# Patient Record
Sex: Female | Born: 1950 | Race: White | Hispanic: No | Marital: Married | Smoking: Never smoker
Health system: Southern US, Community
[De-identification: ages and names within clinical notes are randomized; demographics above are authoritative.]

## PROBLEM LIST (undated history)

## (undated) DIAGNOSIS — C801 Malignant (primary) neoplasm, unspecified: Secondary | ICD-10-CM

## (undated) DIAGNOSIS — M5416 Radiculopathy, lumbar region: Secondary | ICD-10-CM

## (undated) DIAGNOSIS — F329 Major depressive disorder, single episode, unspecified: Secondary | ICD-10-CM

## (undated) DIAGNOSIS — C8190 Hodgkin lymphoma, unspecified, unspecified site: Secondary | ICD-10-CM

## (undated) DIAGNOSIS — M1990 Unspecified osteoarthritis, unspecified site: Secondary | ICD-10-CM

## (undated) DIAGNOSIS — N951 Menopausal and female climacteric states: Secondary | ICD-10-CM

## (undated) DIAGNOSIS — I471 Supraventricular tachycardia, unspecified: Secondary | ICD-10-CM

## (undated) DIAGNOSIS — F5104 Psychophysiologic insomnia: Secondary | ICD-10-CM

## (undated) DIAGNOSIS — I700 Atherosclerosis of aorta: Secondary | ICD-10-CM

## (undated) DIAGNOSIS — K5792 Diverticulitis of intestine, part unspecified, without perforation or abscess without bleeding: Secondary | ICD-10-CM

## (undated) DIAGNOSIS — H269 Unspecified cataract: Secondary | ICD-10-CM

## (undated) DIAGNOSIS — E079 Disorder of thyroid, unspecified: Secondary | ICD-10-CM

## (undated) DIAGNOSIS — E039 Hypothyroidism, unspecified: Secondary | ICD-10-CM

## (undated) DIAGNOSIS — J302 Other seasonal allergic rhinitis: Secondary | ICD-10-CM

## (undated) DIAGNOSIS — G4730 Sleep apnea, unspecified: Secondary | ICD-10-CM

## (undated) DIAGNOSIS — J189 Pneumonia, unspecified organism: Secondary | ICD-10-CM

## (undated) DIAGNOSIS — F411 Generalized anxiety disorder: Secondary | ICD-10-CM

## (undated) DIAGNOSIS — K219 Gastro-esophageal reflux disease without esophagitis: Secondary | ICD-10-CM

## (undated) DIAGNOSIS — R002 Palpitations: Secondary | ICD-10-CM

## (undated) DIAGNOSIS — G4733 Obstructive sleep apnea (adult) (pediatric): Secondary | ICD-10-CM

## (undated) DIAGNOSIS — F419 Anxiety disorder, unspecified: Secondary | ICD-10-CM

## (undated) DIAGNOSIS — C439 Malignant melanoma of skin, unspecified: Secondary | ICD-10-CM

## (undated) DIAGNOSIS — J45909 Unspecified asthma, uncomplicated: Secondary | ICD-10-CM

## (undated) DIAGNOSIS — T8859XA Other complications of anesthesia, initial encounter: Secondary | ICD-10-CM

## (undated) DIAGNOSIS — N3281 Overactive bladder: Secondary | ICD-10-CM

## (undated) DIAGNOSIS — F32A Depression, unspecified: Secondary | ICD-10-CM

## (undated) DIAGNOSIS — K589 Irritable bowel syndrome without diarrhea: Secondary | ICD-10-CM

## (undated) DIAGNOSIS — R202 Paresthesia of skin: Secondary | ICD-10-CM

## (undated) DIAGNOSIS — F909 Attention-deficit hyperactivity disorder, unspecified type: Secondary | ICD-10-CM

## (undated) DIAGNOSIS — E785 Hyperlipidemia, unspecified: Secondary | ICD-10-CM

## (undated) DIAGNOSIS — C50912 Malignant neoplasm of unspecified site of left female breast: Secondary | ICD-10-CM

## (undated) DIAGNOSIS — K5909 Other constipation: Secondary | ICD-10-CM

## (undated) DIAGNOSIS — I10 Essential (primary) hypertension: Secondary | ICD-10-CM

## (undated) HISTORY — PX: COLON SURGERY: SHX602

## (undated) HISTORY — PX: CLEFT LIP REPAIR: SUR1164

## (undated) HISTORY — DX: Overactive bladder: N32.81

## (undated) HISTORY — PX: CARPAL TUNNEL RELEASE: SHX101

## (undated) HISTORY — PX: DE QUERVAIN'S RELEASE: SHX1439

## (undated) HISTORY — PX: NASAL SINUS SURGERY: SHX719

## (undated) HISTORY — PX: FINGER SURGERY: SHX640

## (undated) HISTORY — PX: CATARACT EXTRACTION: SUR2

## (undated) HISTORY — DX: Hodgkin lymphoma, unspecified, unspecified site: C81.90

## (undated) HISTORY — PX: BACK SURGERY: SHX140

## (undated) HISTORY — PX: JOINT REPLACEMENT: SHX530

## (undated) HISTORY — PX: BREAST BIOPSY: SHX20

## (undated) HISTORY — PX: EYE SURGERY: SHX253

## (undated) HISTORY — PX: OTHER SURGICAL HISTORY: SHX169

## (undated) HISTORY — PX: BLEPHAROPLASTY: SUR158

## (undated) HISTORY — PX: DIAGNOSTIC LAPAROSCOPY: SUR761

## (undated) HISTORY — DX: Menopausal and female climacteric states: N95.1

---

## 2004-05-02 DIAGNOSIS — C8190 Hodgkin lymphoma, unspecified, unspecified site: Secondary | ICD-10-CM

## 2004-05-02 HISTORY — DX: Hodgkin lymphoma, unspecified, unspecified site: C81.90

## 2013-05-02 HISTORY — PX: CATARACT EXTRACTION: SUR2

## 2017-01-30 ENCOUNTER — Emergency Department
Admission: EM | Admit: 2017-01-30 | Discharge: 2017-01-30 | Disposition: A | Discharge status: Home / Self Care | Payer: TRICARE For Life (TFL) | Attending: Student in an Organized Health Care Education/Training Program | Admitting: Student in an Organized Health Care Education/Training Program

## 2017-01-30 ENCOUNTER — Encounter: Payer: Self-pay | Admitting: Emergency Medicine

## 2017-01-30 ENCOUNTER — Emergency Department: Payer: TRICARE For Life (TFL)

## 2017-01-30 DIAGNOSIS — R079 Chest pain, unspecified: Secondary | ICD-10-CM

## 2017-01-30 DIAGNOSIS — J45909 Unspecified asthma, uncomplicated: Secondary | ICD-10-CM | POA: Diagnosis not present

## 2017-01-30 DIAGNOSIS — R590 Localized enlarged lymph nodes: Secondary | ICD-10-CM

## 2017-01-30 DIAGNOSIS — Z8571 Personal history of Hodgkin lymphoma: Secondary | ICD-10-CM | POA: Insufficient documentation

## 2017-01-30 DIAGNOSIS — E039 Hypothyroidism, unspecified: Secondary | ICD-10-CM | POA: Diagnosis not present

## 2017-01-30 HISTORY — DX: Gastro-esophageal reflux disease without esophagitis: K21.9

## 2017-01-30 HISTORY — DX: Disorder of thyroid, unspecified: E07.9

## 2017-01-30 HISTORY — DX: Major depressive disorder, single episode, unspecified: F32.9

## 2017-01-30 HISTORY — DX: Other seasonal allergic rhinitis: J30.2

## 2017-01-30 HISTORY — DX: Depression, unspecified: F32.A

## 2017-01-30 HISTORY — DX: Malignant (primary) neoplasm, unspecified: C80.1

## 2017-01-30 HISTORY — DX: Unspecified asthma, uncomplicated: J45.909

## 2017-01-30 LAB — BASIC METABOLIC PANEL
Anion gap: 10 (ref 5–15)
BUN: 10 mg/dL (ref 6–20)
CHLORIDE: 99 mmol/L — AB (ref 101–111)
CO2: 27 mmol/L (ref 22–32)
Calcium: 9.5 mg/dL (ref 8.9–10.3)
Creatinine, Ser: 0.93 mg/dL (ref 0.44–1.00)
GFR calc non Af Amer: >60 mL/min (ref >60)
Glucose, Bld: 101 mg/dL — ABNORMAL HIGH (ref 65–99)
POTASSIUM: 4.1 mmol/L (ref 3.5–5.1)
SODIUM: 136 mmol/L (ref 135–145)

## 2017-01-30 LAB — CBC
HEMATOCRIT: 39.4 % (ref 35.0–47.0)
Hemoglobin: 13.4 g/dL (ref 12.0–16.0)
MCH: 31.1 pg (ref 26.0–34.0)
MCHC: 34 g/dL (ref 32.0–36.0)
MCV: 91.3 fL (ref 80.0–100.0)
Platelets: 318 10*3/uL (ref 150–440)
RBC: 4.31 MIL/uL (ref 3.80–5.20)
RDW: 14.6 % — ABNORMAL HIGH (ref 11.5–14.5)
WBC: 6 10*3/uL (ref 3.6–11.0)

## 2017-01-30 LAB — TROPONIN I
Troponin I: <0.03 ng/mL (ref <0.03)
Troponin I: <0.03 ng/mL (ref <0.03)

## 2017-01-30 MED ORDER — IPRATROPIUM-ALBUTEROL 0.5-2.5 (3) MG/3ML IN SOLN
3.0000 mL | Freq: Once | RESPIRATORY_TRACT | Status: AC
  Administered 2017-01-30: 3 mL via RESPIRATORY_TRACT
  Filled 2017-01-30: qty 3

## 2017-01-30 MED ORDER — DEXAMETHASONE SODIUM PHOSPHATE 10 MG/ML IJ SOLN
10.0000 mg | Freq: Once | INTRAMUSCULAR | Status: AC
  Administered 2017-01-30: 10 mg via INTRAVENOUS
  Filled 2017-01-30: qty 1

## 2017-01-30 MED ORDER — SODIUM CHLORIDE 0.9 % IV BOLUS (SEPSIS)
1000.0000 mL | Freq: Once | INTRAVENOUS | Status: AC
  Administered 2017-01-30: 1000 mL via INTRAVENOUS

## 2017-01-30 MED ORDER — NYSTATIN 100000 UNIT/ML MT SUSP: 5.0000 mL | Freq: Four times a day (QID) | OROMUCOSAL | 0 refills | Status: DC

## 2017-01-30 MED ORDER — AMOXICILLIN-POT CLAVULANATE 875-125 MG PO TABS: 1.0000 | ORAL_TABLET | Freq: Two times a day (BID) | ORAL | 0 refills | Status: AC

## 2017-01-30 MED ORDER — GI COCKTAIL ~~LOC~~
30.0000 mL | Freq: Once | ORAL | Status: AC
  Administered 2017-01-30: 30 mL via ORAL
  Filled 2017-01-30: qty 30

## 2017-01-30 MED ORDER — FLUCONAZOLE 150 MG PO TABS: 150.0000 mg | ORAL_TABLET | Freq: Every day | ORAL | 0 refills | Status: AC

## 2017-01-30 MED ORDER — IOPAMIDOL (ISOVUE-370) INJECTION 76%
75.0000 mL | Freq: Once | INTRAVENOUS | Status: AC | PRN
  Administered 2017-01-30: 75 mL via INTRAVENOUS

## 2017-01-30 NOTE — ED Notes (Signed)

## 2017-01-30 NOTE — ED Provider Notes (Signed)
[facility] Emergency Department Provider Note    First MD Initiated Contact with Patient 01/30/17 1731     (approximate)  I have reviewed the triage vital signs and the nursing notes.   HISTORY  Chief Complaint Chest Pain    HPI Andrea Noble is a 66 y.o. female with a history of asthma as well as Hodgkin's lymphoma status post no biopsy and resection presents with chief complaint of left jaw pain and chest pain and left arm pain since yesterday.denies any nausea or vomiting. No abdominal pain. No extremity edema. No personal history of heart attack or DVTs. States that she had a Scientific laboratory technician send stress test 2 years ago which was negative. States she has had SVT in the past but this feels different. Denies any sore throat earache blurry vision or pain radiating through to her back.   Past Medical History:  Diagnosis Date  . Asthma   . Cancer ([place])    hodgkins lymphoma  . Depression   . GERD (gastroesophageal reflux disease)   . Seasonal allergies   . Thyroid disease    hypothyroid   No family history on file. Past Surgical History:  Procedure Laterality Date  . CATARACT EXTRACTION Bilateral    There are no active problems to display for this patient.     Prior to Admission medications   Medication Sig Start Date End Date Taking? Authorizing Provider  amoxicillin-clavulanate (AUGMENTIN) 875-125 MG tablet Take 1 tablet by mouth 2 (two) times daily. 01/30/17 02/06/17  Merlyn Lot, MD  fluconazole (DIFLUCAN) 150 MG tablet Take 1 tablet (150 mg total) by mouth daily. 01/30/17 01/31/17  Merlyn Lot, MD  nystatin (MYCOSTATIN) 100000 UNIT/ML suspension Take 5 mLs (500,000 Units total) by mouth 4 (four) times daily. 01/30/17   Merlyn Lot, MD    Allergies Levofloxacin    Social History Social History  Substance Use Topics  . Smoking status: Never Smoker  . Smokeless tobacco: Never Used  . Alcohol use No    Review of  Systems Patient denies headaches, rhinorrhea, blurry vision, numbness, shortness of breath, chest pain, edema, cough, abdominal pain, nausea, vomiting, diarrhea, dysuria, fevers, rashes or hallucinations unless otherwise stated above in HPI. ____________________________________________   PHYSICAL EXAM:  VITAL SIGNS: Vitals:   01/30/17 1353  BP: (!) 160/74  Pulse: (!) 102  Resp: 18  Temp: 98.1 F (36.7 C)  SpO2: 98%    Constitutional: Alert and oriented. Well appearing and in no acute distress. Eyes: Conjunctivae are normal.  Head: Atraumatic. Nose: No congestion/rhinnorhea. Mouth/Throat: Mucous membranes are moist.   Neck: No stridor. Painless ROM. Lymphadenopathy of left anterior cervical chain Cardiovascular: Normal rate, regular rhythm. Grossly normal heart sounds.  Good peripheral circulation. Respiratory: Normal respiratory effort.  No retractions. Lungs CTAB. Gastrointestinal: Soft and nontender. No distention. No abdominal bruits. No CVA tenderness. Genitourinary:  Musculoskeletal: No lower extremity tenderness nor edema.  No joint effusions. Neurologic:  Normal speech and language. No gross focal neurologic deficits are appreciated. No facial droop Skin:  Skin is warm, dry and intact. No rash noted. Psychiatric: Mood and affect are normal. Speech and behavior are normal.  ____________________________________________   LABS (all labs ordered are listed, but only abnormal results are displayed)  Results for orders placed or performed during the hospital encounter of 01/30/17 (from the past 24 hour(s))  Basic metabolic panel     Status: Abnormal   Collection Time: 01/30/17  1:52 PM  Result Value Ref Range  Sodium 136 135 - 145 mmol/L   Potassium 4.1 3.5 - 5.1 mmol/L   Chloride 99 (L) 101 - 111 mmol/L   CO2 27 22 - 32 mmol/L   Glucose, Bld 101 (H) 65 - 99 mg/dL   BUN 10 6 - 20 mg/dL   Creatinine, Ser 0.93 0.44 - 1.00 mg/dL   Calcium 9.5 8.9 - 10.3 mg/dL   GFR  calc non Af Amer >60 >60 mL/min   GFR calc Af Amer >60 >60 mL/min   Anion gap 10 5 - 15  CBC     Status: Abnormal   Collection Time: 01/30/17  1:52 PM  Result Value Ref Range   WBC 6.0 3.6 - 11.0 K/uL   RBC 4.31 3.80 - 5.20 MIL/uL   Hemoglobin 13.4 12.0 - 16.0 g/dL   HCT 39.4 35.0 - 47.0 %   MCV 91.3 80.0 - 100.0 fL   MCH 31.1 26.0 - 34.0 pg   MCHC 34.0 32.0 - 36.0 g/dL   RDW 14.6 (H) 11.5 - 14.5 %   Platelets 318 150 - 440 K/uL  Troponin I     Status: None   Collection Time: 01/30/17  1:52 PM  Result Value Ref Range   Troponin I <0.03 <0.03 ng/mL  Troponin I     Status: None   Collection Time: 01/30/17  6:20 PM  Result Value Ref Range   Troponin I <0.03 <0.03 ng/mL   ____________________________________________  EKG My review and personal interpretation at Time: 13:41   Indication: chest pain, n eck pain,  Rate: 110  Rhythm: sinus Axis: normal Other: normal intervals, no stemi, no depressions ____________________________________________  RADIOLOGY  I personally reviewed all radiographic images ordered to evaluate for the above acute complaints and reviewed radiology reports and findings.  These findings were personally discussed with the patient.  Please see medical record for radiology report.  ____________________________________________   PROCEDURES  Procedure(s) performed:  Procedures    Critical Care performed: no ____________________________________________   INITIAL IMPRESSION / ASSESSMENT AND PLAN / ED COURSE  Pertinent labs & imaging results that were available during my care of the patient were reviewed by me and considered in my medical decision making (see chart for details).  DDX: ACS, pericarditis, esophagitis, boerhaaves, pe, dissection, pna, bronchitis, costochondritis   Andrea Noble is a 66 y.o. who presents to the ED with shortness of breath, chest pain and arm pain as described above. EKG shows no evidence of ischemia. Her troponin is  negative. heart score is to go further risk stratify with repeat troponin. CT angiogram will be ordered due to concern for pulmonary and was MRSA dissection based on her high risk features. The lower clinical suspicion for dissection based on duration of the events and her well appearance.  seems less consistent with pneumonia but could have component of asthma versus bronchitis.  The patient will be placed on continuous pulse oximetry and telemetry for monitoring.  Laboratory evaluation will be sent to evaluate for the above complaints.     Clinical Course as of Jan 31 2012  Mon Jan 30, 2017  1959 patient reassessed and is chest pain-free. Repeat troponin is negative. At this point do not feel this is representative ACS dissection or acute intrathoracic process requiring admission to hospital. I did offer admission to hospital for further risk stratification and observation of chest pain patient has declined this or prefer to be referred as does an outpatient. On reassessment she is complaining of some tender adenopathy  in the left anterior neck which is also where she previously had a history of lymphoma. There is no evidence of infectious process right now. I will give her referral to heme on for further surveillance given her history of lymphoma. Patient states that she did feel improvement in her symptoms after  [PR]    Clinical Course User Index [PR] Merlyn Lot, MD     ____________________________________________   FINAL CLINICAL IMPRESSION(S) / ED DIAGNOSES  Final diagnoses:  Nonspecific chest pain  Lymphadenopathy of left cervical region      NEW MEDICATIONS STARTED DURING THIS VISIT:  New Prescriptions   AMOXICILLIN-CLAVULANATE (AUGMENTIN) 875-125 MG TABLET    Take 1 tablet by mouth 2 (two) times daily.   FLUCONAZOLE (DIFLUCAN) 150 MG TABLET    Take 1 tablet (150 mg total) by mouth daily.   NYSTATIN (MYCOSTATIN) 100000 UNIT/ML SUSPENSION    Take 5 mLs (500,000 Units  total) by mouth 4 (four) times daily.     Note:  This document was prepared using Dragon voice recognition software and may include unintentional dictation errors.    Merlyn Lot, MD 01/30/17 2013

## 2017-01-30 NOTE — ED Notes (Signed)
Unable to obtain E-signature; E-sign pad in the room is not working; hard copy printed and signed by patient.

## 2017-01-30 NOTE — ED Triage Notes (Signed)
C/O mid chest pain radiating to left jaw and left arm x 1 day.  Patient states pain was worse last night, but today dull pain persists.  Patient ahs had SVT in the past and has + familial history of heart disease.

## 2017-01-30 NOTE — ED Notes (Signed)
Pt went to CT

## 2017-03-01 DIAGNOSIS — Z8571 Personal history of Hodgkin lymphoma: Secondary | ICD-10-CM | POA: Insufficient documentation

## 2017-03-01 DIAGNOSIS — Z87898 Personal history of other specified conditions: Secondary | ICD-10-CM | POA: Insufficient documentation

## 2017-03-01 NOTE — Progress Notes (Signed)
[place]  [phone] [phone]) [phone]  ID: [name] OB: [birth year]  MR#: [id number]  JEH#:[id number]  Patient Care Team: Glendon Axe, MD as PCP - General (Internal Medicine)  CHIEF COMPLAINT: History of Hodgkin's lymphoma.  INTERVAL HISTORY: Patient is a 66 year old female who was diagnosed with Hodgkin's lymphoma from a neck lymph node biopsy in December 2006.  She received chemotherapy with ABVD starting in December 2006 and completing in March 2007.  She also had adjuvant XRT in May and June 2007.  She had multiple PET scans after revealing complete remission.  She recently moved to [place] from [place] where all of her treatments and imaging took place.  She was in the emergency room with chest pain and shortness of breath, but  PE protocol CT did not reveal distinct etiology.  She admits to night sweats, but otherwise feels well.  She has no neurologic complaints.  She denies any recent fevers or illnesses.  She has a good appetite and denies weight loss.  She has noted no new lymphadenopathy.  She has no further chest pain or shortness of breath.  She denies any nausea, vomiting, constipation, or diarrhea.  She has no urinary complaints.  Patient otherwise feels well and offers no further specific complaints today.  REVIEW OF SYSTEMS:   Review of Systems  Constitutional: Positive for diaphoresis. Negative for fever, malaise/fatigue and weight loss.  Respiratory: Negative.  Negative for cough and shortness of breath.   Cardiovascular: Negative.  Negative for chest pain and leg swelling.  Gastrointestinal: Negative.  Negative for abdominal pain, blood in stool and melena.  Genitourinary: Negative.   Musculoskeletal: Negative.   Skin: Negative.  Negative for rash.  Neurological: Negative.  Negative for sensory change and weakness.  Endo/Heme/Allergies: Negative.  Does not bruise/bleed easily.  Psychiatric/Behavioral: Negative.  The  patient is not nervous/anxious.     As per HPI. Otherwise, a complete review of systems is negative.  PAST MEDICAL HISTORY: Past Medical History:  Diagnosis Date  . Asthma   . Cancer ([place])    hodgkins lymphoma  . Depression   . GERD (gastroesophageal reflux disease)   . Seasonal allergies   . Thyroid disease    hypothyroid    PAST SURGICAL HISTORY: Past Surgical History:  Procedure Laterality Date  . CATARACT EXTRACTION Bilateral     FAMILY HISTORY: No family history on file.  ADVANCED DIRECTIVES (Y/N):  N  HEALTH MAINTENANCE: Social History   Tobacco Use  . Smoking status: Never Smoker  . Smokeless tobacco: Never Used  Substance Use Topics  . Alcohol use: No  . Drug use: No     Colonoscopy:  PAP:  Bone density:  Lipid panel:  Allergies  Allergen Reactions  . Levofloxacin     IV Levaquin causes burning  . Other Itching    Surgical glue    Current Outpatient Medications  Medication Sig Dispense Refill  . ADVAIR DISKUS 100-50 MCG/DOSE AEPB     . albuterol (PROAIR HFA) 108 (90 Base) MCG/ACT inhaler ProAir HFA 90 mcg/actuation aerosol inhaler   2 puffs as needed by inhalation route.    Marland Kitchen buPROPion (WELLBUTRIN SR) 200 MG 12 hr tablet bupropion HCl SR 200 mg tablet,12 hr sustained-release    . calcium-vitamin D (OSCAL WITH D) 250-125 MG-UNIT tablet Take 1 tablet daily by mouth.    . conjugated estrogens (PREMARIN) vaginal cream Premarin 0.625 mg/gram vaginal cream  PRN    . diflorasone-emollient (APEXICON E) 0.05 % CREA  ApexiCon E 0.05 % topical cream    . DOCOSAHEXAENOIC ACID PO Take by mouth.    . doxycycline (VIBRAMYCIN) 100 MG capsule Take 100 mg 2 (two) times daily by mouth.    . fluocinonide cream (LIDEX) 0.05 % fluocinonide 0.05 % topical cream    . fluticasone (FLONASE) 50 MCG/ACT nasal spray Flonase Allergy Relief 50 mcg/actuation nasal spray,suspension   2 sprays every day by nasal route.    . gabapentin (NEURONTIN) 100 MG capsule gabapentin 100  mg capsule    . Inulin 2 g CHEW Fiber Gummies 2 gram chewable tablet   1 tablet every day by oral route.    Marland Kitchen levothyroxine (SYNTHROID, LEVOTHROID) 50 MCG tablet levothyroxine 50 mcg tablet    . Meclizine HCl 25 MG CHEW Meclizine Hcl Chewable 25 mg tablet   1 tablet as needed by oral route.    . montelukast (SINGULAIR) 10 MG tablet montelukast 10 mg tablet    . Multiple Vitamin (MULTIVITAMIN) tablet Take 1 tablet daily by mouth.    . pantoprazole (PROTONIX) 40 MG tablet pantoprazole 40 mg tablet,delayed release  DAILY    . pravastatin (PRAVACHOL) 40 MG tablet pravastatin 40 mg tablet    . PREMPRO 0.45-1.5 MG tablet     . Probiotic Product (SOLUBLE FIBER/PROBIOTICS PO) Take by mouth.    . tetrahydrozoline-zinc (VISINE-AC) 0.05-0.25 % ophthalmic solution Allergy Relief Eye Drops 0.05 %-0.25 %   1 drop every day by ophthalmic route.    . traZODone (DESYREL) 50 MG tablet trazodone 50 mg tablet  DAILY    . Triprolidine-Pseudoephedrine (ANTIHISTAMINE PO) Take by mouth.     No current facility-administered medications for this visit.     OBJECTIVE: Vitals:   03/06/17 1604  BP: (!) 147/86  Pulse: (!) 111  Resp: 20  Temp: (!) 97.4 F (36.3 C)     Body mass index is 25.58 kg/m.    ECOG FS:0 - Asymptomatic  General: Well-developed, well-nourished, no acute distress. Eyes: Pink conjunctiva, anicteric sclera. HEENT: Normocephalic, moist mucous membranes, clear oropharnyx. Lungs: Clear to auscultation bilaterally. Heart: Regular rate and rhythm. No rubs, murmurs, or gallops. Abdomen: Soft, nontender, nondistended. No organomegaly noted, normoactive bowel sounds. Musculoskeletal: No edema, cyanosis, or clubbing. Neuro: Alert, answering all questions appropriately. Cranial nerves grossly intact. Skin: No rashes or petechiae noted. Psych: Normal affect. Lymphatics: No cervical, calvicular, axillary or inguinal LAD.   LAB RESULTS:  Lab Results  Component Value Date   NA 136 01/30/2017    K 4.1 01/30/2017   CL 99 (L) 01/30/2017   CO2 27 01/30/2017   GLUCOSE 101 (H) 01/30/2017   BUN 10 01/30/2017   CREATININE 0.93 01/30/2017   CALCIUM 9.5 01/30/2017   GFRNONAA >60 01/30/2017   GFRAA >60 01/30/2017    Lab Results  Component Value Date   WBC 6.6 03/06/2017   NEUTROABS 4.4 03/06/2017   HGB 12.9 03/06/2017   HCT 38.1 03/06/2017   MCV 91.8 03/06/2017   PLT 323 03/06/2017     STUDIES: No results found.  ASSESSMENT: History of Hodgkin's lymphoma.  PLAN:    1. History of Hodgkin's lymphoma: Patient was initially diagnosed with Hodgkin's lymphoma from a neck lymph node biopsy in December 2006.  Possibly stage II.  She received chemotherapy with ABVD starting in December 2006 and completing in March 2007.  She also had adjuvant XRT in May and June 2007.  She had multiple PET scans after completing her treatments revealing complete remission. She recently moved to  [place] from [place] where all of her treatments and imaging took place.  CT scan of the chest completed in the emergency room did not reveal any evidence of recurrence.  Will get a CT of her neck, abdomen and pelvis for completeness.  Patient will follow up 1-2 days after her imaging to discuss the results.  If the results are negative, she likely can be discharged from clinic.  Approximately 45 minutes was spent in discussion of which greater than 50% was consultation.  Patient expressed understanding and was in agreement with this plan. She also understands that She can call clinic at any time with any questions, concerns, or complaints.   Cancer Staging No matching staging information was found for the patient.  Lloyd Huger, MD   03/06/2017 4:08 PM

## 2017-03-02 ENCOUNTER — Other Ambulatory Visit: Payer: Self-pay | Admitting: Oncology

## 2017-03-02 ENCOUNTER — Ambulatory Visit
Admission: RE | Admit: 2017-03-02 | Discharge: 2017-03-02 | Disposition: A | Discharge status: Home / Self Care | Payer: Self-pay | Source: Ambulatory Visit | Attending: Oncology | Admitting: Oncology

## 2017-03-02 DIAGNOSIS — C8190 Hodgkin lymphoma, unspecified, unspecified site: Secondary | ICD-10-CM

## 2017-03-06 ENCOUNTER — Inpatient Hospital Stay: Payer: TRICARE For Life (TFL) | Attending: Oncology | Admitting: Oncology

## 2017-03-06 ENCOUNTER — Inpatient Hospital Stay: Payer: TRICARE For Life (TFL)

## 2017-03-06 VITALS — BP 147/86 | HR 111 | Temp 97.4°F | Resp 20 | Wt 149.0 lb

## 2017-03-06 DIAGNOSIS — Z923 Personal history of irradiation: Secondary | ICD-10-CM | POA: Diagnosis not present

## 2017-03-06 DIAGNOSIS — I700 Atherosclerosis of aorta: Secondary | ICD-10-CM | POA: Diagnosis not present

## 2017-03-06 DIAGNOSIS — Z9221 Personal history of antineoplastic chemotherapy: Secondary | ICD-10-CM

## 2017-03-06 DIAGNOSIS — M542 Cervicalgia: Secondary | ICD-10-CM | POA: Diagnosis not present

## 2017-03-06 DIAGNOSIS — R1310 Dysphagia, unspecified: Secondary | ICD-10-CM | POA: Insufficient documentation

## 2017-03-06 DIAGNOSIS — M47816 Spondylosis without myelopathy or radiculopathy, lumbar region: Secondary | ICD-10-CM | POA: Diagnosis not present

## 2017-03-06 DIAGNOSIS — K219 Gastro-esophageal reflux disease without esophagitis: Secondary | ICD-10-CM

## 2017-03-06 DIAGNOSIS — F329 Major depressive disorder, single episode, unspecified: Secondary | ICD-10-CM

## 2017-03-06 DIAGNOSIS — Z8571 Personal history of Hodgkin lymphoma: Secondary | ICD-10-CM | POA: Diagnosis not present

## 2017-03-06 DIAGNOSIS — Z79899 Other long term (current) drug therapy: Secondary | ICD-10-CM

## 2017-03-06 DIAGNOSIS — J45909 Unspecified asthma, uncomplicated: Secondary | ICD-10-CM

## 2017-03-06 DIAGNOSIS — E039 Hypothyroidism, unspecified: Secondary | ICD-10-CM | POA: Diagnosis not present

## 2017-03-06 DIAGNOSIS — R61 Generalized hyperhidrosis: Secondary | ICD-10-CM | POA: Diagnosis not present

## 2017-03-06 LAB — CBC WITH DIFFERENTIAL/PLATELET
BASOS PCT: 0 %
Basophils Absolute: 0 10*3/uL (ref 0–0.1)
EOS ABS: 0.1 10*3/uL (ref 0–0.7)
EOS PCT: 1 %
HCT: 38.1 % (ref 35.0–47.0)
HEMOGLOBIN: 12.9 g/dL (ref 12.0–16.0)
LYMPHS ABS: 1.5 10*3/uL (ref 1.0–3.6)
Lymphocytes Relative: 23 %
MCH: 31 pg (ref 26.0–34.0)
MCHC: 33.8 g/dL (ref 32.0–36.0)
MCV: 91.8 fL (ref 80.0–100.0)
MONO ABS: 0.6 10*3/uL (ref 0.2–0.9)
MONOS PCT: 9 %
Neutro Abs: 4.4 10*3/uL (ref 1.4–6.5)
Neutrophils Relative %: 67 %
PLATELETS: 323 10*3/uL (ref 150–440)
RBC: 4.15 MIL/uL (ref 3.80–5.20)
RDW: 13.4 % (ref 11.5–14.5)
WBC: 6.6 10*3/uL (ref 3.6–11.0)

## 2017-03-06 LAB — LACTATE DEHYDROGENASE: LDH: 156 U/L (ref 98–192)

## 2017-03-06 NOTE — Progress Notes (Signed)
Patient here today for initial evaluation regarding history of Hodgkins Lymphoma.

## 2017-03-08 ENCOUNTER — Ambulatory Visit
Admission: RE | Admit: 2017-03-08 | Discharge: 2017-03-08 | Disposition: A | Discharge status: Home / Self Care | Payer: TRICARE For Life (TFL) | Source: Ambulatory Visit | Attending: Oncology | Admitting: Oncology

## 2017-03-08 DIAGNOSIS — I700 Atherosclerosis of aorta: Secondary | ICD-10-CM | POA: Diagnosis not present

## 2017-03-08 DIAGNOSIS — Z8571 Personal history of Hodgkin lymphoma: Secondary | ICD-10-CM

## 2017-03-08 MED ORDER — IOPAMIDOL (ISOVUE-300) INJECTION 61%
100.0000 mL | Freq: Once | INTRAVENOUS | Status: AC | PRN
  Administered 2017-03-08: 100 mL via INTRAVENOUS

## 2017-03-12 NOTE — Progress Notes (Signed)
[facility]  [phone]) [phone] [phone]) [phone]  ID: Andrea Noble OB: 05-10-1950  MR#: [id number]  EXN#:[id number]  Patient Care Team: Andrea Axe, MD as PCP - General (Internal Medicine)  CHIEF COMPLAINT: History of Hodgkin's lymphoma.  INTERVAL HISTORY: Patient returns to clinic today for further evaluation and discussion of her imaging results.  She currently feels well and is asymptomatic. She has no neurologic complaints.  She denies any recent fevers or illnesses.  She has a good appetite and denies weight loss.  She has noted no new lymphadenopathy.  She has no further chest pain or shortness of breath.  She denies any nausea, vomiting, constipation, or diarrhea.  She has no urinary complaints.  Patient offers no specific complaints today.  REVIEW OF SYSTEMS:   Review of Systems  Constitutional: Negative.  Negative for diaphoresis, fever, malaise/fatigue and weight loss.  Respiratory: Negative.  Negative for cough and shortness of breath.   Cardiovascular: Negative.  Negative for chest pain and leg swelling.  Gastrointestinal: Negative.  Negative for abdominal pain, blood in stool and melena.  Genitourinary: Negative.   Musculoskeletal: Negative.   Skin: Negative.  Negative for rash.  Neurological: Negative.  Negative for sensory change and weakness.  Endo/Heme/Allergies: Negative.  Does not bruise/bleed easily.  Psychiatric/Behavioral: Negative.  The patient is not nervous/anxious.     As per HPI. Otherwise, a complete review of systems is negative.  PAST MEDICAL HISTORY: Past Medical History:  Diagnosis Date  . Asthma   . Cancer ([place])    hodgkins lymphoma  . Depression   . GERD (gastroesophageal reflux disease)   . Seasonal allergies   . Thyroid disease    hypothyroid    PAST SURGICAL HISTORY: Past Surgical History:  Procedure Laterality Date  . CATARACT EXTRACTION Bilateral     FAMILY HISTORY: No family history on  file.  ADVANCED DIRECTIVES (Y/N):  N  HEALTH MAINTENANCE: Social History   Tobacco Use  . Smoking status: Never Smoker  . Smokeless tobacco: Never Used  Substance Use Topics  . Alcohol use: No  . Drug use: No     Colonoscopy:  PAP:  Bone density:  Lipid panel:  Allergies  Allergen Reactions  . Levofloxacin     IV Levaquin causes burning  . Other Itching    Surgical glue    Current Outpatient Medications  Medication Sig Dispense Refill  . ADVAIR DISKUS 100-50 MCG/DOSE AEPB     . albuterol (PROAIR HFA) 108 (90 Base) MCG/ACT inhaler ProAir HFA 90 mcg/actuation aerosol inhaler   2 puffs as needed by inhalation route.    Marland Kitchen buPROPion (WELLBUTRIN SR) 200 MG 12 hr tablet bupropion HCl SR 200 mg tablet,12 hr sustained-release    . calcium-vitamin D (OSCAL WITH D) 250-125 MG-UNIT tablet Take 1 tablet daily by mouth.    . conjugated estrogens (PREMARIN) vaginal cream Premarin 0.625 mg/gram vaginal cream  PRN    . diflorasone-emollient (APEXICON E) 0.05 % CREA ApexiCon E 0.05 % topical cream    . DOCOSAHEXAENOIC ACID PO Take by mouth.    . doxycycline (VIBRAMYCIN) 100 MG capsule Take 100 mg 2 (two) times daily by mouth.    . fluocinonide cream (LIDEX) 0.05 % fluocinonide 0.05 % topical cream    . fluticasone (FLONASE) 50 MCG/ACT nasal spray Flonase Allergy Relief 50 mcg/actuation nasal spray,suspension   2 sprays every day by nasal route.    . gabapentin (NEURONTIN) 100 MG capsule gabapentin 100 mg capsule    . Inulin  2 g CHEW Fiber Gummies 2 gram chewable tablet   1 tablet every day by oral route.    Marland Kitchen levothyroxine (SYNTHROID, LEVOTHROID) 50 MCG tablet levothyroxine 50 mcg tablet    . Meclizine HCl 25 MG CHEW Meclizine Hcl Chewable 25 mg tablet   1 tablet as needed by oral route.    . montelukast (SINGULAIR) 10 MG tablet montelukast 10 mg tablet    . Multiple Vitamin (MULTIVITAMIN) tablet Take 1 tablet daily by mouth.    . pantoprazole (PROTONIX) 40 MG tablet pantoprazole 40  mg tablet,delayed release  DAILY    . pravastatin (PRAVACHOL) 40 MG tablet pravastatin 40 mg tablet    . PREMPRO 0.45-1.5 MG tablet     . Probiotic Product (SOLUBLE FIBER/PROBIOTICS PO) Take by mouth.    . tetrahydrozoline-zinc (VISINE-AC) 0.05-0.25 % ophthalmic solution Allergy Relief Eye Drops 0.05 %-0.25 %   1 drop every day by ophthalmic route.    . traZODone (DESYREL) 50 MG tablet trazodone 50 mg tablet  DAILY    . Triprolidine-Pseudoephedrine (ANTIHISTAMINE PO) Take by mouth.     No current facility-administered medications for this visit.     OBJECTIVE: Vitals:   03/13/17 1022  BP: (!) 152/82  Pulse: (!) 104  Resp: 18  Temp: 97.9 F (36.6 C)     Body mass index is 26.29 kg/m.    ECOG FS:0 - Asymptomatic  General: Well-developed, well-nourished, no acute distress. Eyes: Pink conjunctiva, anicteric sclera. HEENT: Normocephalic, moist mucous membranes, clear oropharnyx. Lungs: Clear to auscultation bilaterally. Heart: Regular rate and rhythm. No rubs, murmurs, or gallops. Abdomen: Soft, nontender, nondistended. No organomegaly noted, normoactive bowel sounds. Musculoskeletal: No edema, cyanosis, or clubbing. Neuro: Alert, answering all questions appropriately. Cranial nerves grossly intact. Skin: No rashes or petechiae noted. Psych: Normal affect. Lymphatics: No cervical, calvicular, axillary or inguinal LAD.   LAB RESULTS:  Lab Results  Component Value Date   NA 136 01/30/2017   K 4.1 01/30/2017   CL 99 (L) 01/30/2017   CO2 27 01/30/2017   GLUCOSE 101 (H) 01/30/2017   BUN 10 01/30/2017   CREATININE 0.93 01/30/2017   CALCIUM 9.5 01/30/2017   GFRNONAA >60 01/30/2017   GFRAA >60 01/30/2017    Lab Results  Component Value Date   WBC 6.6 03/06/2017   NEUTROABS 4.4 03/06/2017   HGB 12.9 03/06/2017   HCT 38.1 03/06/2017   MCV 91.8 03/06/2017   PLT 323 03/06/2017     STUDIES: Ct Soft Tissue Neck W Contrast  Result Date: 03/08/2017 CLINICAL DATA:  Neck  pain for 1 month. Occasional difficulty swallowing. History of Hodgkin's lymphoma. EXAM: CT NECK WITH CONTRAST TECHNIQUE: Multidetector CT imaging of the neck was performed using the standard protocol following the bolus administration of intravenous contrast. CONTRAST:  180mL ISOVUE-300 IOPAMIDOL (ISOVUE-300) INJECTION 61% COMPARISON:  None. FINDINGS: Pharynx and larynx: No evidence of mass. No retropharyngeal fluid collection or significant inflammatory change. Small tonsillar calcifications, greater on the left. Salivary glands: No inflammation, mass, or stone. Thyroid: Unremarkable. Lymph nodes: No enlarged or suspicious lymph nodes in the neck. Vascular: Major vascular structures of the neck appear patent. Mild calcified plaque in the proximal left ICA. Limited intracranial: Unremarkable. Visualized orbits: Bilateral cataract extraction. Mastoids and visualized paranasal sinuses:  Clear. Skeleton: Mild-to-moderate diffuse cervical facet arthrosis. Grade 1 anterolisthesis of C4 on C5. No suspicious lytic or blastic osseous lesion. Upper chest: Minimal scarring and atelectasis in the included lungs. Other: None. IMPRESSION: No evidence of acute abnormality or  recurrent lymphoma in the neck. Electronically Signed   By: Logan Bores M.D.   On: 03/08/2017 14:11   Ct Abdomen Pelvis W Contrast  Result Date: 03/08/2017 CLINICAL DATA:  History of Hodgkin's lymphoma in 2006 status post chemotherapy and radiation therapy with complete remission. Intermittent generalized abdominal pain. Restaging. EXAM: CT ABDOMEN AND PELVIS WITH CONTRAST TECHNIQUE: Multidetector CT imaging of the abdomen and pelvis was performed using the standard protocol following bolus administration of intravenous contrast. CONTRAST:  172mL ISOVUE-300 IOPAMIDOL (ISOVUE-300) INJECTION 61% COMPARISON:  None. FINDINGS: Lower chest: No significant pulmonary nodules or acute consolidative airspace disease. Small amount of oral contrast layering in the  lower thoracic esophagus. Hepatobiliary: Normal liver size. No liver masses. Normal gallbladder with no radiopaque cholelithiasis. No biliary ductal dilatation. Pancreas: Heterogeneous fatty infiltration of the pancreatic head and neck. No discrete pancreatic mass. No pancreatic duct dilation. Spleen: Normal size. No mass. Adrenals/Urinary Tract: Normal adrenals. Normal kidneys with no hydronephrosis and no renal mass. Normal bladder. Stomach/Bowel: Grossly normal stomach. Normal caliber small bowel with no small bowel wall thickening. Normal appendix. Moderate colonic stool volume. No large bowel wall thickening, significant diverticulosis or pericolonic fat stranding. Vascular/Lymphatic: Atherosclerotic nonaneurysmal abdominal aorta. Patent portal, splenic, hepatic and renal veins. No pathologically enlarged lymph nodes in the abdomen or pelvis. Reproductive: No adnexal masses. Normal size anteverted uterus without acute abnormality. Other: No pneumoperitoneum, ascites or focal fluid collection. Musculoskeletal: No aggressive appearing focal osseous lesions. Moderate thoracolumbar spondylosis, most prominent at L3-4. IMPRESSION: 1. No evidence of recurrent lymphoma in the abdomen or pelvis. 2. Small mild oral contrast in the lower thoracic esophagus, suggesting esophageal dysmotility and/or gastroesophageal reflux. 3. Moderate colonic stool volume, which may indicate constipation. No evidence of bowel obstruction or acute bowel inflammation. 4.  Aortic Atherosclerosis (ICD10-I70.0). Electronically Signed   By: Ilona Sorrel M.D.   On: 03/08/2017 14:35    ASSESSMENT: History of Hodgkin's lymphoma.  PLAN:    1. History of Hodgkin's lymphoma: Patient was initially diagnosed with Hodgkin's lymphoma from a neck lymph node biopsy in December 2006.  Possibly stage II.  She received chemotherapy with ABVD starting in December 2006 and completing in March 2007.  She also had adjuvant XRT in May and June 2007.  She  had multiple PET scans after completing her treatments revealing complete remission. She recently moved to [place] from [place] where all of her treatments and imaging took place.  CT scans of the neck, chest, abdomen, pelvis completed over the last 1-2 weeks revealed no evidence of recurrence.  No intervention is needed.  No follow-up is scheduled. 2.  Primary care: Patient states she has her initial primary care visit tomorrow.    Approximately 20 minutes was spent in discussion of which greater than 50% was consultation.  Patient expressed understanding and was in agreement with this plan. She also understands that She can call clinic at any time with any questions, concerns, or complaints.    Lloyd Huger, MD   03/13/2017 10:45 AM

## 2017-03-13 ENCOUNTER — Inpatient Hospital Stay (HOSPITAL_BASED_OUTPATIENT_CLINIC_OR_DEPARTMENT_OTHER): Payer: TRICARE For Life (TFL) | Admitting: Oncology

## 2017-03-13 VITALS — BP 152/82 | HR 104 | Temp 97.9°F | Resp 18 | Wt 153.2 lb

## 2017-03-13 DIAGNOSIS — I700 Atherosclerosis of aorta: Secondary | ICD-10-CM | POA: Diagnosis not present

## 2017-03-13 DIAGNOSIS — K219 Gastro-esophageal reflux disease without esophagitis: Secondary | ICD-10-CM

## 2017-03-13 DIAGNOSIS — M47816 Spondylosis without myelopathy or radiculopathy, lumbar region: Secondary | ICD-10-CM

## 2017-03-13 DIAGNOSIS — Z9221 Personal history of antineoplastic chemotherapy: Secondary | ICD-10-CM | POA: Diagnosis not present

## 2017-03-13 DIAGNOSIS — J45909 Unspecified asthma, uncomplicated: Secondary | ICD-10-CM | POA: Diagnosis not present

## 2017-03-13 DIAGNOSIS — R61 Generalized hyperhidrosis: Secondary | ICD-10-CM

## 2017-03-13 DIAGNOSIS — Z8571 Personal history of Hodgkin lymphoma: Secondary | ICD-10-CM

## 2017-03-13 DIAGNOSIS — F329 Major depressive disorder, single episode, unspecified: Secondary | ICD-10-CM

## 2017-03-13 DIAGNOSIS — R1310 Dysphagia, unspecified: Secondary | ICD-10-CM

## 2017-03-13 DIAGNOSIS — M542 Cervicalgia: Secondary | ICD-10-CM

## 2017-03-13 DIAGNOSIS — E039 Hypothyroidism, unspecified: Secondary | ICD-10-CM

## 2017-03-13 DIAGNOSIS — Z923 Personal history of irradiation: Secondary | ICD-10-CM

## 2017-03-13 DIAGNOSIS — Z79899 Other long term (current) drug therapy: Secondary | ICD-10-CM

## 2017-03-13 NOTE — Progress Notes (Signed)
Pt in today for follow up and test results.  Reports feeling well.  Denies any concerns at this time.

## 2017-03-14 ENCOUNTER — Ambulatory Visit
Admission: RE | Admit: 2017-03-14 | Discharge: 2017-03-14 | Disposition: A | Discharge status: Home / Self Care | Payer: Self-pay | Source: Ambulatory Visit | Attending: Oncology | Admitting: Oncology

## 2017-03-14 ENCOUNTER — Other Ambulatory Visit: Payer: Self-pay | Admitting: Oncology

## 2017-03-14 DIAGNOSIS — K219 Gastro-esophageal reflux disease without esophagitis: Secondary | ICD-10-CM | POA: Insufficient documentation

## 2017-03-14 DIAGNOSIS — F331 Major depressive disorder, recurrent, moderate: Secondary | ICD-10-CM | POA: Insufficient documentation

## 2017-03-14 DIAGNOSIS — C8190 Hodgkin lymphoma, unspecified, unspecified site: Secondary | ICD-10-CM

## 2017-03-14 DIAGNOSIS — F325 Major depressive disorder, single episode, in full remission: Secondary | ICD-10-CM | POA: Insufficient documentation

## 2017-03-14 DIAGNOSIS — E782 Mixed hyperlipidemia: Secondary | ICD-10-CM | POA: Insufficient documentation

## 2017-03-14 DIAGNOSIS — E7800 Pure hypercholesterolemia, unspecified: Secondary | ICD-10-CM | POA: Insufficient documentation

## 2017-03-22 ENCOUNTER — Ambulatory Visit
Admission: RE | Admit: 2017-03-22 | Discharge: 2017-03-22 | Disposition: A | Discharge status: Home / Self Care | Payer: Self-pay | Source: Ambulatory Visit | Attending: Oncology | Admitting: Oncology

## 2017-03-22 ENCOUNTER — Other Ambulatory Visit: Payer: Self-pay | Admitting: Oncology

## 2017-03-22 DIAGNOSIS — C8190 Hodgkin lymphoma, unspecified, unspecified site: Secondary | ICD-10-CM

## 2017-05-15 DIAGNOSIS — F5104 Psychophysiologic insomnia: Secondary | ICD-10-CM | POA: Insufficient documentation

## 2017-05-15 DIAGNOSIS — J4540 Moderate persistent asthma, uncomplicated: Secondary | ICD-10-CM | POA: Insufficient documentation

## 2017-05-15 DIAGNOSIS — N951 Menopausal and female climacteric states: Secondary | ICD-10-CM | POA: Insufficient documentation

## 2017-05-16 ENCOUNTER — Other Ambulatory Visit: Payer: Self-pay | Admitting: Internal Medicine

## 2017-05-16 DIAGNOSIS — Z1239 Encounter for other screening for malignant neoplasm of breast: Secondary | ICD-10-CM

## 2017-06-30 ENCOUNTER — Inpatient Hospital Stay
Admission: RE | Admit: 2017-06-30 | Discharge: 2017-06-30 | Disposition: A | Discharge status: Home / Self Care | Payer: Self-pay | Source: Ambulatory Visit | Attending: *Deleted | Admitting: *Deleted

## 2017-06-30 ENCOUNTER — Other Ambulatory Visit: Payer: Self-pay | Admitting: *Deleted

## 2017-06-30 DIAGNOSIS — Z9289 Personal history of other medical treatment: Secondary | ICD-10-CM

## 2017-07-10 ENCOUNTER — Ambulatory Visit
Admission: RE | Admit: 2017-07-10 | Discharge: 2017-07-10 | Disposition: A | Discharge status: Home / Self Care | Source: Ambulatory Visit | Attending: Internal Medicine | Admitting: Internal Medicine

## 2017-07-10 DIAGNOSIS — Z1231 Encounter for screening mammogram for malignant neoplasm of breast: Secondary | ICD-10-CM | POA: Insufficient documentation

## 2017-07-10 DIAGNOSIS — Z1239 Encounter for other screening for malignant neoplasm of breast: Secondary | ICD-10-CM

## 2017-07-31 ENCOUNTER — Emergency Department

## 2017-07-31 ENCOUNTER — Emergency Department
Admission: EM | Admit: 2017-07-31 | Discharge: 2017-07-31 | Disposition: A | Discharge status: Home / Self Care | Attending: Student in an Organized Health Care Education/Training Program | Admitting: Student in an Organized Health Care Education/Training Program

## 2017-07-31 ENCOUNTER — Other Ambulatory Visit: Payer: Self-pay

## 2017-07-31 ENCOUNTER — Encounter: Payer: Self-pay | Admitting: *Deleted

## 2017-07-31 DIAGNOSIS — Z79899 Other long term (current) drug therapy: Secondary | ICD-10-CM | POA: Insufficient documentation

## 2017-07-31 DIAGNOSIS — J45909 Unspecified asthma, uncomplicated: Secondary | ICD-10-CM | POA: Diagnosis not present

## 2017-07-31 DIAGNOSIS — E039 Hypothyroidism, unspecified: Secondary | ICD-10-CM | POA: Insufficient documentation

## 2017-07-31 DIAGNOSIS — R42 Dizziness and giddiness: Secondary | ICD-10-CM | POA: Diagnosis present

## 2017-07-31 DIAGNOSIS — R51 Headache: Secondary | ICD-10-CM | POA: Diagnosis not present

## 2017-07-31 DIAGNOSIS — S0990XA Unspecified injury of head, initial encounter: Secondary | ICD-10-CM

## 2017-07-31 LAB — URINALYSIS, COMPLETE (UACMP) WITH MICROSCOPIC
BILIRUBIN URINE: NEGATIVE
GLUCOSE, UA: NEGATIVE mg/dL
HGB URINE DIPSTICK: NEGATIVE
Ketones, ur: NEGATIVE mg/dL
LEUKOCYTES UA: NEGATIVE
NITRITE: NEGATIVE
PH: 7 (ref 5.0–8.0)
PROTEIN: NEGATIVE mg/dL
Specific Gravity, Urine: 1.003 — ABNORMAL LOW (ref 1.005–1.030)
Squamous Epithelial / LPF: NONE SEEN

## 2017-07-31 LAB — BASIC METABOLIC PANEL
ANION GAP: 8 (ref 5–15)
BUN: 18 mg/dL (ref 6–20)
CO2: 27 mmol/L (ref 22–32)
Calcium: 9.5 mg/dL (ref 8.9–10.3)
Chloride: 101 mmol/L (ref 101–111)
Creatinine, Ser: 0.75 mg/dL (ref 0.44–1.00)
Glucose, Bld: 93 mg/dL (ref 65–99)
Potassium: 4.2 mmol/L (ref 3.5–5.1)
SODIUM: 136 mmol/L (ref 135–145)

## 2017-07-31 LAB — CBC
HCT: 38.4 % (ref 35.0–47.0)
HEMOGLOBIN: 12.9 g/dL (ref 12.0–16.0)
MCH: 30.5 pg (ref 26.0–34.0)
MCHC: 33.7 g/dL (ref 32.0–36.0)
MCV: 90.4 fL (ref 80.0–100.0)
PLATELETS: 292 10*3/uL (ref 150–440)
RBC: 4.25 MIL/uL (ref 3.80–5.20)
RDW: 13.7 % (ref 11.5–14.5)
WBC: 5.1 10*3/uL (ref 3.6–11.0)

## 2017-07-31 LAB — TROPONIN I: Troponin I: <0.03 ng/mL (ref <0.03)

## 2017-07-31 NOTE — ED Provider Notes (Signed)
[facility] Emergency Department Provider Note    First MD Initiated Contact with Patient 07/31/17 1710     (approximate)  I have reviewed the triage vital signs and the nursing notes.   HISTORY  Chief Complaint Fall    HPI Andrea Noble is a 67 y.o. female presents to the ER for evaluation of dizziness headache, increased sleepiness after patient had a syncopal episode on Wednesday night while she was going to the bathroom.  Patient states that she slipped while getting out of bed and had her right side of her head on the nightstand.  States that she also fell and hit her right hip.  Does not recall LOC.  States she does have a history of SVT.  Denies any shortness of breath or chest pains.  No nausea or vomiting.  Has had increased urinary frequency and some dysuria.  Past Medical History:  Diagnosis Date  . Asthma   . Cancer ([place])    hodgkins lymphoma  . Depression   . GERD (gastroesophageal reflux disease)   . Seasonal allergies   . Thyroid disease    hypothyroid   Family History  Problem Relation Age of Onset  . Breast cancer Maternal Aunt    Past Surgical History:  Procedure Laterality Date  . BREAST BIOPSY     benign  . CATARACT EXTRACTION Bilateral    Patient Active Problem List   Diagnosis Date Noted  . History of Hodgkin's lymphoma 03/01/2017      Prior to Admission medications   Medication Sig Start Date End Date Taking? Authorizing Provider  ADVAIR DISKUS 100-50 MCG/DOSE AEPB  12/03/16   [provider]  albuterol (PROAIR HFA) 108 (90 Base) MCG/ACT inhaler ProAir HFA 90 mcg/actuation aerosol inhaler   2 puffs as needed by inhalation route.    [provider]  buPROPion (WELLBUTRIN SR) 200 MG 12 hr tablet bupropion HCl SR 200 mg tablet,12 hr sustained-release    [provider]  calcium-vitamin D (OSCAL WITH D) 250-125 MG-UNIT tablet Take 1 tablet daily by mouth.    [provider]    conjugated estrogens (PREMARIN) vaginal cream Premarin 0.625 mg/gram vaginal cream  PRN    [provider]  diflorasone-emollient (APEXICON E) 0.05 % CREA ApexiCon E 0.05 % topical cream    [provider]  DOCOSAHEXAENOIC ACID PO Take by mouth.    [provider]  doxycycline (VIBRAMYCIN) 100 MG capsule Take 100 mg 2 (two) times daily by mouth.    [provider]  fluocinonide cream (LIDEX) 0.05 % fluocinonide 0.05 % topical cream    [provider]  fluticasone (FLONASE) 50 MCG/ACT nasal spray Flonase Allergy Relief 50 mcg/actuation nasal spray,suspension   2 sprays every day by nasal route. 07/16/12   [provider]  gabapentin (NEURONTIN) 100 MG capsule gabapentin 100 mg capsule    [provider]  Inulin 2 g CHEW Fiber Gummies 2 gram chewable tablet   1 tablet every day by oral route. 07/16/13   [provider]  levothyroxine (SYNTHROID, LEVOTHROID) 50 MCG tablet levothyroxine 50 mcg tablet    [provider]  Meclizine HCl 25 MG CHEW Meclizine Hcl Chewable 25 mg tablet   1 tablet as needed by oral route. 07/16/05   [provider]  montelukast (SINGULAIR) 10 MG tablet montelukast 10 mg tablet    [provider]  Multiple Vitamin (MULTIVITAMIN) tablet Take 1 tablet daily by mouth.    [provider]  pantoprazole (PROTONIX) 40 MG tablet pantoprazole 40 mg tablet,delayed release  DAILY    [provider]  pravastatin (PRAVACHOL) 40 MG tablet pravastatin 40 mg tablet    [provider]  PREMPRO 0.45-1.5 MG tablet  12/24/16   [provider]  Probiotic Product (SOLUBLE FIBER/PROBIOTICS PO) Take by mouth.    [provider]  tetrahydrozoline-zinc (VISINE-AC) 0.05-0.25 % ophthalmic solution Allergy Relief Eye Drops 0.05 %-0.25 %   1 drop every day by ophthalmic route. 07/16/08   [provider]  traZODone (DESYREL) 50 MG tablet trazodone 50 mg  tablet  DAILY    [provider]  Triprolidine-Pseudoephedrine (ANTIHISTAMINE PO) Take by mouth.    [provider]    Allergies Levofloxacin and Other    Social History Social History   Tobacco Use  . Smoking status: Never Smoker  . Smokeless tobacco: Never Used  Substance Use Topics  . Alcohol use: No  . Drug use: No    Review of Systems Patient denies headaches, rhinorrhea, blurry vision, numbness, shortness of breath, chest pain, edema, cough, abdominal pain, nausea, vomiting, diarrhea, dysuria, fevers, rashes or hallucinations unless otherwise stated above in HPI. ____________________________________________   PHYSICAL EXAM:  VITAL SIGNS: Vitals:   07/31/17 1516 07/31/17 1748  BP: (!) 138/108 (!) 150/77  Pulse: (!) 101 91  Resp: 20 18  Temp: 97.9 F (36.6 C)   SpO2: 99% 99%    Constitutional: Alert and oriented. Well appearing and in no acute distress. Eyes: Conjunctivae are normal.  Head: Atraumatic. Nose: No congestion/rhinnorhea. Mouth/Throat: Mucous membranes are moist.   Neck: No stridor. Painless ROM.  Cardiovascular: Normal rate, regular rhythm. Grossly normal heart sounds.  Good peripheral circulation. Respiratory: Normal respiratory effort.  No retractions. Lungs CTAB. Gastrointestinal: Soft and nontender. No distention. No abdominal bruits. No CVA tenderness. Genitourinary:  Musculoskeletal: No lower extremity tenderness nor edema.  No joint effusions. Neurologic:  CN- intact.  No facial droop, Normal FNF.  Normal heel to shin.  Sensation intact bilaterally. Normal speech and language. No gross focal neurologic deficits are appreciated. No gait instability. Skin:  Skin is warm, dry and intact. No rash noted. Psychiatric: Mood and affect are normal. Speech and behavior are normal.  ____________________________________________   LABS (all labs ordered are listed, but only abnormal results are displayed)  Results for orders  placed or performed during the hospital encounter of 07/31/17 (from the past 24 hour(s))  Basic metabolic panel     Status: None   Collection Time: 07/31/17  4:59 PM  Result Value Ref Range   Sodium 136 135 - 145 mmol/L   Potassium 4.2 3.5 - 5.1 mmol/L   Chloride 101 101 - 111 mmol/L   CO2 27 22 - 32 mmol/L   Glucose, Bld 93 65 - 99 mg/dL   BUN 18 6 - 20 mg/dL   Creatinine, Ser 0.75 0.44 - 1.00 mg/dL   Calcium 9.5 8.9 - 10.3 mg/dL   GFR calc non Af Amer >60 >60 mL/min   GFR calc Af Amer >60 >60 mL/min   Anion gap 8 5 - 15  CBC     Status: None   Collection Time: 07/31/17  4:59 PM  Result Value Ref Range   WBC 5.1 3.6 - 11.0 K/uL   RBC 4.25 3.80 - 5.20 MIL/uL   Hemoglobin 12.9 12.0 - 16.0 g/dL   HCT 38.4 35.0 - 47.0 %   MCV 90.4 80.0 - 100.0 fL   MCH 30.5  26.0 - 34.0 pg   MCHC 33.7 32.0 - 36.0 g/dL   RDW 13.7 11.5 - 14.5 %   Platelets 292 150 - 440 K/uL  Troponin I     Status: None   Collection Time: 07/31/17  4:59 PM  Result Value Ref Range   Troponin I <0.03 <0.03 ng/mL  Urinalysis, Complete w Microscopic     Status: Abnormal   Collection Time: 07/31/17  5:30 PM  Result Value Ref Range   Color, Urine STRAW (A) YELLOW   APPearance CLEAR (A) CLEAR   Specific Gravity, Urine 1.003 (L) 1.005 - 1.030   pH 7.0 5.0 - 8.0   Glucose, UA NEGATIVE NEGATIVE mg/dL   Hgb urine dipstick NEGATIVE NEGATIVE   Bilirubin Urine NEGATIVE NEGATIVE   Ketones, ur NEGATIVE NEGATIVE mg/dL   Protein, ur NEGATIVE NEGATIVE mg/dL   Nitrite NEGATIVE NEGATIVE   Leukocytes, UA NEGATIVE NEGATIVE   RBC / HPF 0-5 0 - 5 RBC/hpf   WBC, UA 0-5 0 - 5 WBC/hpf   Bacteria, UA RARE (A) NONE SEEN   Squamous Epithelial / LPF NONE SEEN NONE SEEN   Mucus PRESENT    ____________________________________________  EKG My review and personal interpretation at Time: 16:59   Indication: syncope  Rate: 90  Rhythm: sinus Axis: normal Other:  Normal intervals, no  stemi ____________________________________________  RADIOLOGY  I personally reviewed all radiographic images ordered to evaluate for the above acute complaints and reviewed radiology reports and findings.  These findings were personally discussed with the patient.  Please see medical record for radiology report.  ____________________________________________   PROCEDURES  Procedure(s) performed:  Procedures    Critical Care performed: no ____________________________________________   INITIAL IMPRESSION / ASSESSMENT AND PLAN / ED COURSE  Pertinent labs & imaging results that were available during my care of the patient were reviewed by me and considered in my medical decision making (see chart for details).  DDX: Dehydration, UTI, concussion, subarachnoid, subdural, tension headache, migraine  Andrea Noble is a 67 y.o. who presents to the ED with symptoms as described above.  Patient well-appearing and in no acute distress.  No focal neuro deficits.  CT imaging shows no acute abnormality.  Blood work is reassuring.  No evidence of UTI.  No evidence of dysrhythmia or ACS.  Symptoms most likely secondary to concussion.  Will give referral to neurologist.  Symptoms have been ongoing for the past several days do not believe patient requires any additional emergent imaging or diagnostic testing at this time and is stable and appropriate for outpatient follow-up.      As part of my medical decision making, I reviewed the following data within the [facility] notes reviewed and incorporated, Labs reviewed, notes from prior ED visits.   ____________________________________________   FINAL CLINICAL IMPRESSION(S) / ED DIAGNOSES  Final diagnoses:  Minor head injury, initial encounter      NEW MEDICATIONS STARTED DURING THIS VISIT:  New Prescriptions   No medications on file     Note:  This document was prepared using Dragon voice recognition software  and may include unintentional dictation errors.    Merlyn Lot, MD 07/31/17 [phone]

## 2017-07-31 NOTE — Discharge Instructions (Signed)

## 2017-07-31 NOTE — ED Triage Notes (Addendum)
Pt fell 5 days ago during the middle of the night while going to the bathroom.  Pt is a resident at [facility]. Pt reports pain in right earlobe and behind right ear. States my right shoulder and right hip hurts.   Pt ambulates without diff.  Good rom of right shoulder.   No loc.  No vomiting.  Pt reports nausea.  No blood thinners.  Pt alert.   Speech clear.

## 2017-11-12 ENCOUNTER — Other Ambulatory Visit: Payer: Self-pay

## 2017-11-12 ENCOUNTER — Emergency Department

## 2017-11-12 ENCOUNTER — Emergency Department
Admission: EM | Admit: 2017-11-12 | Discharge: 2017-11-12 | Disposition: A | Discharge status: Home / Self Care | Attending: Emergency Medicine | Admitting: Emergency Medicine

## 2017-11-12 DIAGNOSIS — R1012 Left upper quadrant pain: Secondary | ICD-10-CM | POA: Diagnosis not present

## 2017-11-12 DIAGNOSIS — R109 Unspecified abdominal pain: Secondary | ICD-10-CM | POA: Diagnosis not present

## 2017-11-12 DIAGNOSIS — K5900 Constipation, unspecified: Secondary | ICD-10-CM | POA: Insufficient documentation

## 2017-11-12 DIAGNOSIS — E039 Hypothyroidism, unspecified: Secondary | ICD-10-CM | POA: Insufficient documentation

## 2017-11-12 DIAGNOSIS — R079 Chest pain, unspecified: Secondary | ICD-10-CM | POA: Diagnosis present

## 2017-11-12 DIAGNOSIS — Z79899 Other long term (current) drug therapy: Secondary | ICD-10-CM | POA: Diagnosis not present

## 2017-11-12 DIAGNOSIS — J45909 Unspecified asthma, uncomplicated: Secondary | ICD-10-CM | POA: Insufficient documentation

## 2017-11-12 LAB — URINALYSIS, COMPLETE (UACMP) WITH MICROSCOPIC
BILIRUBIN URINE: NEGATIVE
Glucose, UA: NEGATIVE mg/dL
KETONES UR: NEGATIVE mg/dL
LEUKOCYTES UA: NEGATIVE
NITRITE: NEGATIVE
PROTEIN: NEGATIVE mg/dL
Specific Gravity, Urine: 1.006 (ref 1.005–1.030)
pH: 7 (ref 5.0–8.0)

## 2017-11-12 LAB — CBC
HEMATOCRIT: 38.2 % (ref 35.0–47.0)
Hemoglobin: 13.1 g/dL (ref 12.0–16.0)
MCH: 31.3 pg (ref 26.0–34.0)
MCHC: 34.4 g/dL (ref 32.0–36.0)
MCV: 91.1 fL (ref 80.0–100.0)
PLATELETS: 308 10*3/uL (ref 150–440)
RBC: 4.2 MIL/uL (ref 3.80–5.20)
RDW: 13.9 % (ref 11.5–14.5)
WBC: 4.8 10*3/uL (ref 3.6–11.0)

## 2017-11-12 LAB — BASIC METABOLIC PANEL
Anion gap: 9 (ref 5–15)
BUN: 13 mg/dL (ref 8–23)
CO2: 28 mmol/L (ref 22–32)
CREATININE: 0.95 mg/dL (ref 0.44–1.00)
Calcium: 9.4 mg/dL (ref 8.9–10.3)
Chloride: 98 mmol/L (ref 98–111)
GFR calc Af Amer: >60 mL/min (ref >60)
GFR calc non Af Amer: >60 mL/min (ref >60)
GLUCOSE: 96 mg/dL (ref 70–99)
POTASSIUM: 4.2 mmol/L (ref 3.5–5.1)
Sodium: 135 mmol/L (ref 135–145)

## 2017-11-12 LAB — HEPATIC FUNCTION PANEL
ALT: 20 U/L (ref 0–44)
AST: 24 U/L (ref 15–41)
Albumin: 4.2 g/dL (ref 3.5–5.0)
Alkaline Phosphatase: 52 U/L (ref 38–126)
Bilirubin, Direct: <0.1 mg/dL (ref 0.0–0.2)
TOTAL PROTEIN: 7.3 g/dL (ref 6.5–8.1)
Total Bilirubin: 0.4 mg/dL (ref 0.3–1.2)

## 2017-11-12 LAB — LIPASE, BLOOD: Lipase: 25 U/L (ref 11–51)

## 2017-11-12 LAB — TROPONIN I: Troponin I: <0.03 ng/mL (ref <0.03)

## 2017-11-12 MED ORDER — IOPAMIDOL (ISOVUE-370) INJECTION 76%
100.0000 mL | Freq: Once | INTRAVENOUS | Status: AC | PRN
  Administered 2017-11-12: 100 mL via INTRAVENOUS

## 2017-11-12 NOTE — ED Triage Notes (Signed)
Pt states "I think it's just gas because I've had bad gas for a week. If it's not gas then it's diverticulitis." pt alert, oriented, ambulatory. States epigastric pain and L rib pain. Also c/o back pain. States was constipated so took laxative and then states "it wiped me out and i'm dehydrated."

## 2017-11-12 NOTE — ED Provider Notes (Signed)
[facility] Emergency Department Provider Note  ____________________________________________  Time seen: Approximately 11:33 AM  I have reviewed the triage vital signs and the nursing notes.   HISTORY  Chief Complaint Chest Pain and Abdominal Pain   HPI Andrea Noble is a 67 y.o. female history of Hodgkin's lymphoma, cancer free for 12 years, hypothyroidism, and GERD who presents for evaluation of abdominal pain.  Patient reports 1 week of intermittent abdominal pain were seen in the left upper quadrant/under her left rib cage.  The pain is sharp and has become constant since this morning.  She reports that she is unable to reproduce the pain by pushing on her abdomen.  She reports that she has been having a lot of gas and has been belching and passing gas from below.  She also reports that she has been constipated and took a couple of laxatives.  After that she had a few bowel movements with the last one this morning.  She denies any prior abdominal surgeries.  She also reports that she feels very weak.  No fever chills, no vomiting, no diarrhea, no dysuria or hematuria.  She reports that the pain is not worse with deep inspiration.  She also endorses mild shortness of breath for the last week, none at rest.  She denies any personal or family history of blood clots, recent travel immobilization, leg pain or swelling, hemoptysis.   Past Medical History:  Diagnosis Date  . Asthma   . Cancer ([place])    hodgkins lymphoma  . Depression   . GERD (gastroesophageal reflux disease)   . Seasonal allergies   . Thyroid disease    hypothyroid    Patient Active Problem List   Diagnosis Date Noted  . History of Hodgkin's lymphoma 03/01/2017    Past Surgical History:  Procedure Laterality Date  . BREAST BIOPSY     benign  . CATARACT EXTRACTION Bilateral     Prior to Admission medications   Medication Sig Start Date End Date Taking? Authorizing Provider  ADVAIR  DISKUS 100-50 MCG/DOSE AEPB  12/03/16   [provider]  albuterol (PROAIR HFA) 108 (90 Base) MCG/ACT inhaler ProAir HFA 90 mcg/actuation aerosol inhaler   2 puffs as needed by inhalation route.    [provider]  buPROPion (WELLBUTRIN SR) 200 MG 12 hr tablet bupropion HCl SR 200 mg tablet,12 hr sustained-release    [provider]  calcium-vitamin D (OSCAL WITH D) 250-125 MG-UNIT tablet Take 1 tablet daily by mouth.    [provider]  conjugated estrogens (PREMARIN) vaginal cream Premarin 0.625 mg/gram vaginal cream  PRN    [provider]  diflorasone-emollient (APEXICON E) 0.05 % CREA ApexiCon E 0.05 % topical cream    [provider]  DOCOSAHEXAENOIC ACID PO Take by mouth.    [provider]  doxycycline (VIBRAMYCIN) 100 MG capsule Take 100 mg 2 (two) times daily by mouth.    [provider]  fluocinonide cream (LIDEX) 0.05 % fluocinonide 0.05 % topical cream    [provider]  fluticasone (FLONASE) 50 MCG/ACT nasal spray Flonase Allergy Relief 50 mcg/actuation nasal spray,suspension   2 sprays every day by nasal route. 07/16/12   [provider]  gabapentin (NEURONTIN) 100 MG capsule gabapentin 100 mg capsule    [provider]  Inulin 2 g CHEW Fiber Gummies 2 gram chewable tablet   1 tablet every day by oral route. 07/16/13   [provider]  levothyroxine (SYNTHROID,  LEVOTHROID) 50 MCG tablet levothyroxine 50 mcg tablet    [provider]  Meclizine HCl 25 MG CHEW Meclizine Hcl Chewable 25 mg tablet   1 tablet as needed by oral route. 07/16/05   [provider]  montelukast (SINGULAIR) 10 MG tablet montelukast 10 mg tablet    [provider]  Multiple Vitamin (MULTIVITAMIN) tablet Take 1 tablet daily by mouth.    [provider]  pantoprazole (PROTONIX) 40 MG tablet pantoprazole 40 mg tablet,delayed release  DAILY    [provider]    pravastatin (PRAVACHOL) 40 MG tablet pravastatin 40 mg tablet    [provider]  PREMPRO 0.45-1.5 MG tablet  12/24/16   [provider]  Probiotic Product (SOLUBLE FIBER/PROBIOTICS PO) Take by mouth.    [provider]  tetrahydrozoline-zinc (VISINE-AC) 0.05-0.25 % ophthalmic solution Allergy Relief Eye Drops 0.05 %-0.25 %   1 drop every day by ophthalmic route. 07/16/08   [provider]  traZODone (DESYREL) 50 MG tablet trazodone 50 mg tablet  DAILY    [provider]  Triprolidine-Pseudoephedrine (ANTIHISTAMINE PO) Take by mouth.    [provider]    Allergies Levofloxacin and Other  Family History  Problem Relation Age of Onset  . Breast cancer Maternal Aunt     Social History Social History   Tobacco Use  . Smoking status: Never Smoker  . Smokeless tobacco: Never Used  Substance Use Topics  . Alcohol use: No  . Drug use: No    Review of Systems  Constitutional: Negative for fever. Eyes: Negative for visual changes. ENT: Negative for sore throat. Neck: No neck pain  Cardiovascular: Negative for chest pain. Respiratory: + shortness of breath. Gastrointestinal: + LUQ abdominal pain. No vomiting or diarrhea. Genitourinary: Negative for dysuria. Musculoskeletal: Negative for back pain. Skin: Negative for rash. Neurological: Negative for headaches, weakness or numbness. Psych: No SI or HI  ____________________________________________   PHYSICAL EXAM:  VITAL SIGNS: Vitals:   11/12/17 1212 11/12/17 1230  BP: (!) 150/80 139/75  Pulse: 79 74  Resp: 18   Temp: 97.8 F (36.6 C)   SpO2: 100% 97%   Constitutional: Alert and oriented. Well appearing and in no apparent distress. HEENT:      Head: Normocephalic and atraumatic.         Eyes: Conjunctivae are normal. Sclera is non-icteric.       Mouth/Throat: Mucous membranes are moist.       Neck: Supple with no signs of meningismus. Cardiovascular: Regular  rate and rhythm. No murmurs, gallops, or rubs. 2+ symmetrical distal pulses are present in all extremities. No JVD. Respiratory: Normal respiratory effort. Lungs are clear to auscultation bilaterally. No wheezes, crackles, or rhonchi.  Gastrointestinal: Soft, non tender, and non distended with positive bowel sounds. No rebound or guarding. Genitourinary: No CVA tenderness. Musculoskeletal: Nontender with normal range of motion in all extremities. No edema, cyanosis, or erythema of extremities. Neurologic: Normal speech and language. Face is symmetric. Moving all extremities. No gross focal neurologic deficits are appreciated. Skin: Skin is warm, dry and intact. No rash noted. Psychiatric: Mood and affect are normal. Speech and behavior are normal.  ____________________________________________   LABS (all labs ordered are listed, but only abnormal results are displayed)  Labs Reviewed  URINALYSIS, COMPLETE (UACMP) WITH MICROSCOPIC - Abnormal; Notable for the following components:      Result Value   Color, Urine STRAW (*)    APPearance CLEAR (*)    Hgb urine dipstick  SMALL (*)    Bacteria, UA RARE (*)    All other components within normal limits  BASIC METABOLIC PANEL  CBC  TROPONIN I  LIPASE, BLOOD  HEPATIC FUNCTION PANEL   ____________________________________________  EKG  ED ECG REPORT I, Rudene Re, the attending physician, personally viewed and interpreted this ECG.  Normal sinus rhythm, rate of 93, normal intervals, normal axis, no ST elevations or depressions.  Normal EKG. ____________________________________________  RADIOLOGY  I have personally reviewed the images performed during this visit and I agree with the Radiologist's read.   Interpretation by Radiologist:  Dg Chest 2 View  Result Date: 11/12/2017 CLINICAL DATA:  67 year old female with history of abdominal pain for the past week. No associated shortness of breath, chest pain, cough or fever at  this time. EXAM: CHEST - 2 VIEW COMPARISON:  Chest x-ray 11/12/2017. FINDINGS: Lung volumes are normal. No consolidative airspace disease. No pleural effusions. No pneumothorax. No pulmonary nodule or mass noted. Calcified mediastinal lymph nodes again noted. Pulmonary vasculature and the cardiomediastinal silhouette are within normal limits. IMPRESSION: No radiographic evidence of acute cardiopulmonary disease. Electronically Signed   By: Vinnie Langton M.D.   On: 11/12/2017 09:43   Ct Angio Chest Pe W And/or Wo Contrast  Result Date: 11/12/2017 CLINICAL DATA:  67 year old female with shortness of breath and lower abdominal pain for the past 2 days. Patient has a known medical history of diverticulitis and Hodgkin's lymphoma. EXAM: CT ANGIOGRAPHY CHEST WITH CONTRAST TECHNIQUE: Multidetector CT imaging of the chest was performed using the standard protocol during bolus administration of intravenous contrast. Multiplanar CT image reconstructions and MIPs were obtained to evaluate the vascular anatomy. CONTRAST:  172mL ISOVUE-370 IOPAMIDOL (ISOVUE-370) INJECTION 76% COMPARISON:  Most recent prior CT scan of the abdomen and pelvis 03/08/2017; prior CT scan of the chest 01/30/2017 FINDINGS: Cardiovascular: Adequate opacification of the pulmonary arteries to the proximal segmental level. No evidence of central filling defect to suggest acute pulmonary embolus. The heart is normal in size. Trace pericardial fluid versus thickening. Normal caliber thoracic aorta with conventional 3 vessel arch anatomy. No evidence of aortic dissection. Numerous venous collaterals are present in the soft tissues of the left upper chest and left proximal arm. No definite subclavian stenosis identified. The innominate vein and superior vena cava are also widely patent. Mediastinum/Nodes: Unremarkable CT appearance of the thyroid gland. No suspicious mediastinal or hilar adenopathy. Stable calcified prevascular lymph nodes. No soft  tissue mediastinal mass. The thoracic esophagus is unremarkable. Lungs/Pleura: Lungs are clear. No pleural effusion or pneumothorax. Upper Abdomen: No acute abnormality. Musculoskeletal: No acute fracture or aggressive appearing lytic or blastic osseous lesion. Review of the MIP images confirms the above findings. IMPRESSION: 1. Negative for acute pulmonary embolus, pneumonia or other acute cardiopulmonary process. 2. Trace pericardial fluid versus thickening of uncertain clinical significance. This is a new finding compared to the prior chest CT from October 2018. Electronically Signed   By: Jacqulynn Cadet M.D.   On: 11/12/2017 12:12   Ct Abdomen Pelvis W Contrast  Result Date: 11/12/2017 CLINICAL DATA:  Shortness of breath and lower abdominal pain. History of diverticulitis and hydro dense lymphoma. EXAM: CT ABDOMEN AND PELVIS WITH CONTRAST TECHNIQUE: Multidetector CT imaging of the abdomen and pelvis was performed using the standard protocol following bolus administration of intravenous contrast. CONTRAST:  166mL ISOVUE-370 IOPAMIDOL (ISOVUE-370) INJECTION 76% COMPARISON:  03/08/2017 FINDINGS: Lower chest: No acute abnormality. Hepatobiliary: No focal liver abnormality is seen. No gallstones, gallbladder wall thickening, or  biliary dilatation. Pancreas: Heterogeneous fatty infiltration of the head of pancreas is again noted. No inflammation or mass. Spleen: Normal in size without focal abnormality. Adrenals/Urinary Tract: Normal adrenal glands. The kidneys are unremarkable. No mass or hydronephrosis identified bilaterally. The urinary bladder is normal. Stomach/Bowel: The stomach is unremarkable. No pathologic dilatation of the small bowel loops. A moderate stool burden is identified throughout the colon. Scattered distal colonic diverticula noted without acute inflammation. Vascular/Lymphatic: Aortic atherosclerosis. No aneurysm. No abdominal or pelvic adenopathy. No inguinal adenopathy. Reproductive: The  uterus and adnexal structures are unremarkable. Other: There is no free fluid or fluid collections identified within the abdomen or pelvis. Musculoskeletal: Spondylosis within the lower lumbar spine noted with anterolisthesis of L4 on L5. IMPRESSION: 1. No acute findings within the abdomen or pelvis. 2. Moderate stool burden within the colon which may reflect constipation. 3.  Aortic Atherosclerosis (ICD10-I70.0). Electronically Signed   By: Kerby Moors M.D.   On: 11/12/2017 12:34     ____________________________________________   PROCEDURES  Procedure(s) performed: None Procedures Critical Care performed:  None ____________________________________________   INITIAL IMPRESSION / ASSESSMENT AND PLAN / ED COURSE  67 y.o. female history of Hodgkin's lymphoma, cancer free for 12 years, hypothyroidism, and GERD who presents for evaluation of LUQ abdominal/ left lower chest pain x 1 week.  Patient is well-appearing, no distress, she has normal vital signs, palpation of her abdomen does not elicit any tenderness, lungs are clear to auscultation, heart regular rate and rhythm, legs have no pitting edema.  Differential diagnosis is broad and includes but not limited to diverticulitis versus gastritis versus peptic ulcer disease versus constipation versus SBO versus pulmonary embolism in a patient with a history of Hodgkin's lymphoma and also complaining of shortness of breath.  Will check CBC, CMP, lipase, urinalysis, and CT chest abdomen pelvis.    _________________________ 1:26 PM on 11/12/2017 -----------------------------------------  CT chest abdomen pelvis with no acute etiology other than constipation.  Patient is going to be discharged home on senna, Colace, MiraLAX, and follow-up with primary care doctor.   As part of my medical decision making, I reviewed the following data within the [place] notes reviewed and incorporated, Labs reviewed , EKG interpreted ,  Old EKG reviewed, Old chart reviewed, Radiograph reviewed , Notes from prior ED visits and [place] Controlled Substance Database    Pertinent labs & imaging results that were available during my care of the patient were reviewed by me and considered in my medical decision making (see chart for details).    ____________________________________________   FINAL CLINICAL IMPRESSION(S) / ED DIAGNOSES  Final diagnoses:  Abdominal pain  Left upper quadrant pain  Constipation, unspecified constipation type      NEW MEDICATIONS STARTED DURING THIS VISIT:  ED Discharge Orders    None       Note:  This document was prepared using Dragon voice recognition software and may include unintentional dictation errors.    Alfred Levins, [place], MD 11/12/17 [phone]

## 2017-11-12 NOTE — Discharge Instructions (Addendum)
Constipation: Take colace twice a day everyday. Take senna once a day at bedtime. Take daily probiotics. Drink plenty of fluids and eat a diet rich in fiber. Take 1 cap full of Miralax in the morning and one in the evening for 5 to 7 days. Make sure to drink plenty of water to prevent dehydration.  Follow-up with your doctor in 2 days.  Return to the emergency room for new or worsening abdominal pain, vomiting, or fever.

## 2017-11-13 DIAGNOSIS — K5909 Other constipation: Secondary | ICD-10-CM | POA: Insufficient documentation

## 2017-12-12 DIAGNOSIS — F411 Generalized anxiety disorder: Secondary | ICD-10-CM | POA: Insufficient documentation

## 2017-12-12 DIAGNOSIS — M2550 Pain in unspecified joint: Secondary | ICD-10-CM | POA: Insufficient documentation

## 2018-04-30 DIAGNOSIS — Z780 Asymptomatic menopausal state: Secondary | ICD-10-CM | POA: Insufficient documentation

## 2018-04-30 DIAGNOSIS — M19041 Primary osteoarthritis, right hand: Secondary | ICD-10-CM | POA: Insufficient documentation

## 2018-06-21 ENCOUNTER — Other Ambulatory Visit: Payer: Self-pay | Admitting: Internal Medicine

## 2018-06-21 DIAGNOSIS — Z1231 Encounter for screening mammogram for malignant neoplasm of breast: Secondary | ICD-10-CM

## 2018-06-23 ENCOUNTER — Other Ambulatory Visit: Payer: Self-pay

## 2018-06-23 ENCOUNTER — Emergency Department
Admission: EM | Admit: 2018-06-23 | Discharge: 2018-06-24 | Disposition: A | Discharge status: Home / Self Care | Payer: Medicare Other | Attending: Emergency Medicine | Admitting: Emergency Medicine

## 2018-06-23 ENCOUNTER — Encounter: Payer: Self-pay | Admitting: Emergency Medicine

## 2018-06-23 ENCOUNTER — Emergency Department: Payer: Medicare Other

## 2018-06-23 DIAGNOSIS — Z8571 Personal history of Hodgkin lymphoma: Secondary | ICD-10-CM | POA: Diagnosis not present

## 2018-06-23 DIAGNOSIS — Z79899 Other long term (current) drug therapy: Secondary | ICD-10-CM | POA: Insufficient documentation

## 2018-06-23 DIAGNOSIS — R112 Nausea with vomiting, unspecified: Secondary | ICD-10-CM | POA: Diagnosis present

## 2018-06-23 DIAGNOSIS — R197 Diarrhea, unspecified: Secondary | ICD-10-CM | POA: Diagnosis not present

## 2018-06-23 DIAGNOSIS — J45909 Unspecified asthma, uncomplicated: Secondary | ICD-10-CM | POA: Insufficient documentation

## 2018-06-23 DIAGNOSIS — Z7902 Long term (current) use of antithrombotics/antiplatelets: Secondary | ICD-10-CM | POA: Insufficient documentation

## 2018-06-23 DIAGNOSIS — E039 Hypothyroidism, unspecified: Secondary | ICD-10-CM | POA: Diagnosis not present

## 2018-06-23 LAB — URINALYSIS, COMPLETE (UACMP) WITH MICROSCOPIC
Bilirubin Urine: NEGATIVE
GLUCOSE, UA: NEGATIVE mg/dL
HGB URINE DIPSTICK: NEGATIVE
Ketones, ur: NEGATIVE mg/dL
Leukocytes,Ua: NEGATIVE
NITRITE: NEGATIVE
PROTEIN: NEGATIVE mg/dL
Specific Gravity, Urine: 1.012 (ref 1.005–1.030)
WBC UA: NONE SEEN WBC/hpf (ref 0–5)
pH: 8 (ref 5.0–8.0)

## 2018-06-23 LAB — LIPASE, BLOOD: Lipase: 22 U/L (ref 11–51)

## 2018-06-23 LAB — CBC
HCT: 37.9 % (ref 36.0–46.0)
HEMOGLOBIN: 12.6 g/dL (ref 12.0–15.0)
MCH: 30.4 pg (ref 26.0–34.0)
MCHC: 33.2 g/dL (ref 30.0–36.0)
MCV: 91.3 fL (ref 80.0–100.0)
NRBC: 0 % (ref 0.0–0.2)
Platelets: 207 10*3/uL (ref 150–400)
RBC: 4.15 MIL/uL (ref 3.87–5.11)
RDW: 13.3 % (ref 11.5–15.5)
WBC: 4.9 10*3/uL (ref 4.0–10.5)

## 2018-06-23 LAB — COMPREHENSIVE METABOLIC PANEL
ALBUMIN: 4.2 g/dL (ref 3.5–5.0)
ALT: 22 U/L (ref 0–44)
ANION GAP: 10 (ref 5–15)
AST: 25 U/L (ref 15–41)
Alkaline Phosphatase: 53 U/L (ref 38–126)
BUN: 8 mg/dL (ref 8–23)
CO2: 25 mmol/L (ref 22–32)
Calcium: 8.6 mg/dL — ABNORMAL LOW (ref 8.9–10.3)
Chloride: 90 mmol/L — ABNORMAL LOW (ref 98–111)
Creatinine, Ser: 0.74 mg/dL (ref 0.44–1.00)
GFR calc non Af Amer: >60 mL/min (ref >60)
GLUCOSE: 106 mg/dL — AB (ref 70–99)
POTASSIUM: 3.8 mmol/L (ref 3.5–5.1)
SODIUM: 125 mmol/L — AB (ref 135–145)
Total Bilirubin: 0.7 mg/dL (ref 0.3–1.2)
Total Protein: 7 g/dL (ref 6.5–8.1)

## 2018-06-23 MED ORDER — MORPHINE SULFATE (PF) 4 MG/ML IV SOLN
4.0000 mg | Freq: Once | INTRAVENOUS | Status: AC
  Administered 2018-06-23: 4 mg via INTRAVENOUS
  Filled 2018-06-23: qty 1

## 2018-06-23 MED ORDER — SODIUM CHLORIDE 0.9% FLUSH: 3.0000 mL | Freq: Once | INTRAVENOUS | Status: DC

## 2018-06-23 MED ORDER — IOHEXOL 300 MG/ML  SOLN
100.0000 mL | Freq: Once | INTRAMUSCULAR | Status: AC | PRN
  Administered 2018-06-23: 100 mL via INTRAVENOUS

## 2018-06-23 MED ORDER — SODIUM CHLORIDE 0.9 % IV BOLUS
1000.0000 mL | Freq: Once | INTRAVENOUS | Status: AC
  Administered 2018-06-23: 1000 mL via INTRAVENOUS

## 2018-06-23 MED ORDER — ONDANSETRON HCL 4 MG/2ML IJ SOLN
INTRAMUSCULAR | Status: AC
  Administered 2018-06-23: 4 mg via INTRAVENOUS
  Filled 2018-06-23: qty 2

## 2018-06-23 MED ORDER — DIPHENHYDRAMINE HCL 50 MG/ML IJ SOLN
12.5000 mg | INTRAMUSCULAR | Status: AC
  Administered 2018-06-23: 12.5 mg via INTRAVENOUS
  Filled 2018-06-23: qty 1

## 2018-06-23 MED ORDER — HALOPERIDOL LACTATE 5 MG/ML IJ SOLN
1.0000 mg | Freq: Once | INTRAMUSCULAR | Status: AC
  Administered 2018-06-23: 1 mg via INTRAVENOUS
  Filled 2018-06-23: qty 1

## 2018-06-23 MED ORDER — ONDANSETRON 4 MG PO TBDP
4.0000 mg | ORAL_TABLET | Freq: Once | ORAL | Status: AC | PRN
  Administered 2018-06-23: 4 mg via ORAL
  Filled 2018-06-23: qty 1

## 2018-06-23 MED ORDER — ONDANSETRON HCL 4 MG/2ML IJ SOLN
4.0000 mg | Freq: Once | INTRAMUSCULAR | Status: AC
  Administered 2018-06-23: 4 mg via INTRAVENOUS

## 2018-06-23 NOTE — ED Provider Notes (Signed)
[facility] Emergency Department Provider Note  ____________________________________________   First MD Initiated Contact with Patient 06/23/18 2258     (approximate)  I have reviewed the triage vital signs and the nursing notes.   HISTORY  Chief Complaint Emesis and Diarrhea    HPI Andrea Noble is a 68 y.o. female with medical history as listed below  who presents for evaluation of a variety of concerns but most notably persistent nausea with 3 episodes of vomiting today and multiple episodes of diarrhea in the setting of a recent respiratory illness.  She reports that for about 4 days she has been having a cough and some shortness of breath associated with deep breaths and exertion.  She went to a clinic yesterday and was prescribed Augmentin and 2 different kinds of cough medicine.  She has had Augmentin in the past and says that it does mess up her stomach sometimes.  Starting this morning she has had the nausea and multiple episodes of vomiting and has been unable to keep down anything.  However she has been waiting for 7 and half hours in the emergency department and she has not had any diarrhea or vomiting since coming to the ED although she has not tried to eat anything.  She reports some mild cramping abdominal pain that is slightly worse in the left side of her abdomen, somewhat in the left upper quadrant, but no sharp stabbing pains.  She denies fever/chills, chest pain, shortness of breath other than the cough, and she denies dysuria.  She describes the symptoms as severe and she feels generalized weakness, fatigue, and malaise.  Nothing in particular makes his symptoms better or worse.  She is also had a generalized headache that has been gradual in onset over the about the last 24 to 36 hours that is severe.  She has no numbness nor tingling in her extremities.     Past Medical History:  Diagnosis Date  . Asthma   . Cancer ([place])    hodgkins lymphoma   . Depression   . GERD (gastroesophageal reflux disease)   . Seasonal allergies   . Thyroid disease    hypothyroid    Patient Active Problem List   Diagnosis Date Noted  . History of Hodgkin's lymphoma 03/01/2017    Past Surgical History:  Procedure Laterality Date  . BREAST BIOPSY     benign  . CATARACT EXTRACTION Bilateral     Prior to Admission medications   Medication Sig Start Date End Date Taking? Authorizing Provider  ADVAIR DISKUS 100-50 MCG/DOSE AEPB  12/03/16   [provider]  albuterol (PROAIR HFA) 108 (90 Base) MCG/ACT inhaler ProAir HFA 90 mcg/actuation aerosol inhaler   2 puffs as needed by inhalation route.    [provider]  buPROPion (WELLBUTRIN SR) 200 MG 12 hr tablet bupropion HCl SR 200 mg tablet,12 hr sustained-release    [provider]  calcium-vitamin D (OSCAL WITH D) 250-125 MG-UNIT tablet Take 1 tablet daily by mouth.    [provider]  conjugated estrogens (PREMARIN) vaginal cream Premarin 0.625 mg/gram vaginal cream  PRN    [provider]  diflorasone-emollient (APEXICON E) 0.05 % CREA ApexiCon E 0.05 % topical cream    [provider]  DOCOSAHEXAENOIC ACID PO Take by mouth.    [provider]  doxycycline (VIBRAMYCIN) 100 MG capsule Take 100 mg 2 (two) times daily by mouth.    [provider]  fluocinonide cream (LIDEX) 0.05 %  fluocinonide 0.05 % topical cream    [provider]  fluticasone (FLONASE) 50 MCG/ACT nasal spray Flonase Allergy Relief 50 mcg/actuation nasal spray,suspension   2 sprays every day by nasal route. 07/16/12   [provider]  gabapentin (NEURONTIN) 100 MG capsule gabapentin 100 mg capsule    [provider]  Inulin 2 g CHEW Fiber Gummies 2 gram chewable tablet   1 tablet every day by oral route. 07/16/13   [provider]  levothyroxine (SYNTHROID, LEVOTHROID) 50 MCG tablet levothyroxine 50 mcg tablet    [provider]  Meclizine HCl 25 MG CHEW Meclizine Hcl Chewable 25 mg tablet   1 tablet as needed by oral route. 07/16/05   [provider]  montelukast (SINGULAIR) 10 MG tablet montelukast 10 mg tablet    [provider]  Multiple Vitamin (MULTIVITAMIN) tablet Take 1 tablet daily by mouth.    [provider]  ondansetron (ZOFRAN ODT) 4 MG disintegrating tablet Allow 1-2 tablets to dissolve in your mouth every 8 hours as needed for nausea/vomiting 06/24/18   Hinda Kehr, MD  pantoprazole (PROTONIX) 40 MG tablet pantoprazole 40 mg tablet,delayed release  DAILY    [provider]  pravastatin (PRAVACHOL) 40 MG tablet pravastatin 40 mg tablet    [provider]  PREMPRO 0.45-1.5 MG tablet  12/24/16   [provider]  Probiotic Product (SOLUBLE FIBER/PROBIOTICS PO) Take by mouth.    [provider]  tetrahydrozoline-zinc (VISINE-AC) 0.05-0.25 % ophthalmic solution Allergy Relief Eye Drops 0.05 %-0.25 %   1 drop every day by ophthalmic route. 07/16/08   [provider]  traZODone (DESYREL) 50 MG tablet trazodone 50 mg tablet  DAILY    [provider]  Triprolidine-Pseudoephedrine (ANTIHISTAMINE PO) Take by mouth.    [provider]    Allergies Levofloxacin and Other  Family History  Problem Relation Age of Onset  . Breast cancer Maternal Aunt     Social History Social History   Tobacco Use  . Smoking status: Never Smoker  . Smokeless tobacco: Never Used  Substance Use Topics  . Alcohol use: No  . Drug use: No    Review of Systems Constitutional: No fever/chills.  Generalized malaise and fatigue. Eyes: No visual changes. ENT: No sore throat. Cardiovascular: Denies chest pain. Respiratory: Denies shortness of breath.  Recent persistent cough for which she was prescribed Augmentin. Gastrointestinal: Persistent nausea, vomiting, and diarrhea over the course of today.  Intermittent abdominal  cramping, no sharp abdominal pain. Genitourinary: Negative for dysuria. Musculoskeletal: Negative for neck pain.  Negative for back pain. Integumentary: Negative for rash. Neurological: Generalized severe gradual in onset headache over the course of the day.  No focal numbness nor weakness.   ____________________________________________   PHYSICAL EXAM:  VITAL SIGNS: ED Triage Vitals  Enc Vitals Group     BP 06/23/18 1605 (!) 154/80     Pulse Rate 06/23/18 1605 87     Resp 06/23/18 1605 18     Temp 06/23/18 1605 97.7 F (36.5 C)     Temp Source 06/23/18 1605 Oral     SpO2 06/23/18 1605 99 %     Weight 06/23/18 1606 60.3 kg (133 lb)     Height 06/23/18 1606 1.626 m (5\' 4" )     Head Circumference --      Peak Flow --      Pain Score 06/23/18 1606 8     Pain Loc --  Pain Edu? --      Excl. in [place]? --     Constitutional: Alert and oriented.  Generally well-appearing but does appear uncomfortable. Eyes: Conjunctivae are normal.  Head: Atraumatic. Nose: No congestion/rhinnorhea. Mouth/Throat: Mucous membranes are dry. Neck: No stridor.  No meningeal signs.   Cardiovascular: Normal rate, regular rhythm. Good peripheral circulation. Grossly normal heart sounds. Respiratory: Normal respiratory effort.  No retractions. Lungs CTAB. Gastrointestinal: Soft and nontender. No distention.  Musculoskeletal: No lower extremity tenderness nor edema. No gross deformities of extremities. Neurologic:  Normal speech and language. No gross focal neurologic deficits are appreciated.  Skin:  Skin is warm, dry and intact. No rash noted. Psychiatric: Mood and affect are normal. Speech and behavior are normal.  ____________________________________________   LABS (all labs ordered are listed, but only abnormal results are displayed)  Labs Reviewed  COMPREHENSIVE METABOLIC PANEL - Abnormal; Notable for the following components:      Result Value   Sodium 125 (*)    Chloride 90 (*)     Glucose, Bld 106 (*)    Calcium 8.6 (*)    All other components within normal limits  URINALYSIS, COMPLETE (UACMP) WITH MICROSCOPIC - Abnormal; Notable for the following components:   Color, Urine YELLOW (*)    APPearance CLOUDY (*)    Bacteria, UA RARE (*)    All other components within normal limits  LIPASE, BLOOD  CBC   ____________________________________________  EKG  No indication for EKG ____________________________________________  RADIOLOGY   ED MD interpretation:  No acute abnormalities, moderate stool burden  Official radiology report(s): Ct Abdomen Pelvis W Contrast  Result Date: 06/24/2018 CLINICAL DATA:  68 year old female with nausea vomiting. Concern for bowel obstruction. EXAM: CT ABDOMEN AND PELVIS WITH CONTRAST TECHNIQUE: Multidetector CT imaging of the abdomen and pelvis was performed using the standard protocol following bolus administration of intravenous contrast. CONTRAST:  131mL OMNIPAQUE IOHEXOL 300 MG/ML  SOLN COMPARISON:  CT of the abdomen pelvis dated 11/12/2017 FINDINGS: Lower chest: Linear bibasilar atelectasis/scarring. The visualized lung bases are otherwise clear. No intra-abdominal free air or free fluid. Hepatobiliary: No focal liver abnormality is seen. No gallstones, gallbladder wall thickening, or biliary dilatation. Pancreas: Unremarkable. No pancreatic ductal dilatation or surrounding inflammatory changes. Spleen: Normal in size without focal abnormality. Adrenals/Urinary Tract: The adrenal glands, kidneys, visualized ureters, and urinary bladder appear unremarkable. Stomach/Bowel: There is moderate amount of stool throughout the colon. There is sigmoid diverticulosis without active inflammatory changes. There is no bowel obstruction or active inflammation. The appendix is not visualized with certainty. No inflammatory changes identified in the right lower quadrant. Vascular/Lymphatic: No significant vascular findings are present. No enlarged  abdominal or pelvic lymph nodes. Reproductive: Small areas of enhancement in the uterine fundus may represent small fibroids. No adnexal masses. Other: None Musculoskeletal: L4-L5 and L5-S1 disc desiccation. Grade 1 L4-L5 anterolisthesis. No acute osseous pathology. IMPRESSION: 1. No acute intra-abdominal or pelvic pathology. 2. Sigmoid diverticulosis. No bowel obstruction or active inflammation. Electronically Signed   By: Anner Crete M.D.   On: 06/24/2018 00:00    ____________________________________________   PROCEDURES  Critical Care performed: No   Procedure(s) performed:   Procedures   ____________________________________________   INITIAL IMPRESSION / MDM / [place] / ED COURSE  As part of my medical decision making, I reviewed the following data within the [place] notes reviewed and incorporated, Labs reviewed , Old chart reviewed and Notes from prior ED visits  Differential diagnosis includes, but is not limited to, viral gastroenteritis, SBO/ileus, diverticulitis, nonspecific other intra-abdominal infection, neoplasm, medication side effect such as from the Augmentin.  The patient is generally well-appearing and her vital signs are stable.  Her lab work is notable for hyponatremia which appears to be acute and likely related to volume depletion.  Electrolytes and renal function are all essentially normal and she has a few red blood cells on her urinalysis but no evidence of infection.  Lipase is normal and LFTs are normal.  Given that she has been having a respiratory infection and that she did started Augmentin, I think that her GI symptoms are multifactorial but likely both viral and medication side effect.  However, she continues to feel persistently ill and given a history of diverticulosis and the pain that seems to be worse on the left side of her abdomen and her persistent nausea and vomiting (she started to gag and spit  into a bag while I was in the room), I am going to obtain a CT scan of the abdomen and pelvis with IV contrast only to evaluate for signs of acute infection and/or obstruction.  She and her husband understand and agree with the plan.  She is getting 1 L normal saline IV bolus and has already received Zofran 4 mg by mouth and Zofran 4 mg IV.  Since she is still having some nausea and vomiting, I am ordering haloperidol 1 mg IV and diphenhydramine 12.5 mg IV for the refractory nausea and vomiting.  She is also getting morphine 4 mg IV for her headache as well as the abdominal cramping that seems to be coming and going.  She and her husband understand and agree with the plan.   Clinical Course as of Jun 25 203  Nancy Fetter Jun 24, 2018  0203 The patient was sleeping when I came in and smiled when I woke her up and says she feels much better.  She has been able to tolerate some fluids.  She is comfortable with the plan to go home.  Her CT scan did not show any acute abnormalities or intra-abdominal infections and I suspect her symptoms are likely viral.  I gave my usual customary return precautions and she and her husband understand and agree with the plan.   [CF]    Clinical Course User Index [CF] Hinda Kehr, MD    ____________________________________________  FINAL CLINICAL IMPRESSION(S) / ED DIAGNOSES  Final diagnoses:  Nausea vomiting and diarrhea     MEDICATIONS GIVEN DURING THIS VISIT:  Medications  sodium chloride flush (NS) 0.9 % injection 3 mL (has no administration in time range)  ondansetron (ZOFRAN-ODT) disintegrating tablet 4 mg (4 mg Oral Given 06/23/18 1611)  sodium chloride 0.9 % bolus 1,000 mL (0 mLs Intravenous Stopped 06/23/18 2325)  ondansetron (ZOFRAN) injection 4 mg (4 mg Intravenous Given 06/23/18 2238)  morphine 4 MG/ML injection 4 mg (4 mg Intravenous Given 06/23/18 2314)  haloperidol lactate (HALDOL) injection 1 mg (1 mg Intravenous Given 06/23/18 2334)  diphenhydrAMINE  (BENADRYL) injection 12.5 mg (12.5 mg Intravenous Given 06/23/18 2334)  iohexol (OMNIPAQUE) 300 MG/ML solution 100 mL (100 mLs Intravenous Contrast Given 06/23/18 2342)     ED Discharge Orders         Ordered    ondansetron (ZOFRAN ODT) 4 MG disintegrating tablet     06/24/18 0204           Note:  This document was prepared using Dragon voice recognition software and  may include unintentional dictation errors.   Hinda Kehr, MD 06/24/18 [phone]

## 2018-06-23 NOTE — ED Notes (Signed)
Patient transported to CT 

## 2018-06-23 NOTE — ED Triage Notes (Signed)
Pt to ER with c/o diarrhea for last several days and vomiting that started today.  Pt with active vomiting in triage.

## 2018-06-23 NOTE — ED Notes (Signed)
ED Provider at bedside. 

## 2018-06-24 MED ORDER — ONDANSETRON 4 MG PO TBDP: ORAL_TABLET | ORAL | 0 refills | Status: DC

## 2018-06-24 NOTE — ED Notes (Signed)
Peripheral IV discontinued. Catheter intact. No signs of infiltration or redness. Gauze applied to IV site.   Discharge instructions reviewed with patient. Questions fielded by this RN. Patient verbalizes understanding of instructions. Patient discharged home in stable condition per forbach. No acute distress noted at time of discharge.    

## 2018-06-24 NOTE — Discharge Instructions (Signed)

## 2018-07-09 ENCOUNTER — Ambulatory Visit
Admission: RE | Admit: 2018-07-09 | Discharge: 2018-07-09 | Disposition: A | Discharge status: Home / Self Care | Payer: Medicare Other | Attending: Gastroenterology | Admitting: Gastroenterology

## 2018-07-09 ENCOUNTER — Encounter
Admission: RE | Disposition: A | Discharge status: Home / Self Care | Payer: Self-pay | Source: Home / Self Care | Attending: Gastroenterology

## 2018-07-09 ENCOUNTER — Encounter: Payer: Self-pay | Admitting: Anesthesiology

## 2018-07-09 ENCOUNTER — Ambulatory Visit: Payer: Medicare Other | Admitting: Anesthesiology

## 2018-07-09 ENCOUNTER — Other Ambulatory Visit: Payer: Self-pay

## 2018-07-09 DIAGNOSIS — Z79899 Other long term (current) drug therapy: Secondary | ICD-10-CM | POA: Diagnosis not present

## 2018-07-09 DIAGNOSIS — Z79890 Hormone replacement therapy (postmenopausal): Secondary | ICD-10-CM | POA: Insufficient documentation

## 2018-07-09 DIAGNOSIS — K6389 Other specified diseases of intestine: Secondary | ICD-10-CM | POA: Diagnosis not present

## 2018-07-09 DIAGNOSIS — Z86010 Personal history of colonic polyps: Secondary | ICD-10-CM | POA: Insufficient documentation

## 2018-07-09 DIAGNOSIS — K317 Polyp of stomach and duodenum: Secondary | ICD-10-CM | POA: Diagnosis not present

## 2018-07-09 DIAGNOSIS — Z8571 Personal history of Hodgkin lymphoma: Secondary | ICD-10-CM | POA: Insufficient documentation

## 2018-07-09 DIAGNOSIS — F329 Major depressive disorder, single episode, unspecified: Secondary | ICD-10-CM | POA: Diagnosis not present

## 2018-07-09 DIAGNOSIS — Z09 Encounter for follow-up examination after completed treatment for conditions other than malignant neoplasm: Secondary | ICD-10-CM | POA: Insufficient documentation

## 2018-07-09 DIAGNOSIS — D122 Benign neoplasm of ascending colon: Secondary | ICD-10-CM | POA: Insufficient documentation

## 2018-07-09 DIAGNOSIS — K210 Gastro-esophageal reflux disease with esophagitis: Secondary | ICD-10-CM | POA: Insufficient documentation

## 2018-07-09 DIAGNOSIS — J45909 Unspecified asthma, uncomplicated: Secondary | ICD-10-CM | POA: Insufficient documentation

## 2018-07-09 DIAGNOSIS — K2270 Barrett's esophagus without dysplasia: Secondary | ICD-10-CM | POA: Insufficient documentation

## 2018-07-09 DIAGNOSIS — K5730 Diverticulosis of large intestine without perforation or abscess without bleeding: Secondary | ICD-10-CM | POA: Diagnosis not present

## 2018-07-09 DIAGNOSIS — E039 Hypothyroidism, unspecified: Secondary | ICD-10-CM | POA: Diagnosis not present

## 2018-07-09 DIAGNOSIS — D120 Benign neoplasm of cecum: Secondary | ICD-10-CM | POA: Diagnosis not present

## 2018-07-09 HISTORY — PX: COLONOSCOPY WITH PROPOFOL: SHX5780

## 2018-07-09 HISTORY — PX: ESOPHAGOGASTRODUODENOSCOPY: SHX5428

## 2018-07-09 SURGERY — EGD (ESOPHAGOGASTRODUODENOSCOPY)
Anesthesia: General

## 2018-07-09 MED ORDER — LIDOCAINE HCL (PF) 1 % IJ SOLN
INTRAMUSCULAR | Status: AC
  Administered 2018-07-09: 0.3 mL
  Filled 2018-07-09: qty 2

## 2018-07-09 MED ORDER — PROPOFOL 500 MG/50ML IV EMUL
INTRAVENOUS | Status: AC
  Filled 2018-07-09: qty 50

## 2018-07-09 MED ORDER — PROPOFOL 10 MG/ML IV BOLUS
INTRAVENOUS | Status: AC
  Filled 2018-07-09: qty 20

## 2018-07-09 MED ORDER — SODIUM CHLORIDE 0.9 % IV SOLN
INTRAVENOUS | Status: DC
  Administered 2018-07-09: 1000 mL via INTRAVENOUS

## 2018-07-09 MED ORDER — PROPOFOL 500 MG/50ML IV EMUL
INTRAVENOUS | Status: DC | PRN
  Administered 2018-07-09: 110 ug/kg/min via INTRAVENOUS

## 2018-07-09 MED ORDER — PROPOFOL 10 MG/ML IV BOLUS
INTRAVENOUS | Status: DC | PRN
  Administered 2018-07-09: 100 mg via INTRAVENOUS

## 2018-07-09 NOTE — Anesthesia Preprocedure Evaluation (Signed)
Anesthesia Evaluation  Patient identified by MRN, date of birth, ID band Patient awake    Reviewed: Allergy & Precautions, NPO status , Patient's Chart, lab work & pertinent test results  Airway Mallampati: III       Dental   Pulmonary asthma ,    Pulmonary exam normal        Cardiovascular negative cardio ROS Normal cardiovascular exam     Neuro/Psych PSYCHIATRIC DISORDERS Depression negative neurological ROS     GI/Hepatic Neg liver ROS, GERD  ,  Endo/Other  Hypothyroidism   Renal/GU negative Renal ROS     Musculoskeletal   Abdominal Normal abdominal exam  (+)   Peds  Hematology   Anesthesia Other Findings Past Medical History: No date: Asthma No date: Cancer ([place])     Comment:  hodgkins lymphoma No date: Depression No date: GERD (gastroesophageal reflux disease) No date: Seasonal allergies No date: Thyroid disease     Comment:  hypothyroid  Reproductive/Obstetrics                             Anesthesia Physical Anesthesia Plan  ASA: III  Anesthesia Plan: General   Post-op Pain Management:    Induction: Intravenous  PONV Risk Score and Plan:   Airway Management Planned: Nasal Cannula  Additional Equipment:   Intra-op Plan:   Post-operative Plan:   Informed Consent: I have reviewed the patients History and Physical, chart, labs and discussed the procedure including the risks, benefits and alternatives for the proposed anesthesia with the patient or authorized representative who has indicated his/her understanding and acceptance.     Dental advisory given  Plan Discussed with: CRNA and Surgeon  Anesthesia Plan Comments:         Anesthesia Quick Evaluation

## 2018-07-09 NOTE — Transfer of Care (Signed)
Immediate Anesthesia Transfer of Care Note  Patient: Andrea Noble  Procedure(s) Performed: ESOPHAGOGASTRODUODENOSCOPY (EGD) (N/A ) COLONOSCOPY WITH PROPOFOL (N/A )  Patient Location: PACU  Anesthesia Type:General  Level of Consciousness: drowsy  Airway & Oxygen Therapy: Patient Spontanous Breathing and Patient connected to nasal cannula oxygen  Post-op Assessment: Report given to RN and Post -op Vital signs reviewed and stable  Post vital signs: Reviewed and stable  Last Vitals:  Vitals Value Taken Time  BP 127/77 07/09/2018 11:23 AM  Temp 36.1 C 07/09/2018 11:22 AM  Pulse 86 07/09/2018 11:23 AM  Resp 19 07/09/2018 11:23 AM  SpO2 97 % 07/09/2018 11:23 AM  Vitals shown include unvalidated device data.  Last Pain:  Vitals:   07/09/18 1122  TempSrc: Tympanic  PainSc: Asleep         Complications: No apparent anesthesia complications

## 2018-07-09 NOTE — Op Note (Addendum)
[facility] Gastroenterology Patient Name: Andrea Noble Procedure Date: 07/09/2018 9:57 AM MRN: [id number] Account #: [id number] Date of Birth: 17-Nov-1950 Admit Type: Outpatient Age: 68 Room: [facility] ENDO ROOM 3 Gender: Female Note Status: Finalized Procedure:            Colonoscopy Indications:          Personal history of colonic polyps Providers:            Lollie Sails, MD Medicines:            Monitored Anesthesia Care Complications:        No immediate complications. Procedure:            Pre-Anesthesia Assessment:                       - ASA Grade Assessment: III - A patient with severe                        systemic disease.                       After obtaining informed consent, the colonoscope was                        passed under direct vision. Throughout the procedure,                        the patient's blood pressure, pulse, and oxygen                        saturations were monitored continuously. The                        Colonoscope was introduced through the anus and                        advanced to the the cecum, identified by appendiceal                        orifice and ileocecal valve. The colonoscopy was                        unusually difficult due to multiple diverticula in the                        colon and a redundant colon. Successful completion of                        the procedure was aided by changing the patient to a                        supine position, changing the patient to a prone                        position and using manual pressure. Findings:      A 3 mm polyp was found in the cecum. The polyp was sessile. The polyp       was removed with a cold biopsy forceps. Resection and retrieval were       complete.      A 5 mm polyp was found in the ascending  colon. The polyp was sessile.       The polyp was removed with a cold snare. Resection and retrieval were       complete.      Multiple small and  large-mouthed diverticula were found in the sigmoid       colon, descending colon and transverse colon.      The retroflexed view of the distal rectum and anal verge was normal and       showed no anal or rectal abnormalities.      The digital rectal exam was normal.      A diffuse area of mild melanosis was found in the entire colon.      Biopsies for histology were taken with a cold forceps from the right       colon and left colon for evaluation of microscopic colitis. Impression:           - One 3 mm polyp in the cecum, removed with a cold                        biopsy forceps. Resected and retrieved.                       - One 5 mm polyp in the ascending colon, removed with a                        cold snare. Resected and retrieved.                       - Diverticulosis in the sigmoid colon, in the                        descending colon and in the transverse colon.                       - The distal rectum and anal verge are normal on                        retroflexion view. Recommendation:       - Discharge patient to home.                       - Soft diet today, then advance as tolerated to advance                        diet as tolerated. Procedure Code(s):    --- Professional ---                       [phone], Colonoscopy, flexible; with removal of tumor(s),                        polyp(s), or other lesion(s) by snare technique                       [id number], 87, Colonoscopy, flexible; with biopsy, single                        or multiple CPT copyright 2018 American Medical Association. All rights reserved. The codes documented in this report are preliminary and upon coder review may  be revised to meet current compliance requirements. Hassell Done  Kassie Mends, MD 07/09/2018 11:21:56 AM This report has been signed electronically. Number of Addenda: 0 Note Initiated On: 07/09/2018 9:57 AM Scope Withdrawal Time: 0 hours 15 minutes 32 seconds  Total Procedure Duration: 0 hours 30  minutes 17 seconds       [facility]

## 2018-07-09 NOTE — Anesthesia Post-op Follow-up Note (Signed)
Anesthesia QCDR form completed.        

## 2018-07-09 NOTE — Anesthesia Postprocedure Evaluation (Signed)
Anesthesia Post Note  Patient: Andrea Noble  Procedure(s) Performed: ESOPHAGOGASTRODUODENOSCOPY (EGD) (N/A ) COLONOSCOPY WITH PROPOFOL (N/A )  Patient location during evaluation: Endoscopy Anesthesia Type: General Level of consciousness: awake and alert and oriented Pain management: pain level controlled Vital Signs Assessment: post-procedure vital signs reviewed and stable Respiratory status: spontaneous breathing Cardiovascular status: blood pressure returned to baseline Anesthetic complications: no     Last Vitals:  Vitals:   07/09/18 1142 07/09/18 1152  BP: (!) 118/99 115/63  Pulse: 78 79  Resp: 15 16  Temp:    SpO2: 99% 100%    Last Pain:  Vitals:   07/09/18 1152  TempSrc:   PainSc: 0-No pain                 Dymir Neeson

## 2018-07-09 NOTE — H&P (Signed)
Outpatient short stay form Pre-procedure 07/09/2018 10:17 AM Lollie Sails MD  Primary Physician: Dr. Glendon Axe  Reason for visit: EGD and colonoscopy  History of present illness: Patient is a 68 year old female presenting today for an EGD and colonoscopy in regards to her personal history of colon polyps and Barrett's esophagus.  Has been taking a proton pump inhibitor but has some complaint of afternoon and variable evening heartburn symptoms.  We discussed this at length.  I would like for try to take her PPI before lunch.  She also takes thyroid medicine in the early morning.  Last colonoscopy was several years ago in [place] with a finding of a tubular adenoma in the colon. Patient has no dysphagia.   Current Facility-Administered Medications:  .  0.9 %  sodium chloride infusion, , Intravenous, Continuous, Lollie Sails, MD, Last Rate: 20 mL/hr at 07/09/18 0931, 1,000 mL at 07/09/18 0931  Medications Prior to Admission  Medication Sig Dispense Refill Last Dose  . albuterol (PROAIR HFA) 108 (90 Base) MCG/ACT inhaler ProAir HFA 90 mcg/actuation aerosol inhaler   2 puffs as needed by inhalation route.   Past Month at Unknown time  . ADVAIR DISKUS 100-50 MCG/DOSE AEPB    Taking  . buPROPion (WELLBUTRIN SR) 200 MG 12 hr tablet bupropion HCl SR 200 mg tablet,12 hr sustained-release   Taking  . calcium-vitamin D (OSCAL WITH D) 250-125 MG-UNIT tablet Take 1 tablet daily by mouth.   Taking  . conjugated estrogens (PREMARIN) vaginal cream Premarin 0.625 mg/gram vaginal cream  PRN   Taking  . diflorasone-emollient (APEXICON E) 0.05 % CREA ApexiCon E 0.05 % topical cream   Taking  . DOCOSAHEXAENOIC ACID PO Take by mouth.   Taking  . doxycycline (VIBRAMYCIN) 100 MG capsule Take 100 mg 2 (two) times daily by mouth.   Taking  . fluocinonide cream (LIDEX) 0.05 % fluocinonide 0.05 % topical cream   Taking  . fluticasone (FLONASE) 50 MCG/ACT nasal spray Flonase Allergy Relief 50  mcg/actuation nasal spray,suspension   2 sprays every day by nasal route.   Taking  . gabapentin (NEURONTIN) 100 MG capsule gabapentin 100 mg capsule   Taking  . Inulin 2 g CHEW Fiber Gummies 2 gram chewable tablet   1 tablet every day by oral route.   Taking  . levothyroxine (SYNTHROID, LEVOTHROID) 50 MCG tablet levothyroxine 50 mcg tablet   Taking  . Meclizine HCl 25 MG CHEW Meclizine Hcl Chewable 25 mg tablet   1 tablet as needed by oral route.   Taking  . montelukast (SINGULAIR) 10 MG tablet montelukast 10 mg tablet   Taking  . Multiple Vitamin (MULTIVITAMIN) tablet Take 1 tablet daily by mouth.   Taking  . ondansetron (ZOFRAN ODT) 4 MG disintegrating tablet Allow 1-2 tablets to dissolve in your mouth every 8 hours as needed for nausea/vomiting 30 tablet 0   . pantoprazole (PROTONIX) 40 MG tablet pantoprazole 40 mg tablet,delayed release  DAILY   Taking  . pravastatin (PRAVACHOL) 40 MG tablet pravastatin 40 mg tablet   Taking  . PREMPRO 0.45-1.5 MG tablet    Taking  . Probiotic Product (SOLUBLE FIBER/PROBIOTICS PO) Take by mouth.   Taking  . tetrahydrozoline-zinc (VISINE-AC) 0.05-0.25 % ophthalmic solution Allergy Relief Eye Drops 0.05 %-0.25 %   1 drop every day by ophthalmic route.   Taking  . traZODone (DESYREL) 50 MG tablet trazodone 50 mg tablet  DAILY   Taking  . Triprolidine-Pseudoephedrine (ANTIHISTAMINE PO) Take by mouth.  Taking     Allergies  Allergen Reactions  . Levofloxacin     IV Levaquin causes burning  . Other Itching    Surgical glue     Past Medical History:  Diagnosis Date  . Asthma   . Cancer ([place])    hodgkins lymphoma  . Depression   . GERD (gastroesophageal reflux disease)   . Seasonal allergies   . Thyroid disease    hypothyroid    Review of systems:      Physical Exam    Heart and lungs: Regular rate and rhythm without rub or gallop, lungs are bilaterally clear.    HEENT: Normocephalic atraumatic eyes are anicteric    Other:     Pertinant exam for procedure: Soft nontender nondistended bowel sounds positive normoactive.  Minimal discomfort in the left lower quadrant.  There is no mass or rebound.    Planned proceedures: EGD, colonoscopy and indicated procedures. I have discussed the risks benefits and complications of procedures to include not limited to bleeding, infection, perforation and the risk of sedation and the patient wishes to proceed.    Lollie Sails, MD Gastroenterology 07/09/2018  10:17 AM

## 2018-07-09 NOTE — Op Note (Signed)
[facility] Gastroenterology Patient Name: Andrea Noble Procedure Date: 07/09/2018 9:58 AM MRN: [id number] Account #: [id number] Date of Birth: 11/13/1950 Admit Type: Outpatient Age: 68 Room: [facility] ENDO ROOM 3 Gender: Female Note Status: Finalized Procedure:            Upper GI endoscopy Indications:          Gastro-esophageal reflux disease, Follow-up of                        Barrett's esophagus Providers:            Lollie Sails, MD Referring MD:         Glendon Axe (Referring MD) Medicines:            Monitored Anesthesia Care Complications:        No immediate complications. Procedure:            Pre-Anesthesia Assessment:                       - ASA Grade Assessment: III - A patient with severe                        systemic disease.                       After obtaining informed consent, the endoscope was                        passed under direct vision. Throughout the procedure,                        the patient's blood pressure, pulse, and oxygen                        saturations were monitored continuously. The Endoscope                        was introduced through the mouth, and advanced to the                        fourth part of duodenum. The patient tolerated the                        procedure well. Findings:      LA Grade A (one or more mucosal breaks less than 5 mm, not extending       between tops of 2 mucosal folds) esophagitis with no bleeding was found.       Mucosa was biopsied with a cold forceps for histology in a targeted       manner and in 4 quadrants in the lower third of the esophagus. One       specimen bottle was sent to pathology.      A few 2 to 3 mm sessile polyps with no bleeding and no stigmata of       recent bleeding were found in the gastric body. Biopsies were taken with       a cold forceps for histology.      The cardia and gastric fundus were normal on retroflexion.      The exam of the stomach was  otherwise normal.      Patchy mild mucosal variance characterized  by altered texture was found       in the entire duodenum. Biopsies were taken with a cold forceps for       histology.      the nasopharynx and oropharynx are abnormal, with evidence of candida in       the hypopharynx. Impression:           - LA Grade A reflux esophagitis. Biopsied.                       - A few gastric polyps. Biopsied.                       - Mucosal variant in the duodenum. Biopsied. Recommendation:       - Continue present medications.                       - Return to GI clinic in 4 weeks.                       - Mycelex (clotrimazole) 10 mg lozenge 5x/day for 7                        days. Procedure Code(s):    --- Professional ---                       [phone], Esophagogastroduodenoscopy, flexible, transoral;                        with biopsy, single or multiple Diagnosis Code(s):    --- Professional ---                       K21.0, Gastro-esophageal reflux disease with esophagitis                       K31.7, Polyp of stomach and duodenum                       K31.89, Other diseases of stomach and duodenum                       K22.70, Barrett's esophagus without dysplasia CPT copyright 2018 American Medical Association. All rights reserved. The codes documented in this report are preliminary and upon coder review may  be revised to meet current compliance requirements. Lollie Sails, MD 07/09/2018 10:45:19 AM This report has been signed electronically. Number of Addenda: 0 Note Initiated On: 07/09/2018 9:58 AM      [facility]

## 2018-07-09 NOTE — Anesthesia Procedure Notes (Signed)
Performed by: Gentry Fitz, CRNA Pre-anesthesia Checklist: Patient identified, Emergency Drugs available, Suction available, Patient being monitored and Timeout performed Oxygen Delivery Method: Nasal cannula Preoxygenation: Pre-oxygenation with 100% oxygen

## 2018-07-10 LAB — SURGICAL PATHOLOGY

## 2018-07-11 ENCOUNTER — Encounter: Payer: Self-pay | Admitting: Gastroenterology

## 2018-07-13 ENCOUNTER — Ambulatory Visit
Admission: RE | Admit: 2018-07-13 | Discharge: 2018-07-13 | Disposition: A | Discharge status: Home / Self Care | Payer: Medicare Other | Source: Ambulatory Visit | Attending: Internal Medicine | Admitting: Internal Medicine

## 2018-07-13 ENCOUNTER — Other Ambulatory Visit: Payer: Self-pay

## 2018-07-13 DIAGNOSIS — Z1231 Encounter for screening mammogram for malignant neoplasm of breast: Secondary | ICD-10-CM | POA: Diagnosis not present

## 2018-07-17 ENCOUNTER — Other Ambulatory Visit: Payer: Self-pay | Admitting: Internal Medicine

## 2018-07-17 DIAGNOSIS — R928 Other abnormal and inconclusive findings on diagnostic imaging of breast: Secondary | ICD-10-CM

## 2018-07-17 DIAGNOSIS — N6320 Unspecified lump in the left breast, unspecified quadrant: Secondary | ICD-10-CM

## 2018-07-23 ENCOUNTER — Ambulatory Visit
Admission: RE | Admit: 2018-07-23 | Discharge: 2018-07-23 | Disposition: A | Discharge status: Home / Self Care | Payer: Medicare Other | Source: Ambulatory Visit | Attending: Internal Medicine | Admitting: Internal Medicine

## 2018-07-23 ENCOUNTER — Other Ambulatory Visit: Payer: Self-pay

## 2018-07-23 DIAGNOSIS — R928 Other abnormal and inconclusive findings on diagnostic imaging of breast: Secondary | ICD-10-CM | POA: Diagnosis present

## 2018-07-23 DIAGNOSIS — N6320 Unspecified lump in the left breast, unspecified quadrant: Secondary | ICD-10-CM

## 2018-07-24 ENCOUNTER — Other Ambulatory Visit: Payer: Self-pay | Admitting: Internal Medicine

## 2018-07-24 DIAGNOSIS — N6320 Unspecified lump in the left breast, unspecified quadrant: Secondary | ICD-10-CM

## 2018-07-24 DIAGNOSIS — R928 Other abnormal and inconclusive findings on diagnostic imaging of breast: Secondary | ICD-10-CM

## 2018-07-27 ENCOUNTER — Ambulatory Visit
Admission: RE | Admit: 2018-07-27 | Discharge: 2018-07-27 | Disposition: A | Discharge status: Home / Self Care | Payer: Medicare Other | Source: Ambulatory Visit | Attending: Internal Medicine | Admitting: Internal Medicine

## 2018-07-27 ENCOUNTER — Other Ambulatory Visit: Payer: Self-pay

## 2018-07-27 ENCOUNTER — Other Ambulatory Visit: Payer: Self-pay | Admitting: Internal Medicine

## 2018-07-27 DIAGNOSIS — N6320 Unspecified lump in the left breast, unspecified quadrant: Secondary | ICD-10-CM | POA: Diagnosis present

## 2018-07-27 DIAGNOSIS — R928 Other abnormal and inconclusive findings on diagnostic imaging of breast: Secondary | ICD-10-CM | POA: Diagnosis present

## 2018-07-27 HISTORY — PX: BREAST BIOPSY: SHX20

## 2018-07-30 LAB — SURGICAL PATHOLOGY

## 2018-12-13 ENCOUNTER — Other Ambulatory Visit: Payer: Self-pay | Admitting: Internal Medicine

## 2018-12-13 DIAGNOSIS — N6320 Unspecified lump in the left breast, unspecified quadrant: Secondary | ICD-10-CM

## 2018-12-13 DIAGNOSIS — R928 Other abnormal and inconclusive findings on diagnostic imaging of breast: Secondary | ICD-10-CM

## 2019-01-28 ENCOUNTER — Other Ambulatory Visit: Payer: Medicare Other

## 2019-02-27 ENCOUNTER — Ambulatory Visit
Admission: RE | Admit: 2019-02-27 | Discharge: 2019-02-27 | Disposition: A | Discharge status: Home / Self Care | Payer: Medicare Other | Source: Ambulatory Visit | Attending: Internal Medicine | Admitting: Internal Medicine

## 2019-02-27 DIAGNOSIS — R928 Other abnormal and inconclusive findings on diagnostic imaging of breast: Secondary | ICD-10-CM | POA: Insufficient documentation

## 2019-02-27 DIAGNOSIS — N6320 Unspecified lump in the left breast, unspecified quadrant: Secondary | ICD-10-CM | POA: Insufficient documentation

## 2019-04-18 ENCOUNTER — Other Ambulatory Visit: Payer: Self-pay | Admitting: Internal Medicine

## 2019-04-18 ENCOUNTER — Ambulatory Visit
Admission: RE | Admit: 2019-04-18 | Discharge: 2019-04-18 | Disposition: A | Discharge status: Home / Self Care | Payer: Medicare Other | Source: Ambulatory Visit | Attending: Internal Medicine | Admitting: Internal Medicine

## 2019-04-18 ENCOUNTER — Other Ambulatory Visit: Payer: Self-pay

## 2019-04-18 DIAGNOSIS — Z8571 Personal history of Hodgkin lymphoma: Secondary | ICD-10-CM

## 2019-04-18 DIAGNOSIS — R1030 Lower abdominal pain, unspecified: Secondary | ICD-10-CM | POA: Insufficient documentation

## 2019-04-18 LAB — POCT I-STAT CREATININE: Creatinine, Ser: 0.8 mg/dL (ref 0.44–1.00)

## 2019-04-18 MED ORDER — IOHEXOL 300 MG/ML  SOLN
75.0000 mL | Freq: Once | INTRAMUSCULAR | Status: AC | PRN
  Administered 2019-04-18: 75 mL via INTRAVENOUS

## 2019-06-05 ENCOUNTER — Other Ambulatory Visit: Payer: Self-pay | Admitting: Internal Medicine

## 2019-06-05 DIAGNOSIS — Z8571 Personal history of Hodgkin lymphoma: Secondary | ICD-10-CM

## 2019-06-21 ENCOUNTER — Other Ambulatory Visit: Payer: Self-pay

## 2019-06-21 ENCOUNTER — Ambulatory Visit
Admission: RE | Admit: 2019-06-21 | Discharge: 2019-06-21 | Disposition: A | Discharge status: Home / Self Care | Payer: Medicare Other | Source: Ambulatory Visit | Attending: Internal Medicine | Admitting: Internal Medicine

## 2019-06-21 DIAGNOSIS — Z8571 Personal history of Hodgkin lymphoma: Secondary | ICD-10-CM | POA: Insufficient documentation

## 2019-06-21 MED ORDER — IOHEXOL 300 MG/ML  SOLN
75.0000 mL | Freq: Once | INTRAMUSCULAR | Status: AC | PRN
  Administered 2019-06-21: 09:00:00 75 mL via INTRAVENOUS

## 2019-06-26 LAB — POCT I-STAT CREATININE: Creatinine, Ser: 0.9 mg/dL (ref 0.44–1.00)

## 2019-07-04 ENCOUNTER — Other Ambulatory Visit: Payer: Self-pay

## 2019-07-04 ENCOUNTER — Ambulatory Visit
Admission: EM | Admit: 2019-07-04 | Discharge: 2019-07-04 | Disposition: A | Discharge status: Home / Self Care | Payer: Medicare Other

## 2019-07-04 DIAGNOSIS — M25511 Pain in right shoulder: Secondary | ICD-10-CM | POA: Diagnosis not present

## 2019-07-04 NOTE — Discharge Instructions (Addendum)
Go to [facility] for evaluation of your acute shoulder pain.

## 2019-07-04 NOTE — ED Provider Notes (Signed)
[name]    CSN: [id number] Arrival date & time: 07/04/19  1652      History   Chief Complaint Chief Complaint  Patient presents with  . Shoulder Pain    HPI Andrea Noble is a 69 y.o. female.   Patient presents with acute right shoulder pain x1 hour.  She states she was reaching down and felt a sharp pain in her shoulder.  She is unable to lift or move her shoulder without acute pain.  She denies numbness, tingling, weakness in her fingers or arm.  No wounds, rash, fever, or other symptoms.    The history is provided by the patient.    Past Medical History:  Diagnosis Date  . Asthma   . Cancer ([place])    hodgkins lymphoma  . Depression   . GERD (gastroesophageal reflux disease)   . Seasonal allergies   . Thyroid disease    hypothyroid    Patient Active Problem List   Diagnosis Date Noted  . History of Hodgkin's lymphoma 03/01/2017    Past Surgical History:  Procedure Laterality Date  . BREAST BIOPSY     benign  . BREAST BIOPSY  07/27/2018   [place] core bx venus COLUMNAR CELL METAPLASIA, AND PSEUDOANGIOMATOUS STROMAL  . CATARACT EXTRACTION Bilateral   . COLONOSCOPY WITH PROPOFOL N/A 07/09/2018   Procedure: COLONOSCOPY WITH PROPOFOL;  Surgeon: Lollie Sails, MD;  Location: [facility] ENDOSCOPY;  Service: Endoscopy;  Laterality: N/A;  . ESOPHAGOGASTRODUODENOSCOPY N/A 07/09/2018   Procedure: ESOPHAGOGASTRODUODENOSCOPY (EGD);  Surgeon: Lollie Sails, MD;  Location: [facility] ENDOSCOPY;  Service: Endoscopy;  Laterality: N/A;    OB History   No obstetric history on file.      Home Medications    Prior to Admission medications   Medication Sig Start Date End Date Taking? Authorizing Provider  ADVAIR DISKUS 100-50 MCG/DOSE AEPB  12/03/16  Yes [provider]  albuterol (PROAIR HFA) 108 (90 Base) MCG/ACT inhaler ProAir HFA 90 mcg/actuation aerosol inhaler   2 puffs as needed by inhalation route.   Yes [provider]  buPROPion (WELLBUTRIN SR)  200 MG 12 hr tablet bupropion HCl SR 200 mg tablet,12 hr sustained-release   Yes [provider]  calcium-vitamin D (OSCAL WITH D) 250-125 MG-UNIT tablet Take 1 tablet daily by mouth.   Yes [provider]  conjugated estrogens (PREMARIN) vaginal cream Premarin 0.625 mg/gram vaginal cream  PRN   Yes [provider]  diflorasone-emollient (APEXICON E) 0.05 % CREA ApexiCon E 0.05 % topical cream   Yes [provider]  fluocinonide cream (LIDEX) 0.05 % fluocinonide 0.05 % topical cream   Yes [provider]  fluticasone (FLONASE) 50 MCG/ACT nasal spray Flonase Allergy Relief 50 mcg/actuation nasal spray,suspension   2 sprays every day by nasal route. 07/16/12  Yes [provider]  gabapentin (NEURONTIN) 100 MG capsule gabapentin 100 mg capsule   Yes [provider]  levothyroxine (SYNTHROID, LEVOTHROID) 50 MCG tablet levothyroxine 50 mcg tablet   Yes [provider]  Meclizine HCl 25 MG CHEW Meclizine Hcl Chewable 25 mg tablet   1 tablet as needed by oral route. 07/16/05  Yes [provider]  montelukast (SINGULAIR) 10 MG tablet montelukast 10 mg tablet   Yes [provider]  Multiple Vitamin (MULTIVITAMIN) tablet Take 1 tablet daily by mouth.   Yes [provider]  ondansetron (ZOFRAN ODT) 4 MG disintegrating tablet Allow 1-2 tablets to dissolve in your mouth every 8 hours as needed  for nausea/vomiting 06/24/18  Yes Hinda Kehr, MD  pantoprazole (PROTONIX) 40 MG tablet pantoprazole 40 mg tablet,delayed release  DAILY   Yes [provider]  pravastatin (PRAVACHOL) 40 MG tablet pravastatin 40 mg tablet   Yes [provider]  PREMPRO 0.45-1.5 MG tablet  12/24/16  Yes [provider]  Probiotic Product (SOLUBLE FIBER/PROBIOTICS PO) Take by mouth.   Yes [provider]  tetrahydrozoline-zinc (VISINE-AC) 0.05-0.25 % ophthalmic solution Allergy Relief Eye Drops 0.05 %-0.25  %   1 drop every day by ophthalmic route. 07/16/08  Yes [provider]  traZODone (DESYREL) 50 MG tablet trazodone 50 mg tablet  DAILY   Yes [provider]  Triprolidine-Pseudoephedrine (ANTIHISTAMINE PO) Take by mouth.   Yes [provider]  DOCOSAHEXAENOIC ACID PO Take by mouth.    [provider]  doxycycline (VIBRAMYCIN) 100 MG capsule Take 100 mg 2 (two) times daily by mouth.    [provider]  Inulin 2 g CHEW Fiber Gummies 2 gram chewable tablet   1 tablet every day by oral route. 07/16/13   [provider]    Family History Family History  Problem Relation Age of Onset  . Breast cancer Maternal Aunt   . Anuerysm Mother   . Heart attack Father   . Alzheimer's disease Father     Social History Social History   Tobacco Use  . Smoking status: Never Smoker  . Smokeless tobacco: Never Used  Substance Use Topics  . Alcohol use: No  . Drug use: No     Allergies   Levofloxacin and Other   Review of Systems Review of Systems  Constitutional: Negative for chills and fever.  HENT: Negative for ear pain and sore throat.   Eyes: Negative for pain and visual disturbance.  Respiratory: Negative for cough and shortness of breath.   Cardiovascular: Negative for chest pain and palpitations.  Gastrointestinal: Negative for abdominal pain and vomiting.  Genitourinary: Negative for dysuria and hematuria.  Musculoskeletal: Positive for arthralgias. Negative for back pain.  Skin: Negative for color change and rash.  Neurological: Negative for seizures, syncope, weakness and numbness.  All other systems reviewed and are negative.    Physical Exam Triage Vital Signs ED Triage Vitals  Enc Vitals Group     BP 07/04/19 1700 136/72     Pulse Rate 07/04/19 1700 (!) 105     Resp 07/04/19 1700 18     Temp 07/04/19 1700 98.6 F (37 C)     Temp Source 07/04/19 1700 Oral     SpO2 07/04/19 1700 96 %     Weight --      Height --       Head Circumference --      Peak Flow --      Pain Score 07/04/19 1659 9     Pain Loc --      Pain Edu? --      Excl. in [place]? --    No data found.  Updated Vital Signs BP 136/72 (BP Location: Left Arm)   Pulse (!) 105   Temp 98.6 F (37 C) (Oral)   Resp 18   SpO2 96%   Visual Acuity Right Eye Distance:   Left Eye Distance:   Bilateral Distance:    Right Eye Near:   Left Eye Near:    Bilateral Near:     Physical Exam Vitals and nursing note reviewed.  Constitutional:      General: She is not  in acute distress.    Appearance: She is well-developed.  HENT:     Head: Normocephalic and atraumatic.     Mouth/Throat:     Mouth: Mucous membranes are moist.  Eyes:     Conjunctiva/sclera: Conjunctivae normal.  Cardiovascular:     Rate and Rhythm: Normal rate and regular rhythm.     Heart sounds: No murmur.  Pulmonary:     Effort: Pulmonary effort is normal. No respiratory distress.     Breath sounds: Normal breath sounds.  Abdominal:     Palpations: Abdomen is soft.     Tenderness: There is no abdominal tenderness.  Musculoskeletal:        General: No deformity.     Cervical back: Neck supple.     Comments: Right shoulder ROM limited by pain.  RUE: 2+ pulses, sensation intact.    Skin:    General: Skin is warm and dry.     Capillary Refill: Capillary refill takes less than 2 seconds.     Findings: No bruising, erythema, lesion or rash.  Neurological:     Mental Status: She is alert and oriented to person, place, and time.     Sensory: No sensory deficit.  Psychiatric:        Mood and Affect: Mood normal.        Behavior: Behavior normal.      UC Treatments / Results  Labs (all labs ordered are listed, but only abnormal results are displayed) Labs Reviewed - No data to display  EKG   Radiology No results found.  Procedures Procedures (including critical care time)  Medications Ordered in UC Medications - No data to display  Initial Impression /  Assessment and Plan / UC Course  I have reviewed the triage vital signs and the nursing notes.  Pertinent labs & imaging results that were available during my care of the patient were reviewed by me and considered in my medical decision making (see chart for details).    Right shoulder pain.  Discussed with patient that she needs imaging which is not available on-site here; discussed options with patient and she opts to go to [facility] for evaluation.  Patient's husband drove her here today and will take her there.     Final Clinical Impressions(s) / UC Diagnoses   Final diagnoses:  Pain in joint of right shoulder     Discharge Instructions     Go to [facility] for evaluation of your acute shoulder pain.         ED Prescriptions    None     PDMP not reviewed this encounter.   Sharion Balloon, NP 07/04/19 1725

## 2019-07-04 NOTE — ED Triage Notes (Signed)
Patient states that around 4pm she was getting out of the car and reached down to get her Gatorade bottle and felt something pull in her right shoulder and has been unable to lift arm or shoulder at this time. Patient states that pain has continued to be constant.

## 2019-10-14 DIAGNOSIS — M7551 Bursitis of right shoulder: Secondary | ICD-10-CM | POA: Insufficient documentation

## 2019-10-22 ENCOUNTER — Ambulatory Visit: Payer: Medicare Other | Admitting: Physical Therapy

## 2019-10-24 ENCOUNTER — Encounter: Payer: Medicare Other | Admitting: Physical Therapy

## 2019-10-28 ENCOUNTER — Encounter: Payer: Medicare Other | Admitting: Physical Therapy

## 2019-10-30 ENCOUNTER — Encounter: Payer: Medicare Other | Admitting: Physical Therapy

## 2020-01-07 ENCOUNTER — Other Ambulatory Visit: Payer: Self-pay | Admitting: Physical Medicine and Rehabilitation

## 2020-01-07 DIAGNOSIS — M5416 Radiculopathy, lumbar region: Secondary | ICD-10-CM

## 2020-01-07 DIAGNOSIS — M5412 Radiculopathy, cervical region: Secondary | ICD-10-CM

## 2020-01-17 IMAGING — MG DIGITAL SCREENING BILATERAL MAMMOGRAM WITH TOMO AND CAD
8 series · 9 of 24 positions shown · non-contrast
Comparison: Previous exam(s).

CLINICAL DATA: Screening.

EXAM:
DIGITAL SCREENING BILATERAL MAMMOGRAM WITH TOMO AND CAD

[L CC synth-2D]
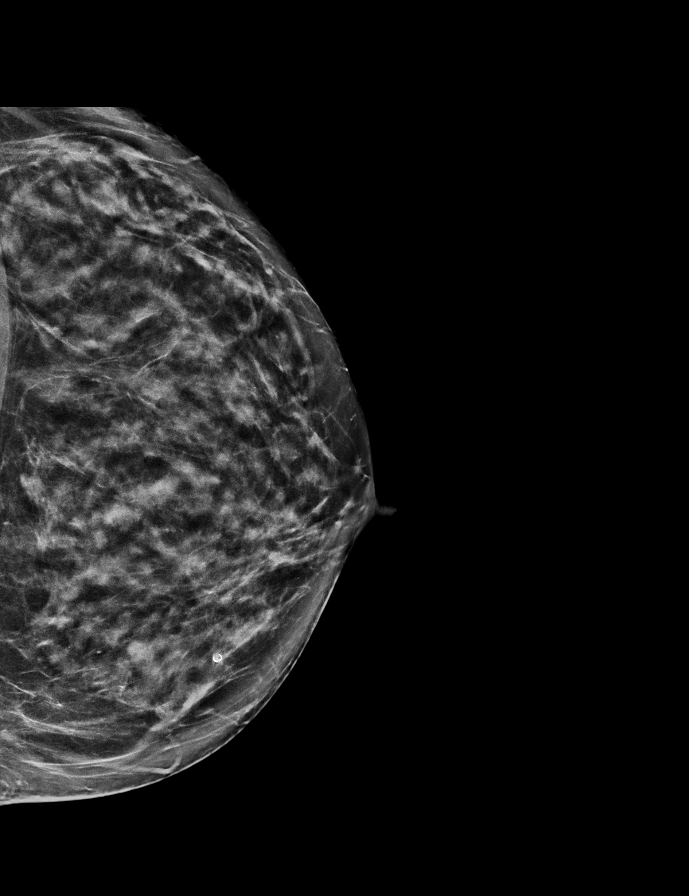

[R CC synth-2D]
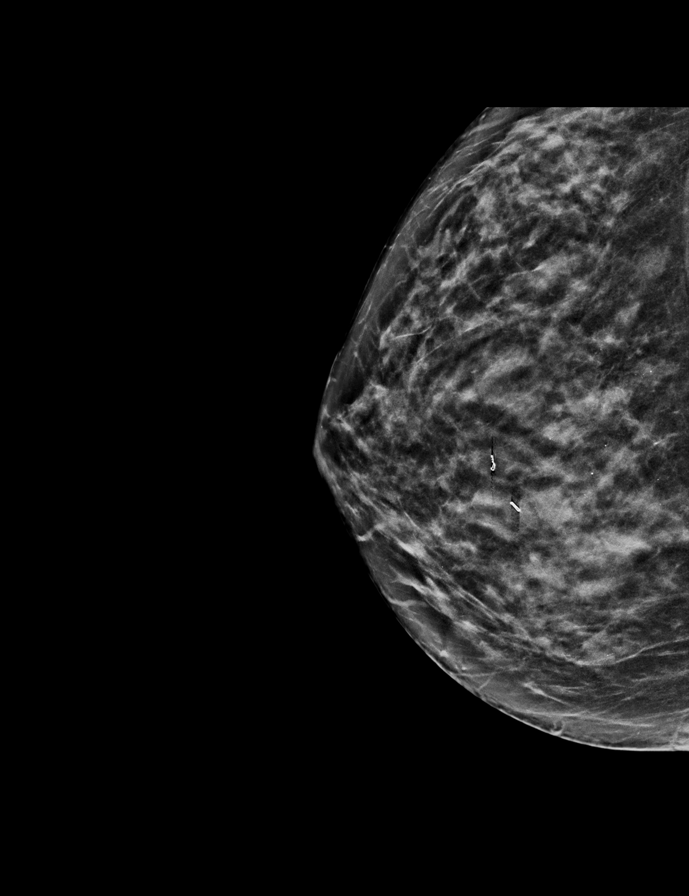

[R MLO synth-2D]
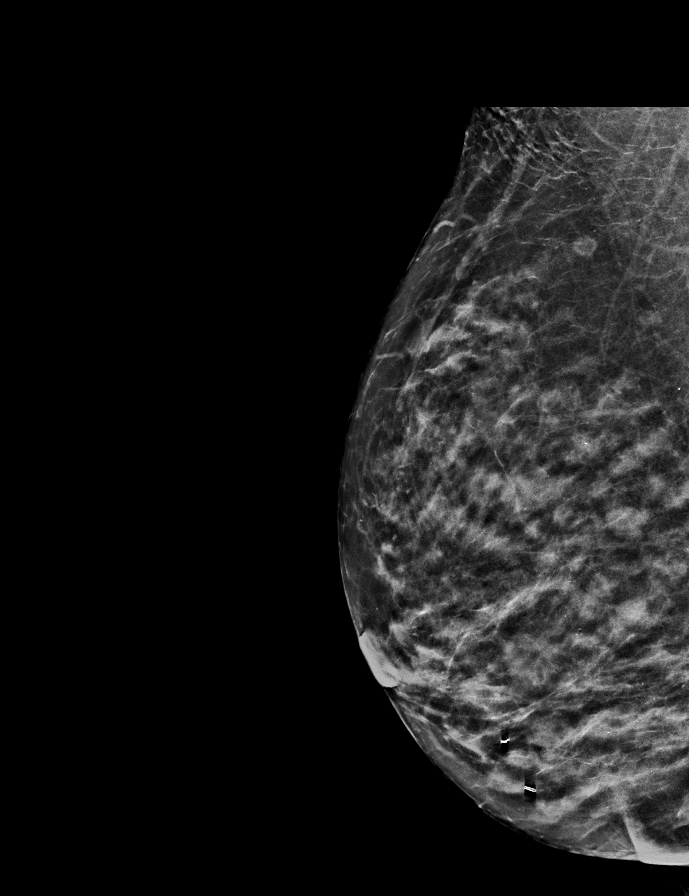

[L MLO synth-2D]
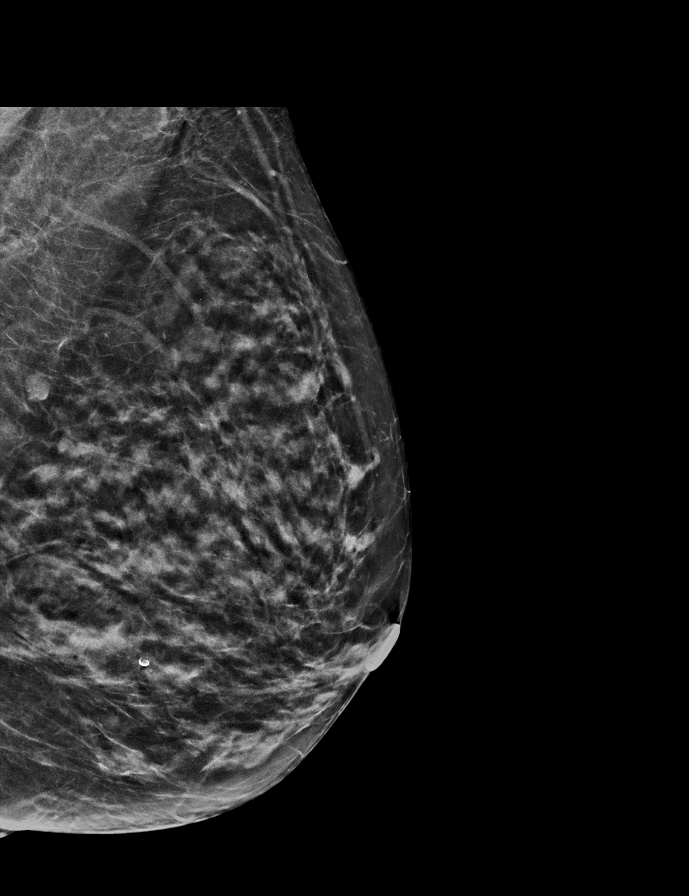

[R CC tomo · 2 of 61 frames shown]
[frame 20/61]
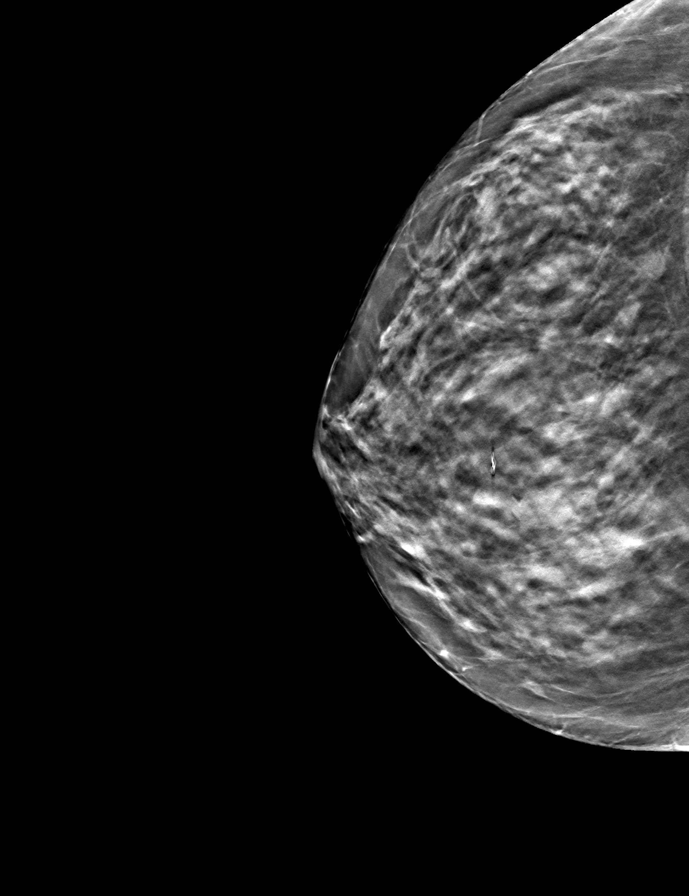
[frame 31/61]
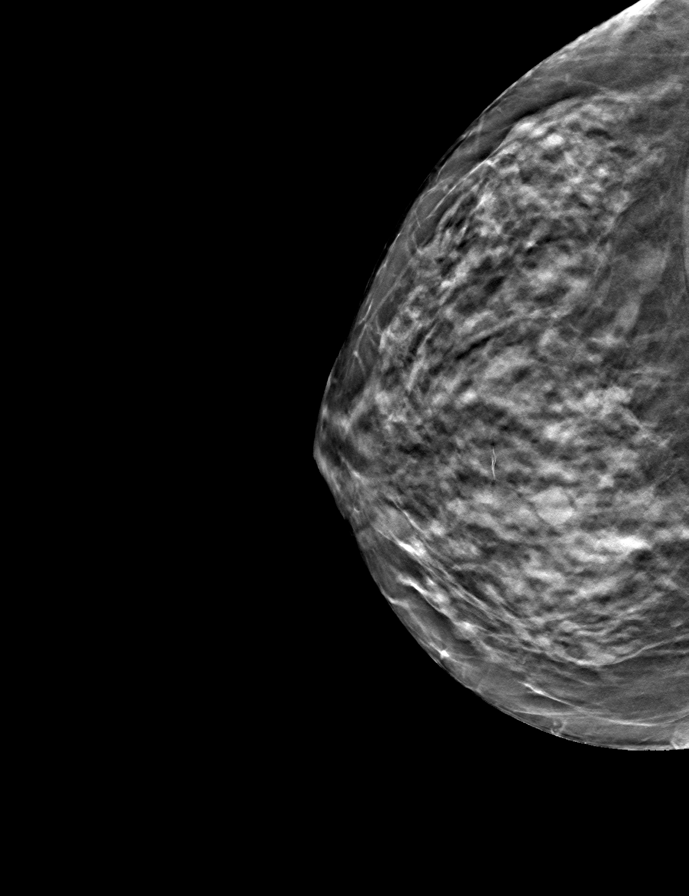

[L CC tomo · tomo slice 33/64.0]
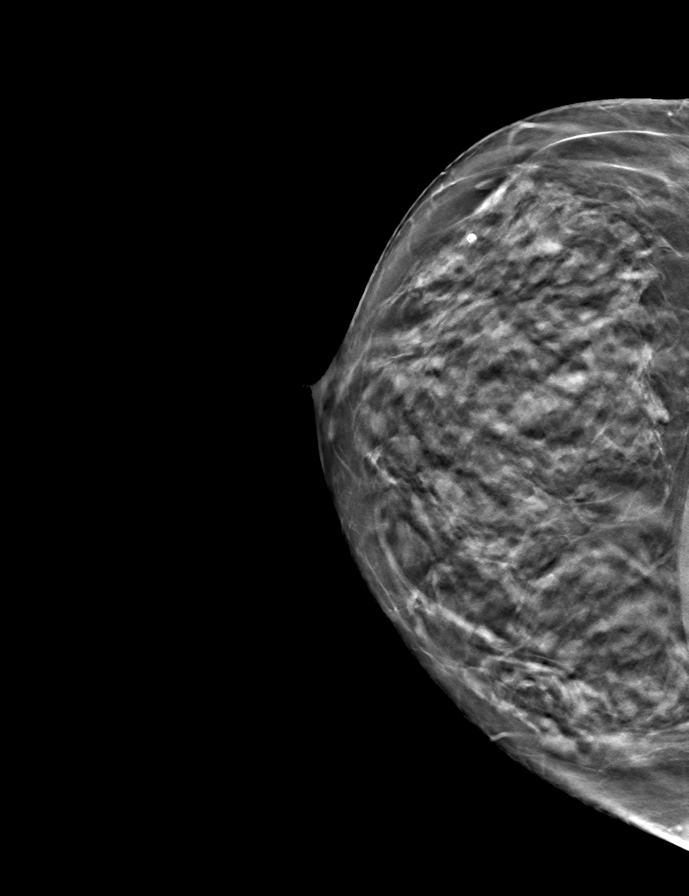

[L MLO tomo · tomo slice 29/58.0]
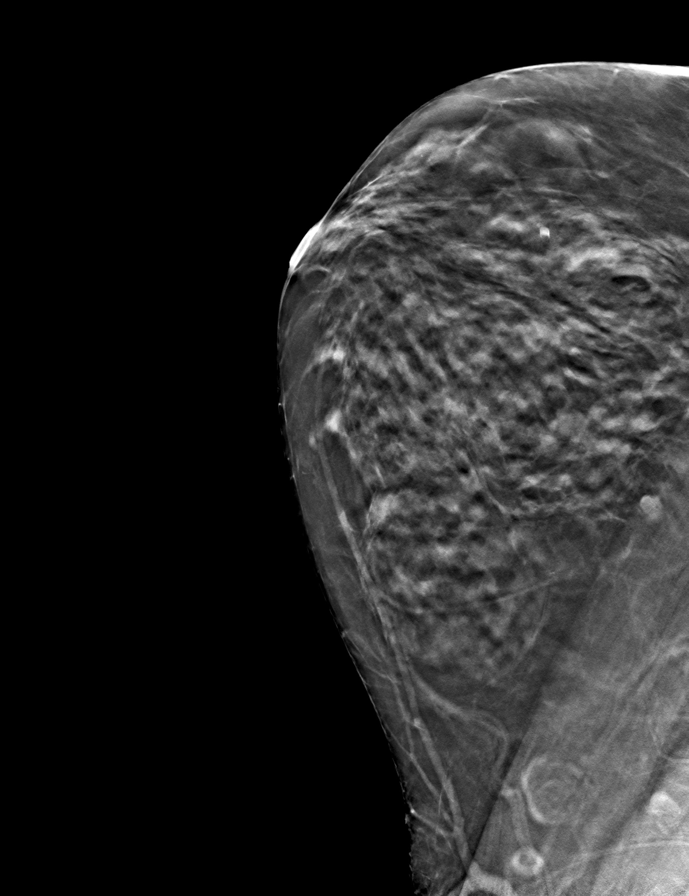

[R MLO tomo · tomo slice 29/58.0]
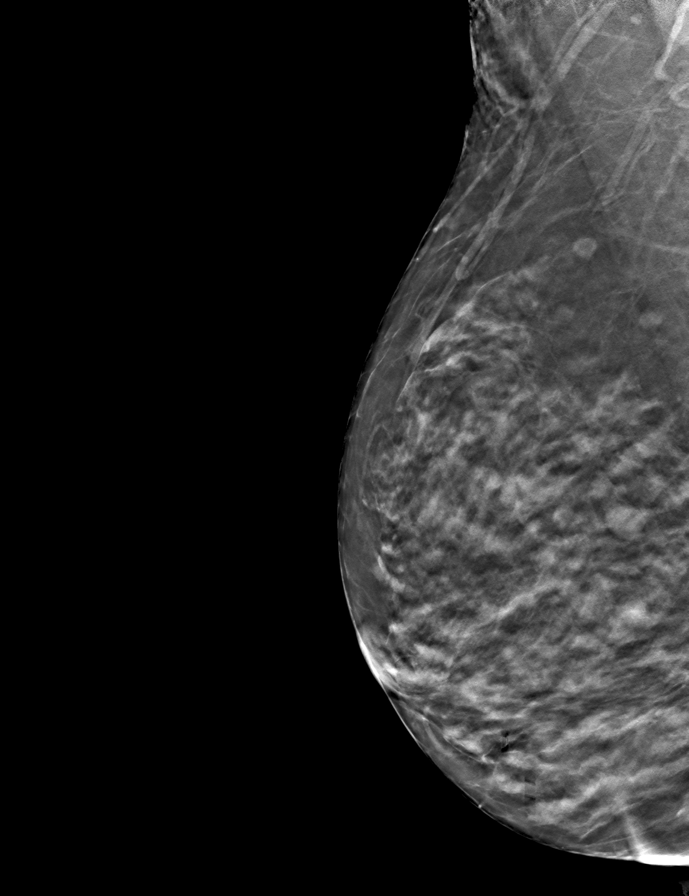

[9 of 24 positions shown; findings below may reference images not displayed]

ACR Breast Density Category c: The breast tissue is heterogeneously
dense, which may obscure small masses.
FINDINGS: In the left breast, a possible mass warrants further evaluation. In
the right breast, no findings suspicious for malignancy.

Images were processed with CAD.
IMPRESSION: Further evaluation is suggested for possible mass in the left
breast.

RECOMMENDATION:
Diagnostic mammogram and possibly ultrasound of the left breast.
(Code:[id number])

The patient will be contacted regarding the findings, and additional
imaging will be scheduled.

BI-RADS CATEGORY  0: Incomplete. Need additional imaging evaluation
and/or prior mammograms for comparison.

## 2020-01-29 ENCOUNTER — Ambulatory Visit
Admission: RE | Admit: 2020-01-29 | Discharge: 2020-01-29 | Disposition: A | Discharge status: Home / Self Care | Payer: Medicare Other | Source: Ambulatory Visit | Attending: Physical Medicine and Rehabilitation | Admitting: Physical Medicine and Rehabilitation

## 2020-01-29 ENCOUNTER — Other Ambulatory Visit: Payer: Self-pay

## 2020-01-29 DIAGNOSIS — M5412 Radiculopathy, cervical region: Secondary | ICD-10-CM

## 2020-01-29 DIAGNOSIS — M5416 Radiculopathy, lumbar region: Secondary | ICD-10-CM

## 2020-03-13 ENCOUNTER — Other Ambulatory Visit: Payer: Self-pay | Admitting: Internal Medicine

## 2020-03-13 DIAGNOSIS — Z1231 Encounter for screening mammogram for malignant neoplasm of breast: Secondary | ICD-10-CM

## 2020-03-31 ENCOUNTER — Ambulatory Visit
Admission: RE | Admit: 2020-03-31 | Discharge: 2020-03-31 | Disposition: A | Discharge status: Home / Self Care | Payer: Medicare Other | Source: Ambulatory Visit | Attending: Internal Medicine | Admitting: Internal Medicine

## 2020-03-31 ENCOUNTER — Other Ambulatory Visit: Payer: Self-pay

## 2020-03-31 DIAGNOSIS — Z1231 Encounter for screening mammogram for malignant neoplasm of breast: Secondary | ICD-10-CM | POA: Insufficient documentation

## 2020-05-14 ENCOUNTER — Other Ambulatory Visit: Payer: Self-pay | Admitting: Neurosurgery

## 2020-06-02 ENCOUNTER — Other Ambulatory Visit: Payer: Self-pay

## 2020-06-02 ENCOUNTER — Encounter
Admission: RE | Admit: 2020-06-02 | Discharge: 2020-06-02 | Disposition: A | Discharge status: Home / Self Care | Payer: Medicare Other | Source: Ambulatory Visit | Attending: Neurosurgery | Admitting: Neurosurgery

## 2020-06-02 DIAGNOSIS — I471 Supraventricular tachycardia: Secondary | ICD-10-CM | POA: Diagnosis not present

## 2020-06-02 DIAGNOSIS — Z01818 Encounter for other preprocedural examination: Secondary | ICD-10-CM | POA: Insufficient documentation

## 2020-06-02 DIAGNOSIS — R54 Age-related physical debility: Secondary | ICD-10-CM | POA: Insufficient documentation

## 2020-06-02 HISTORY — DX: Unspecified cataract: H26.9

## 2020-06-02 HISTORY — DX: Pneumonia, unspecified organism: J18.9

## 2020-06-02 HISTORY — DX: Supraventricular tachycardia, unspecified: I47.10

## 2020-06-02 HISTORY — DX: Anxiety disorder, unspecified: F41.9

## 2020-06-02 HISTORY — DX: Supraventricular tachycardia: I47.1

## 2020-06-02 HISTORY — DX: Hypothyroidism, unspecified: E03.9

## 2020-06-02 HISTORY — DX: Unspecified osteoarthritis, unspecified site: M19.90

## 2020-06-02 HISTORY — DX: Diverticulitis of intestine, part unspecified, without perforation or abscess without bleeding: K57.92

## 2020-06-02 HISTORY — DX: Hyperlipidemia, unspecified: E78.5

## 2020-06-02 LAB — CBC
HCT: 37.8 % (ref 36.0–46.0)
Hemoglobin: 12.9 g/dL (ref 12.0–15.0)
MCH: 31.6 pg (ref 26.0–34.0)
MCHC: 34.1 g/dL (ref 30.0–36.0)
MCV: 92.6 fL (ref 80.0–100.0)
Platelets: 276 10*3/uL (ref 150–400)
RBC: 4.08 MIL/uL (ref 3.87–5.11)
RDW: 13 % (ref 11.5–15.5)
WBC: 5 10*3/uL (ref 4.0–10.5)
nRBC: 0 % (ref 0.0–0.2)

## 2020-06-02 LAB — APTT: aPTT: 28 seconds (ref 24–36)

## 2020-06-02 LAB — BASIC METABOLIC PANEL
Anion gap: 9 (ref 5–15)
BUN: 9 mg/dL (ref 8–23)
CO2: 28 mmol/L (ref 22–32)
Calcium: 9.3 mg/dL (ref 8.9–10.3)
Chloride: 94 mmol/L — ABNORMAL LOW (ref 98–111)
Creatinine, Ser: 0.83 mg/dL (ref 0.44–1.00)
GFR, Estimated: >60 mL/min (ref >60)
Glucose, Bld: 81 mg/dL (ref 70–99)
Potassium: 4.1 mmol/L (ref 3.5–5.1)
Sodium: 131 mmol/L — ABNORMAL LOW (ref 135–145)

## 2020-06-02 LAB — URINALYSIS, ROUTINE W REFLEX MICROSCOPIC
Bacteria, UA: NONE SEEN
Bilirubin Urine: NEGATIVE
Glucose, UA: NEGATIVE mg/dL
Ketones, ur: NEGATIVE mg/dL
Leukocytes,Ua: NEGATIVE
Nitrite: NEGATIVE
Protein, ur: NEGATIVE mg/dL
Specific Gravity, Urine: 1.006 (ref 1.005–1.030)
Squamous Epithelial / HPF: NONE SEEN (ref 0–5)
pH: 7 (ref 5.0–8.0)

## 2020-06-02 LAB — SURGICAL PCR SCREEN
MRSA, PCR: NEGATIVE
Staphylococcus aureus: NEGATIVE

## 2020-06-02 LAB — TYPE AND SCREEN
ABO/RH(D): O POS
Antibody Screen: NEGATIVE

## 2020-06-02 LAB — PROTIME-INR
INR: 1 (ref 0.8–1.2)
Prothrombin Time: 12.5 seconds (ref 11.4–15.2)

## 2020-06-02 NOTE — Patient Instructions (Addendum)
Your procedure is scheduled on:  Monday, February 7 Report to the Registration Desk on the 1st floor of the [facility]. To find out your arrival time, please call [phone] between 1PM - 3PM on: Friday, February 4  REMEMBER: Instructions that are not followed completely may result in serious medical risk, up to and including death; or upon the discretion of your surgeon and anesthesiologist your surgery may need to be rescheduled.  Do not eat food after midnight the night before surgery.  No gum chewing, lozengers or hard candies.  You may however, drink CLEAR liquids up to 2 hours before you are scheduled to arrive for your surgery. Do not drink anything within 2 hours of your scheduled arrival time.  Clear liquids include: - water  - apple juice without pulp - gatorade (not RED, PURPLE, OR BLUE) - black coffee or tea (Do NOT add milk or creamers to the coffee or tea) Do NOT drink anything that is not on this list.  TAKE THESE MEDICATIONS THE MORNING OF SURGERY WITH A SIP OF WATER:  1.  advair diskus inhaler 2.  Bupropion 3.  Gabapentin 4.  Levothyroxine 5.  Liothyronine 6.  Pantoprazole  Use inhalers on the day of surgery and bring to the hospital.  One week prior to surgery: starting January 31 Stop Anti-inflammatories (NSAIDS) such as Advil, Aleve, Ibuprofen, Motrin, Naproxen, Naprosyn and Aspirin based products such as Excedrin, Goodys Powder, BC Powder. Stop ALL OVER THE COUNTER supplements until after surgery.  No Alcohol for 24 hours before or after surgery.  On the morning of surgery brush your teeth with toothpaste and water, you may rinse your mouth with mouthwash if you wish. Do not swallow any toothpaste or mouthwash.  Do not wear jewelry, make-up, hairpins, clips or nail polish.  Do not wear lotions, powders, or perfumes.   Do not shave body from the neck down 48 hours prior to surgery just in case you cut yourself which could leave a site for  infection.  Also, freshly shaved skin may become irritated if using the CHG soap.  Contact lenses, hearing aids and dentures may not be worn into surgery.  Do not bring valuables to the hospital. [facility] is not responsible for any missing/lost belongings or valuables.   Use CHG Soap as directed on instruction sheet.  Notify your doctor if there is any change in your medical condition (cold, fever, infection).  Wear comfortable clothing (specific to your surgery type) to the hospital.  Plan for stool softeners for home use; pain medications have a tendency to cause constipation. You can also help prevent constipation by eating foods high in fiber such as fruits and vegetables and drinking plenty of fluids as your diet allows.  After surgery, you can help prevent lung complications by doing breathing exercises.  Take deep breaths and cough every 1-2 hours. Your doctor may order a device called an Incentive Spirometer to help you take deep breaths. When coughing or sneezing, hold a pillow firmly against your incision with both hands. This is called "splinting." Doing this helps protect your incision. It also decreases belly discomfort.  If you are being admitted to the hospital overnight, leave your suitcase in the car. After surgery it may be brought to your room.  If you are being discharged the day of surgery, you will not be allowed to drive home. You will need a responsible adult (18 years or older) to drive you home and stay with you that  night.   If you are taking public transportation, you will need to have a responsible adult (18 years or older) with you. Please confirm with your physician that it is acceptable to use public transportation.   Please call the [facility] at [phone] if you have any questions about these instructions.  Visitation Policy:  Patients undergoing a surgery or procedure may have one family member or support person with them  as long as that person is not COVID-19 positive or experiencing its symptoms.  That person may remain in the waiting area during the procedure.  Inpatient Visitation:    Visiting hours are 7 a.m. to 8 p.m. Patients will be allowed one visitor. The visitor may change daily. The visitor must pass COVID-19 screenings, use hand sanitizer when entering and exiting the patient's room and wear a mask at all times, including in the patient's room. Patients must also wear a mask when staff or their visitor are in the room. Masking is required regardless of vaccination status. Systemwide, no visitors 17 or younger.

## 2020-06-04 ENCOUNTER — Other Ambulatory Visit: Payer: Medicare Other

## 2020-07-02 ENCOUNTER — Other Ambulatory Visit
Admission: RE | Admit: 2020-07-02 | Discharge: 2020-07-02 | Disposition: A | Discharge status: Home / Self Care | Payer: Medicare Other | Source: Ambulatory Visit | Attending: Neurosurgery | Admitting: Neurosurgery

## 2020-07-02 ENCOUNTER — Other Ambulatory Visit: Payer: Self-pay

## 2020-07-02 DIAGNOSIS — Z01812 Encounter for preprocedural laboratory examination: Secondary | ICD-10-CM | POA: Diagnosis present

## 2020-07-02 DIAGNOSIS — Z20822 Contact with and (suspected) exposure to covid-19: Secondary | ICD-10-CM | POA: Insufficient documentation

## 2020-07-02 LAB — SARS CORONAVIRUS 2 (TAT 6-24 HRS): SARS Coronavirus 2: NEGATIVE

## 2020-07-05 MED ORDER — LACTATED RINGERS IV SOLN: INTRAVENOUS | Status: DC

## 2020-07-05 MED ORDER — ORAL CARE MOUTH RINSE: 15.0000 mL | Freq: Once | OROMUCOSAL | Status: AC

## 2020-07-05 MED ORDER — CHLORHEXIDINE GLUCONATE 0.12 % MT SOLN: 15.0000 mL | Freq: Once | OROMUCOSAL | Status: AC

## 2020-07-06 ENCOUNTER — Other Ambulatory Visit: Payer: Self-pay

## 2020-07-06 ENCOUNTER — Inpatient Hospital Stay: Payer: Medicare Other | Admitting: Urgent Care

## 2020-07-06 ENCOUNTER — Inpatient Hospital Stay
Admission: RE | Admit: 2020-07-06 | Discharge: 2020-07-09 | DRG: 455 | Disposition: A | Discharge status: Skilled Nursing Facility | Payer: Medicare Other | Attending: Neurosurgery | Admitting: Neurosurgery

## 2020-07-06 ENCOUNTER — Encounter
Admission: RE | Disposition: A | Discharge status: Skilled Nursing Facility | Payer: Self-pay | Source: Home / Self Care | Attending: Neurosurgery

## 2020-07-06 ENCOUNTER — Encounter: Payer: Self-pay | Admitting: Neurosurgery

## 2020-07-06 ENCOUNTER — Inpatient Hospital Stay: Payer: Medicare Other

## 2020-07-06 DIAGNOSIS — Z8571 Personal history of Hodgkin lymphoma: Secondary | ICD-10-CM | POA: Diagnosis not present

## 2020-07-06 DIAGNOSIS — Z881 Allergy status to other antibiotic agents status: Secondary | ICD-10-CM

## 2020-07-06 DIAGNOSIS — R3915 Urgency of urination: Secondary | ICD-10-CM | POA: Diagnosis present

## 2020-07-06 DIAGNOSIS — K219 Gastro-esophageal reflux disease without esophagitis: Secondary | ICD-10-CM | POA: Diagnosis present

## 2020-07-06 DIAGNOSIS — Z91048 Other nonmedicinal substance allergy status: Secondary | ICD-10-CM | POA: Diagnosis not present

## 2020-07-06 DIAGNOSIS — M1990 Unspecified osteoarthritis, unspecified site: Secondary | ICD-10-CM | POA: Diagnosis present

## 2020-07-06 DIAGNOSIS — J302 Other seasonal allergic rhinitis: Secondary | ICD-10-CM | POA: Diagnosis present

## 2020-07-06 DIAGNOSIS — E785 Hyperlipidemia, unspecified: Secondary | ICD-10-CM | POA: Diagnosis present

## 2020-07-06 DIAGNOSIS — F32A Depression, unspecified: Secondary | ICD-10-CM | POA: Diagnosis present

## 2020-07-06 DIAGNOSIS — M5416 Radiculopathy, lumbar region: Secondary | ICD-10-CM | POA: Diagnosis present

## 2020-07-06 DIAGNOSIS — Z20822 Contact with and (suspected) exposure to covid-19: Secondary | ICD-10-CM | POA: Diagnosis present

## 2020-07-06 DIAGNOSIS — M48061 Spinal stenosis, lumbar region without neurogenic claudication: Secondary | ICD-10-CM | POA: Diagnosis present

## 2020-07-06 DIAGNOSIS — E039 Hypothyroidism, unspecified: Secondary | ICD-10-CM | POA: Diagnosis present

## 2020-07-06 DIAGNOSIS — F419 Anxiety disorder, unspecified: Secondary | ICD-10-CM | POA: Diagnosis present

## 2020-07-06 DIAGNOSIS — M4317 Spondylolisthesis, lumbosacral region: Secondary | ICD-10-CM | POA: Diagnosis present

## 2020-07-06 DIAGNOSIS — Z981 Arthrodesis status: Secondary | ICD-10-CM

## 2020-07-06 DIAGNOSIS — M5116 Intervertebral disc disorders with radiculopathy, lumbar region: Secondary | ICD-10-CM | POA: Diagnosis present

## 2020-07-06 DIAGNOSIS — Z419 Encounter for procedure for purposes other than remedying health state, unspecified: Secondary | ICD-10-CM

## 2020-07-06 HISTORY — PX: MAXIMUM ACCESS (MAS) TRANSFORAMINAL LUMBAR INTERBODY FUSION (TLIF) 2 LEVEL: SHX6393

## 2020-07-06 LAB — CBC
HCT: 36.8 % (ref 36.0–46.0)
Hemoglobin: 12.3 g/dL (ref 12.0–15.0)
MCH: 31.3 pg (ref 26.0–34.0)
MCHC: 33.4 g/dL (ref 30.0–36.0)
MCV: 93.6 fL (ref 80.0–100.0)
Platelets: 223 10*3/uL (ref 150–400)
RBC: 3.93 MIL/uL (ref 3.87–5.11)
RDW: 13.5 % (ref 11.5–15.5)
WBC: 5.3 10*3/uL (ref 4.0–10.5)
nRBC: 0 % (ref 0.0–0.2)

## 2020-07-06 LAB — TYPE AND SCREEN
ABO/RH(D): O POS
Antibody Screen: NEGATIVE

## 2020-07-06 SURGERY — MAXIMUM ACCESS (MAS) TRANSFORAMINAL LUMBAR INTERBODY FUSION (TLIF) 2 LEVEL
Anesthesia: General | Laterality: Right

## 2020-07-06 MED ORDER — MONTELUKAST SODIUM 10 MG PO TABS
10.0000 mg | ORAL_TABLET | Freq: Every day | ORAL | Status: DC
  Administered 2020-07-06 – 2020-07-08 (×3): 10 mg via ORAL
  Filled 2020-07-06 (×3): qty 1

## 2020-07-06 MED ORDER — ACETAMINOPHEN 10 MG/ML IV SOLN: 1000.0000 mg | Freq: Once | INTRAVENOUS | Status: DC | PRN

## 2020-07-06 MED ORDER — SUCCINYLCHOLINE CHLORIDE 20 MG/ML IJ SOLN
INTRAMUSCULAR | Status: DC | PRN
  Administered 2020-07-06: 80 mg via INTRAVENOUS

## 2020-07-06 MED ORDER — MELATONIN 5 MG PO TABS
10.0000 mg | ORAL_TABLET | Freq: Every day | ORAL | Status: DC
Start: 2020-07-06 — End: 2020-07-09
  Administered 2020-07-06 – 2020-07-08 (×3): 10 mg via ORAL
  Filled 2020-07-06 (×3): qty 2

## 2020-07-06 MED ORDER — BUPIVACAINE HCL (PF) 0.5 % IJ SOLN
INTRAMUSCULAR | Status: DC | PRN
  Administered 2020-07-06: 20 mL

## 2020-07-06 MED ORDER — MIDAZOLAM HCL 2 MG/2ML IJ SOLN
INTRAMUSCULAR | Status: DC | PRN
  Administered 2020-07-06: 2 mg via INTRAVENOUS

## 2020-07-06 MED ORDER — LEVOTHYROXINE SODIUM 50 MCG PO TABS
50.0000 ug | ORAL_TABLET | ORAL | Status: DC
  Administered 2020-07-07 – 2020-07-09 (×2): 50 ug via ORAL
  Filled 2020-07-06 (×2): qty 1

## 2020-07-06 MED ORDER — OXYCODONE HCL 5 MG PO TABS
ORAL_TABLET | ORAL | Status: AC
  Filled 2020-07-06: qty 1

## 2020-07-06 MED ORDER — METHOCARBAMOL 500 MG PO TABS
500.0000 mg | ORAL_TABLET | Freq: Four times a day (QID) | ORAL | Status: DC | PRN
  Administered 2020-07-07 – 2020-07-09 (×6): 500 mg via ORAL
  Filled 2020-07-06 (×6): qty 1

## 2020-07-06 MED ORDER — PROPOFOL 10 MG/ML IV BOLUS
INTRAVENOUS | Status: AC
  Filled 2020-07-06: qty 20

## 2020-07-06 MED ORDER — POLYETHYLENE GLYCOL 3350 17 GM/SCOOP PO POWD
17.0000 g | Freq: Every day | ORAL | Status: DC
  Filled 2020-07-06: qty 255

## 2020-07-06 MED ORDER — FENTANYL CITRATE (PF) 100 MCG/2ML IJ SOLN
INTRAMUSCULAR | Status: AC
  Administered 2020-07-06: 25 ug via INTRAVENOUS
  Filled 2020-07-06: qty 2

## 2020-07-06 MED ORDER — FLUMAZENIL 0.5 MG/5ML IV SOLN
INTRAVENOUS | Status: AC
  Filled 2020-07-06: qty 5

## 2020-07-06 MED ORDER — BUPIVACAINE-EPINEPHRINE (PF) 0.5% -1:200000 IJ SOLN
INTRAMUSCULAR | Status: DC | PRN
  Administered 2020-07-06: 10 mL

## 2020-07-06 MED ORDER — PROMETHAZINE HCL 25 MG/ML IJ SOLN
6.2500 mg | Freq: Once | INTRAMUSCULAR | Status: AC
  Administered 2020-07-06: 6.25 mg via INTRAVENOUS
  Filled 2020-07-06: qty 1

## 2020-07-06 MED ORDER — SODIUM CHLORIDE 0.9% FLUSH: 3.0000 mL | INTRAVENOUS | Status: DC | PRN

## 2020-07-06 MED ORDER — LEVOTHYROXINE SODIUM 75 MCG PO TABS
75.0000 ug | ORAL_TABLET | ORAL | Status: DC
  Administered 2020-07-08: 75 ug via ORAL
  Filled 2020-07-06: qty 1

## 2020-07-06 MED ORDER — CALTRATE MINIS PLUS MINERALS 300-800 MG-UNIT PO TABS: ORAL_TABLET | Freq: Every day | ORAL | Status: DC

## 2020-07-06 MED ORDER — FENTANYL CITRATE (PF) 100 MCG/2ML IJ SOLN
25.0000 ug | INTRAMUSCULAR | Status: DC | PRN
Start: 2020-07-06 — End: 2020-07-06
  Administered 2020-07-06 (×3): 25 ug via INTRAVENOUS

## 2020-07-06 MED ORDER — OXYCODONE HCL 5 MG/5ML PO SOLN: 5.0000 mg | Freq: Once | ORAL | Status: AC | PRN

## 2020-07-06 MED ORDER — CEFAZOLIN SODIUM-DEXTROSE 2-4 GM/100ML-% IV SOLN
2.0000 g | Freq: Three times a day (TID) | INTRAVENOUS | Status: AC
  Administered 2020-07-06 – 2020-07-07 (×2): 2 g via INTRAVENOUS
  Filled 2020-07-06 (×2): qty 100

## 2020-07-06 MED ORDER — ACETAMINOPHEN 325 MG PO TABS: 650.0000 mg | ORAL_TABLET | Freq: Four times a day (QID) | ORAL | Status: DC | PRN

## 2020-07-06 MED ORDER — ENOXAPARIN SODIUM 40 MG/0.4ML ~~LOC~~ SOLN
40.0000 mg | SUBCUTANEOUS | Status: DC
  Administered 2020-07-07 – 2020-07-09 (×3): 40 mg via SUBCUTANEOUS
  Filled 2020-07-06 (×3): qty 0.4

## 2020-07-06 MED ORDER — CEFAZOLIN SODIUM 1 G IJ SOLR
INTRAMUSCULAR | Status: AC
  Filled 2020-07-06: qty 20

## 2020-07-06 MED ORDER — OXYCODONE HCL 5 MG PO TABS
10.0000 mg | ORAL_TABLET | ORAL | Status: DC | PRN
  Administered 2020-07-06 – 2020-07-09 (×6): 10 mg via ORAL
  Filled 2020-07-06 (×6): qty 2

## 2020-07-06 MED ORDER — CARBOXYMETHYLCELLULOSE SODIUM 0.25 % OP SOLN: 1.0000 [drp] | Freq: Every day | OPHTHALMIC | Status: DC | PRN

## 2020-07-06 MED ORDER — OXYCODONE HCL 5 MG PO TABS
5.0000 mg | ORAL_TABLET | ORAL | Status: DC | PRN
  Administered 2020-07-07 – 2020-07-08 (×2): 5 mg via ORAL
  Filled 2020-07-06 (×2): qty 1

## 2020-07-06 MED ORDER — FLUOCINONIDE 0.05 % EX CREA
1.0000 "application " | TOPICAL_CREAM | Freq: Every day | CUTANEOUS | Status: DC | PRN
  Filled 2020-07-06: qty 30

## 2020-07-06 MED ORDER — CONJ ESTROG-MEDROXYPROGEST ACE 0.45-1.5 MG PO TABS
1.0000 | ORAL_TABLET | Freq: Every day | ORAL | Status: DC
  Administered 2020-07-08: 1 via ORAL

## 2020-07-06 MED ORDER — GABAPENTIN 100 MG PO CAPS
200.0000 mg | ORAL_CAPSULE | Freq: Three times a day (TID) | ORAL | Status: DC
  Administered 2020-07-06 – 2020-07-09 (×8): 200 mg via ORAL
  Filled 2020-07-06 (×8): qty 2

## 2020-07-06 MED ORDER — PHENOL 1.4 % MT LIQD
1.0000 | OROMUCOSAL | Status: DC | PRN
  Filled 2020-07-06: qty 177

## 2020-07-06 MED ORDER — OXYCODONE HCL 5 MG PO TABS
5.0000 mg | ORAL_TABLET | Freq: Once | ORAL | Status: AC | PRN
  Administered 2020-07-06: 5 mg via ORAL

## 2020-07-06 MED ORDER — PHENYLEPHRINE HCL (PRESSORS) 10 MG/ML IV SOLN
INTRAVENOUS | Status: DC | PRN
  Administered 2020-07-06: 150 ug via INTRAVENOUS
  Administered 2020-07-06 (×4): 100 ug via INTRAVENOUS

## 2020-07-06 MED ORDER — METHOCARBAMOL 1000 MG/10ML IJ SOLN
500.0000 mg | Freq: Four times a day (QID) | INTRAVENOUS | Status: DC | PRN
  Administered 2020-07-06: 500 mg via INTRAVENOUS
  Filled 2020-07-06: qty 5

## 2020-07-06 MED ORDER — ADULT MULTIVITAMIN W/MINERALS CH
1.0000 | ORAL_TABLET | Freq: Every day | ORAL | Status: DC
  Administered 2020-07-07 – 2020-07-09 (×3): 1 via ORAL
  Filled 2020-07-06 (×6): qty 1

## 2020-07-06 MED ORDER — CALCIUM CARBONATE-VITAMIN D 500-200 MG-UNIT PO TABS
1.0000 | ORAL_TABLET | Freq: Every day | ORAL | Status: DC
  Administered 2020-07-07 – 2020-07-09 (×3): 1 via ORAL
  Filled 2020-07-06 (×3): qty 1

## 2020-07-06 MED ORDER — ONDANSETRON HCL 4 MG/2ML IJ SOLN
4.0000 mg | Freq: Four times a day (QID) | INTRAMUSCULAR | Status: DC | PRN
  Administered 2020-07-07 (×2): 4 mg via INTRAVENOUS
  Filled 2020-07-06 (×2): qty 2

## 2020-07-06 MED ORDER — PSYLLIUM 95 % PO PACK
1.0000 | PACK | Freq: Every day | ORAL | Status: DC
  Administered 2020-07-08: 1 via ORAL
  Filled 2020-07-06 (×4): qty 1

## 2020-07-06 MED ORDER — MIDAZOLAM HCL 2 MG/2ML IJ SOLN
INTRAMUSCULAR | Status: AC
  Filled 2020-07-06: qty 2

## 2020-07-06 MED ORDER — ALBUTEROL SULFATE HFA 108 (90 BASE) MCG/ACT IN AERS
2.0000 | INHALATION_SPRAY | RESPIRATORY_TRACT | Status: DC | PRN
  Filled 2020-07-06: qty 6.7

## 2020-07-06 MED ORDER — CEFAZOLIN SODIUM-DEXTROSE 2-4 GM/100ML-% IV SOLN
INTRAVENOUS | Status: AC
  Filled 2020-07-06: qty 100

## 2020-07-06 MED ORDER — ONDANSETRON HCL 4 MG/2ML IJ SOLN
INTRAMUSCULAR | Status: AC
  Filled 2020-07-06: qty 2

## 2020-07-06 MED ORDER — PANTOPRAZOLE SODIUM 40 MG PO TBEC
40.0000 mg | DELAYED_RELEASE_TABLET | ORAL | Status: DC
  Administered 2020-07-07 – 2020-07-09 (×3): 40 mg via ORAL
  Filled 2020-07-06 (×3): qty 1

## 2020-07-06 MED ORDER — VANCOMYCIN HCL 1000 MG IV SOLR
INTRAVENOUS | Status: DC | PRN
  Administered 2020-07-06: 1000 mg via TOPICAL

## 2020-07-06 MED ORDER — DOCUSATE SODIUM 100 MG PO CAPS
100.0000 mg | ORAL_CAPSULE | Freq: Two times a day (BID) | ORAL | Status: DC
  Administered 2020-07-06 – 2020-07-09 (×6): 100 mg via ORAL
  Filled 2020-07-06 (×6): qty 1

## 2020-07-06 MED ORDER — BUPIVACAINE LIPOSOME 1.3 % IJ SUSP
INTRAMUSCULAR | Status: DC | PRN
  Administered 2020-07-06: 20 mL

## 2020-07-06 MED ORDER — LIDOCAINE HCL (CARDIAC) PF 100 MG/5ML IV SOSY
PREFILLED_SYRINGE | INTRAVENOUS | Status: DC | PRN
  Administered 2020-07-06: 60 mg via INTRAVENOUS

## 2020-07-06 MED ORDER — IVERMECTIN 1 % EX CREA
1.0000 "application " | TOPICAL_CREAM | Freq: Every day | CUTANEOUS | Status: DC
  Administered 2020-07-08: 1 via TOPICAL

## 2020-07-06 MED ORDER — BUPROPION HCL ER (SR) 100 MG PO TB12
200.0000 mg | ORAL_TABLET | Freq: Two times a day (BID) | ORAL | Status: DC
  Administered 2020-07-06 – 2020-07-09 (×6): 200 mg via ORAL
  Filled 2020-07-06 (×7): qty 2

## 2020-07-06 MED ORDER — ACETAMINOPHEN ER 650 MG PO TBCR: 1300.0000 mg | EXTENDED_RELEASE_TABLET | Freq: Every day | ORAL | Status: DC

## 2020-07-06 MED ORDER — ACETAMINOPHEN 500 MG PO TABS
1000.0000 mg | ORAL_TABLET | Freq: Every day | ORAL | Status: DC
  Administered 2020-07-06 – 2020-07-08 (×3): 1000 mg via ORAL
  Filled 2020-07-06 (×3): qty 2

## 2020-07-06 MED ORDER — SODIUM CHLORIDE 0.9 % IV SOLN: INTRAVENOUS | Status: DC

## 2020-07-06 MED ORDER — SALINE SPRAY 0.65 % NA SOLN
1.0000 | Freq: Two times a day (BID) | NASAL | Status: DC
  Administered 2020-07-07 – 2020-07-08 (×4): 1 via NASAL
  Filled 2020-07-06 (×2): qty 44

## 2020-07-06 MED ORDER — SODIUM CHLORIDE 0.9 % IV SOLN: 250.0000 mL | INTRAVENOUS | Status: DC

## 2020-07-06 MED ORDER — FLUTICASONE PROPIONATE 50 MCG/ACT NA SUSP
1.0000 | NASAL | Status: DC
  Administered 2020-07-07 – 2020-07-09 (×3): 1 via NASAL
  Filled 2020-07-06: qty 16

## 2020-07-06 MED ORDER — FLUMAZENIL 0.5 MG/5ML IV SOLN
0.2000 mg | Freq: Once | INTRAVENOUS | Status: AC
  Administered 2020-07-06: 0.2 mg via INTRAVENOUS

## 2020-07-06 MED ORDER — CHLORHEXIDINE GLUCONATE 0.12 % MT SOLN
OROMUCOSAL | Status: AC
  Administered 2020-07-06: 15 mL via OROMUCOSAL
  Filled 2020-07-06: qty 15

## 2020-07-06 MED ORDER — SACCHAROMYCES BOULARDII 250 MG PO CAPS
500.0000 mg | ORAL_CAPSULE | Freq: Every day | ORAL | Status: DC
  Administered 2020-07-07 – 2020-07-09 (×3): 500 mg via ORAL
  Filled 2020-07-06 (×3): qty 2

## 2020-07-06 MED ORDER — LEVOTHYROXINE SODIUM 50 MCG PO TABS: 50.0000 ug | ORAL_TABLET | ORAL | Status: DC

## 2020-07-06 MED ORDER — POLYVINYL ALCOHOL 1.4 % OP SOLN
1.0000 [drp] | OPHTHALMIC | Status: DC | PRN
  Administered 2020-07-08: 1 [drp] via OPHTHALMIC
  Filled 2020-07-06: qty 15

## 2020-07-06 MED ORDER — THROMBIN 5000 UNITS EX SOLR
CUTANEOUS | Status: DC | PRN
  Administered 2020-07-06: 5000 [IU] via TOPICAL

## 2020-07-06 MED ORDER — SODIUM CHLORIDE 0.9 % IV SOLN
INTRAVENOUS | Status: DC | PRN
  Administered 2020-07-06: 30 ug/min via INTRAVENOUS

## 2020-07-06 MED ORDER — SODIUM CHLORIDE 0.9% FLUSH
3.0000 mL | Freq: Two times a day (BID) | INTRAVENOUS | Status: DC
  Administered 2020-07-07 – 2020-07-08 (×3): 3 mL via INTRAVENOUS

## 2020-07-06 MED ORDER — METHYLCELLULOSE (LAXATIVE) PO POWD: 1.0000 | Freq: Every day | ORAL | Status: DC

## 2020-07-06 MED ORDER — FENTANYL CITRATE (PF) 100 MCG/2ML IJ SOLN
INTRAMUSCULAR | Status: AC
  Administered 2020-07-06: 50 ug via INTRAVENOUS
  Filled 2020-07-06: qty 2

## 2020-07-06 MED ORDER — ONDANSETRON HCL 4 MG PO TABS
4.0000 mg | ORAL_TABLET | Freq: Four times a day (QID) | ORAL | Status: DC | PRN
  Administered 2020-07-07: 4 mg via ORAL
  Filled 2020-07-06: qty 1

## 2020-07-06 MED ORDER — BUSPIRONE HCL 15 MG PO TABS
15.0000 mg | ORAL_TABLET | Freq: Two times a day (BID) | ORAL | Status: DC
  Administered 2020-07-06 – 2020-07-09 (×6): 15 mg via ORAL
  Filled 2020-07-06 (×6): qty 2

## 2020-07-06 MED ORDER — TRAZODONE HCL 50 MG PO TABS
50.0000 mg | ORAL_TABLET | Freq: Every day | ORAL | Status: DC
  Administered 2020-07-06 – 2020-07-08 (×3): 50 mg via ORAL
  Filled 2020-07-06 (×3): qty 1

## 2020-07-06 MED ORDER — PHILLIPS COLON HEALTH PO CAPS: 1.0000 | ORAL_CAPSULE | Freq: Every day | ORAL | Status: DC

## 2020-07-06 MED ORDER — PRAVASTATIN SODIUM 40 MG PO TABS
80.0000 mg | ORAL_TABLET | Freq: Every day | ORAL | Status: DC
  Administered 2020-07-07 – 2020-07-08 (×2): 80 mg via ORAL
  Filled 2020-07-06 (×2): qty 4

## 2020-07-06 MED ORDER — ACETAMINOPHEN 650 MG RE SUPP: 650.0000 mg | RECTAL | Status: DC | PRN

## 2020-07-06 MED ORDER — POLYETHYL GLYCOL-PROPYL GLYCOL 0.4-0.3 % OP GEL: 1.0000 "application " | Freq: Every day | OPHTHALMIC | Status: DC

## 2020-07-06 MED ORDER — MECLIZINE HCL 25 MG PO TABS
25.0000 mg | ORAL_TABLET | Freq: Every day | ORAL | Status: DC | PRN
  Administered 2020-07-08: 25 mg via ORAL
  Filled 2020-07-06 (×2): qty 1

## 2020-07-06 MED ORDER — DICLOFENAC SODIUM 1 % EX GEL
1.0000 "application " | Freq: Two times a day (BID) | CUTANEOUS | Status: DC
  Administered 2020-07-06 – 2020-07-08 (×5): 1 via TOPICAL
  Filled 2020-07-06: qty 100

## 2020-07-06 MED ORDER — CEFAZOLIN SODIUM-DEXTROSE 2-4 GM/100ML-% IV SOLN
2.0000 g | Freq: Three times a day (TID) | INTRAVENOUS | Status: DC
  Filled 2020-07-06 (×2): qty 100

## 2020-07-06 MED ORDER — LIOTHYRONINE SODIUM 5 MCG PO TABS
2.5000 ug | ORAL_TABLET | Freq: Every day | ORAL | Status: DC
  Administered 2020-07-07 – 2020-07-09 (×3): 2.5 ug via ORAL
  Filled 2020-07-06 (×3): qty 1

## 2020-07-06 MED ORDER — PROPOFOL 10 MG/ML IV BOLUS
INTRAVENOUS | Status: DC | PRN
  Administered 2020-07-06: 100 mg via INTRAVENOUS

## 2020-07-06 MED ORDER — MENTHOL 3 MG MT LOZG
1.0000 | LOZENGE | OROMUCOSAL | Status: DC | PRN
Start: 2020-07-06 — End: 2020-07-09
  Filled 2020-07-06: qty 9

## 2020-07-06 MED ORDER — MOMETASONE FURO-FORMOTEROL FUM 100-5 MCG/ACT IN AERO
2.0000 | INHALATION_SPRAY | Freq: Two times a day (BID) | RESPIRATORY_TRACT | Status: DC
  Administered 2020-07-06 – 2020-07-09 (×6): 2 via RESPIRATORY_TRACT
  Filled 2020-07-06: qty 8.8

## 2020-07-06 MED ORDER — CEFAZOLIN SODIUM-DEXTROSE 2-4 GM/100ML-% IV SOLN
2.0000 g | Freq: Once | INTRAVENOUS | Status: AC
  Administered 2020-07-06 (×2): 2 g via INTRAVENOUS

## 2020-07-06 MED ORDER — DARIFENACIN HYDROBROMIDE ER 15 MG PO TB24
15.0000 mg | ORAL_TABLET | Freq: Every day | ORAL | Status: DC
  Administered 2020-07-08: 15 mg via ORAL
  Filled 2020-07-06 (×5): qty 1

## 2020-07-06 MED ORDER — FENTANYL CITRATE (PF) 250 MCG/5ML IJ SOLN
INTRAMUSCULAR | Status: AC
  Filled 2020-07-06: qty 5

## 2020-07-06 MED ORDER — FENTANYL CITRATE (PF) 100 MCG/2ML IJ SOLN
INTRAMUSCULAR | Status: DC | PRN
  Administered 2020-07-06 (×4): 50 ug via INTRAVENOUS

## 2020-07-06 MED ORDER — ONDANSETRON HCL 4 MG/2ML IJ SOLN
4.0000 mg | Freq: Once | INTRAMUSCULAR | Status: AC | PRN
  Administered 2020-07-06: 4 mg via INTRAVENOUS

## 2020-07-06 MED ORDER — DEXAMETHASONE SODIUM PHOSPHATE 10 MG/ML IJ SOLN
INTRAMUSCULAR | Status: DC | PRN
  Administered 2020-07-06: 5 mg via INTRAVENOUS

## 2020-07-06 MED ORDER — MORPHINE SULFATE (PF) 2 MG/ML IV SOLN
2.0000 mg | INTRAVENOUS | Status: DC | PRN
  Administered 2020-07-06 – 2020-07-07 (×2): 2 mg via INTRAVENOUS
  Filled 2020-07-06 (×2): qty 1

## 2020-07-06 SURGICAL SUPPLY — 93 items
BLADE BOVIE TIP EXT 4 (BLADE) ×2 IMPLANT
BONE CANC CHIPS 20CC PCAN1/4 (Bone Implant) ×2 IMPLANT
BONE MATRIX OSTEOCEL PRO SM (Bone Implant) ×2 IMPLANT
BULB RESERV EVAC DRAIN JP 100C (MISCELLANEOUS) ×2 IMPLANT
BUR NEURO DRILL SOFT 3.0X3.8M (BURR) ×2 IMPLANT
CANISTER SUCT 1200ML W/VALVE (MISCELLANEOUS) IMPLANT
CHIPS CANC BONE 20CC PCAN1/4 (Bone Implant) ×1 IMPLANT
CHLORAPREP W/TINT 26 (MISCELLANEOUS) ×2 IMPLANT
CNTNR SPEC 2.5X3XGRAD LEK (MISCELLANEOUS) ×1
CONT SPEC 4OZ STER OR WHT (MISCELLANEOUS) ×1
CONTAINER SPEC 2.5X3XGRAD LEK (MISCELLANEOUS) ×1 IMPLANT
COUNTER NEEDLE 20/40 LG (NEEDLE) ×2 IMPLANT
COVER BACK TABLE REUSABLE LG (DRAPES) ×2 IMPLANT
COVER LIGHT HANDLE STERIS (MISCELLANEOUS) ×4 IMPLANT
COVER WAND RF STERILE (DRAPES) ×2 IMPLANT
CUP MEDICINE 2OZ PLAST GRAD ST (MISCELLANEOUS) ×2 IMPLANT
DERMABOND ADVANCED (GAUZE/BANDAGES/DRESSINGS)
DERMABOND ADVANCED .7 DNX12 (GAUZE/BANDAGES/DRESSINGS) IMPLANT
DRAIN CHANNEL JP 10F RND 20C F (MISCELLANEOUS) ×2 IMPLANT
DRAPE C-ARM 42X70 (DRAPES) ×4 IMPLANT
DRAPE C-ARM XRAY 36X54 (DRAPES) ×2 IMPLANT
DRAPE LAPAROTOMY 100X77 ABD (DRAPES) ×2 IMPLANT
DRAPE MICROSCOPE SPINE 48X150 (DRAPES) ×2 IMPLANT
DRAPE SURG 17X11 SM STRL (DRAPES) ×8 IMPLANT
DRSG TEGADERM 4X10 (GAUZE/BANDAGES/DRESSINGS) ×2 IMPLANT
DRSG TEGADERM 4X4.75 (GAUZE/BANDAGES/DRESSINGS) ×2 IMPLANT
DURASEAL APPLICATOR TIP (TIP) IMPLANT
DURASEAL SPINE SEALANT 3ML (MISCELLANEOUS) IMPLANT
ELECT CAUTERY BLADE TIP 2.5 (TIP) ×2
ELECT EZSTD 165MM 6.5IN (MISCELLANEOUS) ×2
ELECT REM PT RETURN 9FT ADLT (ELECTROSURGICAL) ×2
ELECTRODE CAUTERY BLDE TIP 2.5 (TIP) ×1 IMPLANT
ELECTRODE EZSTD 165MM 6.5IN (MISCELLANEOUS) ×1 IMPLANT
ELECTRODE REM PT RTRN 9FT ADLT (ELECTROSURGICAL) ×1 IMPLANT
GAUZE 4X4 16PLY RFD (DISPOSABLE) ×2 IMPLANT
GAUZE SPONGE 4X4 12PLY STRL (GAUZE/BANDAGES/DRESSINGS) ×4 IMPLANT
GAUZE XEROFORM 1X8 LF (GAUZE/BANDAGES/DRESSINGS) ×2 IMPLANT
GLOVE SRG 8 PF TXTR STRL LF DI (GLOVE) ×1 IMPLANT
GLOVE SURG SYN 7.0 (GLOVE) ×4 IMPLANT
GLOVE SURG SYN 8.0 (GLOVE) ×4 IMPLANT
GLOVE SURG UNDER POLY LF SZ7 (GLOVE) IMPLANT
GLOVE SURG UNDER POLY LF SZ8 (GLOVE) ×1
GOWN STRL REUS W/ TWL LRG LVL3 (GOWN DISPOSABLE) ×1 IMPLANT
GOWN STRL REUS W/ TWL XL LVL3 (GOWN DISPOSABLE) ×2 IMPLANT
GOWN STRL REUS W/TWL LRG LVL3 (GOWN DISPOSABLE) ×1
GOWN STRL REUS W/TWL XL LVL3 (GOWN DISPOSABLE) ×2
GRADUATE 1200CC STRL 31836 (MISCELLANEOUS) ×2 IMPLANT
GUIDEWIRE NITINOL BEVEL TIP (WIRE) ×12 IMPLANT
IMPL TLX20 8X11X26 20D (Cage) ×2 IMPLANT
IV CATH ANGIO 12GX3 LT BLUE (NEEDLE) ×2 IMPLANT
KIT ACCESS MAXCESS MAS TLIF 2 (KITS) ×2 IMPLANT
KIT DILATOR XLIF 5 (KITS) ×1 IMPLANT
KIT NEEDLE NVM5 EMG ELECT (KITS) ×1 IMPLANT
KIT NEEDLE NVM5 EMG ELECTRODE (KITS) ×1
KIT SPINAL PRONEVIEW (KITS) ×2 IMPLANT
KIT TURNOVER KIT A (KITS) ×2 IMPLANT
KIT XLIF (KITS) ×1
MANIFOLD NEPTUNE II (INSTRUMENTS) ×2 IMPLANT
MARKER SKIN DUAL TIP RULER LAB (MISCELLANEOUS) ×4 IMPLANT
NDL SAFETY ECLIPSE 18X1.5 (NEEDLE) ×1 IMPLANT
NEEDLE HYPO 18GX1.5 SHARP (NEEDLE) ×1
NEEDLE HYPO 22GX1.5 SAFETY (NEEDLE) ×2 IMPLANT
NEEDLE I PASS (NEEDLE) ×2 IMPLANT
NS IRRIG 1000ML POUR BTL (IV SOLUTION) ×2 IMPLANT
PACK LAMINECTOMY NEURO (CUSTOM PROCEDURE TRAY) ×2 IMPLANT
PAD ARMBOARD 7.5X6 YLW CONV (MISCELLANEOUS) ×2 IMPLANT
PUTTY DBM PROPEL MEDIUM (Putty) ×2 IMPLANT
REDUCTION EXT RELINE MAS MOD (Neuro Prosthesis/Implant) ×4 IMPLANT
ROD RELINE MAS LORDOTIC 5.5X60 (Rod) ×4 IMPLANT
SCREW LOCK RELINE CADDY 5.5MM (Screw) ×12 IMPLANT
SCREW RELINE MAS POLY 6.5X40MM (Screw) ×2 IMPLANT
SCREW RELINE RED 6.5X45MM POLY (Screw) ×6 IMPLANT
SCREW SHANK MAS MOD 6.5X40MM (Screw) ×2 IMPLANT
SCREW SHANK RELINE 6.5X45MM 2C (Screw) ×2 IMPLANT
SPOGE SURGIFLO 8M (HEMOSTASIS) ×1
SPONGE DRAIN TRACH 4X4 STRL 2S (GAUZE/BANDAGES/DRESSINGS) ×2 IMPLANT
SPONGE KITTNER 5P (MISCELLANEOUS) IMPLANT
SPONGE SURGIFLO 8M (HEMOSTASIS) ×1 IMPLANT
STAPLER SKIN PROX 35W (STAPLE) ×2 IMPLANT
SUT ETHILON 3-0 FS-10 30 BLK (SUTURE) ×4
SUT NURALON 4 0 TR CR/8 (SUTURE) IMPLANT
SUT POLYSORB 2-0 5X18 GS-10 (SUTURE) ×6 IMPLANT
SUT VIC AB 0 CT1 18XCR BRD 8 (SUTURE) ×1 IMPLANT
SUT VIC AB 0 CT1 8-18 (SUTURE) ×1
SUT VIC AB 3-0 SH 8-18 (SUTURE) IMPLANT
SUTURE EHLN 3-0 FS-10 30 BLK (SUTURE) ×2 IMPLANT
SYR 10ML LL (SYRINGE) ×4 IMPLANT
SYR 20ML LL LF (SYRINGE) ×2 IMPLANT
SYR 30ML LL (SYRINGE) ×4 IMPLANT
TLX20 IMPLANT 8X11X26 20D (Cage) ×4 IMPLANT
TOWEL OR 17X26 4PK STRL BLUE (TOWEL DISPOSABLE) ×4 IMPLANT
TRAY FOLEY MTR SLVR 16FR STAT (SET/KITS/TRAYS/PACK) ×2 IMPLANT
TUBING CONNECTING 10 (TUBING) ×2 IMPLANT

## 2020-07-06 NOTE — H&P (Signed)
Andrea Noble is an 70 y.o. female.   Chief Complaint: Back and right leg pain HPI: Andrea Noble is here for evaluation of ongoing pain in the back and right leg. She says that the back pain has been there for some time and she does have some previous left leg pain but currently the right leg pain started 3 to 6 months ago. She does not remember any inciting event. She does feel that the pain will go down the lateral side of the leg into the calf and sometimes into the foot. She sometimes will get tingling in the foot. She is not experiencing this in the left leg currently. The leg pain is more bothersome than the back pain to her, however they both of limit how much activity she can do including walking. She has been taking gabapentin at nighttime occasionally. She does take NSAIDs. She recently underwent an injection and she felt like she did not get any significant relief from this but also that it may have worsened some bowel and bladder issues that she has been dealing with over the past year. She does state that she has more urinary urgency and will sometimes have stool with urination. She has not had this evaluated previously. She does have an MRI of the lumbar spine showing stenosis on right at L4/5 and L5/S1    Past Medical History:  Diagnosis Date  . Anxiety   . Arthritis   . Asthma   . Cancer ([place])    hodgkins lymphoma  . Cataract   . Depression   . Diverticulitis   . GERD (gastroesophageal reflux disease)   . Hyperlipidemia   . Hypothyroidism   . Pneumonia   . Seasonal allergies   . SVT (supraventricular tachycardia) ([place])   . Thyroid disease    hypothyroid    Past Surgical History:  Procedure Laterality Date  . BLEPHAROPLASTY Bilateral   . BREAST BIOPSY     benign  . BREAST BIOPSY  07/27/2018   [place] core bx venus COLUMNAR CELL METAPLASIA, AND PSEUDOANGIOMATOUS STROMAL  . CARPAL TUNNEL RELEASE Right   . CARPAL TUNNEL RELEASE Left   . CATARACT EXTRACTION Bilateral 2015   . CLEFT LIP REPAIR    . COLONOSCOPY WITH PROPOFOL N/A 07/09/2018   Procedure: COLONOSCOPY WITH PROPOFOL;  Surgeon: Lollie Sails, MD;  Location: [facility] ENDOSCOPY;  Service: Endoscopy;  Laterality: N/A;  . DE QUERVAIN'S RELEASE Bilateral   . DIAGNOSTIC LAPAROSCOPY     endometriosis  . ESOPHAGOGASTRODUODENOSCOPY N/A 07/09/2018   Procedure: ESOPHAGOGASTRODUODENOSCOPY (EGD);  Surgeon: Lollie Sails, MD;  Location[facility] ENDOSCOPY;  Service: Endoscopy;  Laterality: N/A;  . FINGER SURGERY Right    Middle finger joint replaced  . THUMB SURGERY Right    took tendon from forearm and wrapped it around the thumb joint    Family History  Problem Relation Age of Onset  . Breast cancer Maternal Aunt   . Anuerysm Mother   . Heart attack Father   . Alzheimer's disease Father    Social History:  reports that she has never smoked. She has never used smokeless tobacco. She reports that she does not drink alcohol and does not use drugs.  Allergies:  Allergies  Allergen Reactions  . Levofloxacin     IV Levaquin causes burning  . Other Itching    Surgical glue    No medications prior to admission.    No results found for this or any previous visit (from the past 48 hour(s)).  No results found.  Review of Systems General ROS: Negative Psychological ROS: Negative Ophthalmic ROS: Negative ENT ROS: Negative Hematological and Lymphatic ROS: Negative  Endocrine ROS: Negative Respiratory ROS: Negative Cardiovascular ROS: Negative Gastrointestinal ROS: Negative Genito-Urinary ROS: Negative Musculoskeletal ROS: Positive for back pain Neurological ROS: Positive for right leg pain, numbness Dermatological ROS: Negative   There were no vitals taken for this visit. Physical Exam  General appearance: Alert, cooperative, in no acute distress Head: Normocephalic, atraumatic Eyes: Normal, EOM intact Oropharynx: Wearing facemask CVL Regular rate and rhythm Pulm: Clear to auscultation Back:  Some tenderness to palpation of the midline and paramedian regions Ext: No edema in LE bilaterally, warm extremities  Neurologic exam:  Mental status: alertness: alert, affect: normal Speech: fluent and clear Motor:strength symmetric 5/5 in bilateral hip flexion, knee flexion, knee extension with dorsi, plantar flexion Sensory: intact to light touch in bilateral lower extremities Reflexes: 2+ and symmetric bilaterally for patella Gait: normal    MRI lumbar spine: There is a normal lordotic curvature. There is severe degenerative disease noted throughout the mid lumbar spine. There is a grade 1 retrolisthesis of L3 on L4. There is a grade 1 anterior listhesis of L4 on L5. At L4-5, there is facet hypertrophy causing moderate to severe lateral recess stenosis bilaterally. At L5-S1 there is mild lateral recess stenosis on the right.   Assessment/Plan Proceed with L4-S1 decompression and fusion  Andrea Perla, MD 07/06/2020, 10:48 AM

## 2020-07-06 NOTE — Op Note (Signed)
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)   POST-OP DIAGNOSIS: Post-Op Diagnosis Codes:  Lumbar Stenosis with Lumbar Radiculopathy (m48.062)   Procedure(s) with comments: Right L4/5Transforaminal Interbody Fusion, Facetectomy, Laminectomy with Discectomy  L4-S1 Pedicle Screw Fixation with Rod Placement  Autograft and Allograft Placement  ARTHRODESIS, COMBINED POSTERIOR OR POSTEROLATERAL TECHNIQUE W/POSTERIOR INTERBODY TECHNIQUE INCL LAMINECTOMY AND/OR DISCECTOMY SUFFICIENT TO PREPARE INTERSPACE, SINGLE INTERSPACE AND SEGMENT; LUMBAR---L4/5TLIF WITH LAMINECTOMIES  POSTERIOR NONSEGMENTAL INSTRUMENT, TECHNI, PEDICLE FIX 1 INTERSPACE, ATANTOAXIAL TRANSARTIC SCREW FIX, SUBLAMINAR WIRING C1, FACET SCREW FIX) (LIST IN ADDITION CODE FOR PRIMARY PROCEDURE)  INSERTION INTERBODY BIOMECHANICAL DEVICE WITH INTEGRAL ANTERIOR INSTRUMENTATION, TO INTERVERTEBRAL DISC SPACE IN CONJUNCTION INTERBODY ARTHRODESIS, EACH INTERSPACE (LIST CODE FOR PRIMARY PROCEDURE)  ALLOGRAFT, MORSELIZED, OR PLACEMENT OF OSTEOPROMOTIVE MATERIAL, FOR SPINE SURGERY ONLY (LIST IN ADDITION TO PRIMARY PROCEDURE)  AUTOGRAFT FOR SPINE SURGERY ONLY (INCL HARVESTING GRAFT); LOCAL (EG, RIBS, SPINOUS PROCESS, OR LAMINAR FRAGMENT) OBTAINED FROM SAME INCISION (LIST IN ADDITION TO PRIMARY PROCEDURE)   SURGEON:  * Malen Gauze, MD  Liliane Bade, PA  ANESTHESIA: General   OPERATIVE FINDINGS:Disc Herniation and facet hypertrophy atL4/5with stenosis and Spondylolisthesis. L5/S1 facet arthorpathy   OPERATIVE REPORT:  Indication:Ms Petersenpresented to the clinic on 1/4with ongoing right leg pain and back pain. She was found on imaging to have stenosisat L4-5 with a spondylolisthesis and facet hypertrophy and arthropathy at L5/S1on MRI. Given previously failed conservative management including OTC medications, precription medications, injections, and physical therapy, we  discussed need for decompression and fusion at L4-S1.  The risks of surgery were explained to include hematoma, infection, damage to nerve roots, CSF leak, weakness, numbness, pain, failure of fusion, need for future surgery, heart attack, and stroke. She elected to proceed with surgery for symptom relief.   Procedure  The patient was brought to the OR after informed consent was obtained. She was given general anesthesia and intubated by the anesthesia service. Vascular access lines and a foley catheter were placed. Neuromonitoring electrodes were placed for EMG in lower extremities. The patient was then placed prone on a Jackson table ensuring all pressure points were padded. A time-out was performed per protocol.   The patient was sterilely prepped and draped. The fluoroscopy was used to identify theL4 through S1 levels and the pedicles were marked. Incisions were planned in paramedian fashion using fluoroscopy to identify the pedicle line.Local anesthetic with epinephrine was instilled into the planned incisions. The skin was opened sharply and the dissection taken to the fascia. This was incised with cautery.   Using fluoroscopy, an entry point as placed at thepedicle at L4 and a Jamshidi was advanced using monitoring to a depth of 3 cm ensuring we were past the pedicle/body interface. There was no stimulation below 90mA on any screw. Once the Jamshidi was placed, a k-wire was placed through this and then a 5.5 mm tap used with monitoring to prepare for the screw. Once this was performed at the L4, L5, and S1 levels bilaterally, the screws were placed. On the left we placed 6.5 mm x 45 mm screws at L4 and S1 with a 6.5 mm x 40 mm screw at L5.  And on the right we placed 6.57mm x 38mm at L for an S1 and 6.54mm x 9mm at L5. We confirmed placement with fluoroscopy and then stimulated and all screws stimulated above 31mA.   The screws on the right had the retractor blades attached and the TLIF  adaptor was attached.Next, the lamina of  L4 was identified and the space distracted. The muscle was cleared from the facet and lamina. Next, the drill was used to remove the lamina and working to the inferior facet of L4. The ligament was identified beneath this. Next, The superior facet of  L5 was removed and then the ligament removed beneath this to expose the dura medially and the exiting L4 nerve root.    There was seen to be disc material noted beneath the dura.  This was removed.  There was significant overgrowth of the facet which was removed from the lateral portion of the dura.  Superiorly, there remains some very adherent tissue but it appeared decompressed. Medially, we worked underneath the dura in order to remove more disc material.    The disc space was identified and all ligament overlying the traversing nerve root removed. The disc space was coagulated and then entered sharply.  The dura was retracted medially and the L4 nerve root protected. The disc space was cleared with combination of shavers and curettes. Once clear, a 8-9mm expandable graft was placed within the disc space and placed in the midline anteriorly to promote arthrodesis. Fluoroscopy confirmed placement.Bone graft was then placed within the disc space and interbody graft.   The retractors were then removed and the left side screws advanced. Next, a60 mm rod was placed on left with 60 mm rod on the right and set screws were placed and torqued bilaterally.Fluoroscopy confirmed good placement of the rods and adequate length.  On the left, we decorticated the facets from L4-S1 using the drill.  We then placed bone graft along the hardware laterally to promote arthrodesis  The wounds wereirrigated with saline and hemostasis was achieved. A drain was exited through the skin on the right. The fascia was closed with 0 vicryl bilaterally.  Exparel was injected.  Vancomycin powder was placed.  Next, multiple subcutaneous and  dermal layers were closed with 2-0 vicryl until the epidermis was well approximated. The skin was closed with staples. The drain was secured with suture.   The patient was returned to supine position and extubated by the anesthesia service. The patient was then taken to the PACU for post-operative care where she was moving extremities symmetrically. I discussed the results with the family and all questions were answered.   ESTIMATED BLOOD LOSS: 250 cc  SPECIMENS  None   IMPLANT BONE MATRIX OSTEOCEL PRO SM - [id number]  Inventory Item: BONE MATRIX OSTEOCEL PRO SM Serial no.: [id number] Model/Cat no.: [id number]  Implant name: BONE MATRIX OSTEOCEL PRO SM - [id number] Laterality: Right Area: Spine Lumbar  Manufacturer: [employer] Date of Manufacture:    Action: Implanted Number Used: 1   Device Identifier:  Device Identifier Type:     PUTTY PROPEL MEDIUM - [id number]  Inventory Item: PUTTY PROPEL MEDIUM Serial no.:  Model/Cat no.: [id number]  Implant name: [place] Royalty MEDIUM - [id number] Laterality: Right Area: Spine Lumbar  Manufacturer: [employer] Date of Manufacture:    Action: Implanted Number Used: 1   Device Identifier:  Device Identifier Type:     TLX20 IMPLANT [id number] 20D - [id number]  Inventory Item: TLX20 IMPLANT [id number] 20D Serial no.:  Model/Cat no.: [id number]  Implant name: TLX20 IMPLANT [id number] 20D - [id number] Laterality: Right Area: Spine Lumbar  Manufacturer: [employer] Date of Manufacture:    Action: Implanted Number Used: 1   Device Identifier:  Device Identifier Type:     BONE CANC CHIPS 20CC - [id number]  Inventory Item: BONE CANC CHIPS  20CC Serial no.: [id number] Model/Cat no.: [id number]  Implant name: BONE [facility] CHIPS 20CC - [id number] Laterality: Right Area: Spine Lumbar  Manufacturer: [place] Date of Manufacture:    Action: Implanted Number Used: 1   Device Identifier:  Device Identifier Type:     SCREW SHANK MAS MOD 6.5X40MM -  [id number]  Inventory Item: SCREW SHANK MAS MOD 6.5X40MM Serial no.:  Model/Cat no.: [id number]  Implant name: Nydia Bouton MAS MOD 6.5X40MM - [id number] Laterality: Right Area: Spine Lumbar  Manufacturer: [employer] Date of Manufacture:    Action: Implanted Number Used: 1   Device Identifier:  Device Identifier Type:     SCREW SHANK RELINE 6.5X45MM 2C - [id number]  Inventory Item: SCREW SHANK RELINE 6.5X45MM 2C Serial no.:  Model/Cat no.: [id number]  Implant name: Nydia Bouton RELINE 6.5X45MM 2C - [id number] Laterality: Right Area: Spine Lumbar  Manufacturer: [employer] Date of Manufacture:    Action: Implanted Number Used: 1   Device Identifier:  Device Identifier Type:     SCREW LOCK RELINE CADDY 5.5MM - [id number]  Inventory Item: SCREW LOCK RELINE CADDY 5.5MM Serial no.:  Model/Cat no.: [id number]  Implant name: SCREW LOCK RELINE CADDY 5.5MM - [id number] Laterality: Right Area: Spine Lumbar  Manufacturer: [employer] Date of Manufacture:    Action: Implanted Number Used: 6   Device Identifier:  Device Identifier Type:     REDUCTION EXT RELINE MAS MOD - [id number]  Inventory Item: REDUCTION EXT RELINE MAS MOD Serial no.:  Model/Cat no.: [id number]  Implant name: REDUCTION EXT RELINE MAS MOD - [id number] Laterality: Right Area: Spine Lumbar  Manufacturer: [employer] Date of Manufacture:    Action: Implanted Number Used: 2   Device Identifier:  Device Identifier Type:     SCREW RELINE RED 6.5X45MM POLY - [id number]  Inventory Item: SCREW RELINE RED 6.5X45MM POLY Serial no.:  Model/Cat no.: [id number]  Implant name: SCREW RELINE RED 6.5X45MM POLY - [id number] Laterality: Right Area: Spine Lumbar  Manufacturer: [employer] Date of Manufacture:    Action: Implanted Number Used: 3   Device Identifier:  Device Identifier Type:     SCREW RELINE MAS POLY 6.5X40MM - [id number]  Inventory Item: SCREW RELINE MAS POLY 6.5X40MM Serial no.:  Model/Cat no.: [id number]  Implant name: SCREW RELINE MAS  POLY 6.5X40MM - [id number] Laterality: Right Area: Spine Lumbar  Manufacturer: [employer] Date of Manufacture:    Action: Implanted Number Used: 1   Device Identifier:  Device Identifier Type:     ROD RELINE MAS LORDOTIC 5.5X60 - [id number]  Inventory Item: ROD RELINE MAS LORDOTIC 5.5X60 Serial no.:  Model/Cat no.: [id number]  Implant name: ROD RELINE MAS LORDOTIC 5.5X60 - [id number] Laterality: Right Area: Spine Lumbar  Manufacturer: [employer] Date of Manufacture:    Action: Implanted Number Used: 2   Device Identifier:  Device Identifier Type:     TLX20 IMPLANT [id number] 20D - [id number]  Inventory Item: TLX20 IMPLANT [id number] 20D Serial no.:  Model/Cat no.: [id number]  Implant name: TLX20 IMPLANT [id number] 20D - [id number] Laterality: Right Area: Spine Lumbar  Manufacturer: [employer] Date of Manufacture:    Action: Implanted Number Used: 1   Device Identifier:  Device Identifier Type:       I performed the case in its entirety with assistance of PA, [facility]  Deetta Perla, [place]

## 2020-07-06 NOTE — Transfer of Care (Signed)
Immediate Anesthesia Transfer of Care Note  Patient: Andrea Noble  Procedure(s) Performed: L4/5, L5/S1 TRANSFORAMINAL LUMBAR INTERBODY FUSION (TLIF), L4-S1 PEDICLE SCREWS,DECOMPRESSION  (Right )  Patient Location: PACU  Anesthesia Type:General  Level of Consciousness: awake and alert   Airway & Oxygen Therapy: Patient Spontanous Breathing and Patient connected to nasal cannula oxygen  Post-op Assessment: Report given to RN and Post -op Vital signs reviewed and stable  Post vital signs: Reviewed and stable  Last Vitals:  Vitals Value Taken Time  BP 98/55 07/06/20 1715  Temp    Pulse 81 07/06/20 1718  Resp 14 07/06/20 1718  SpO2 100 % 07/06/20 1718  Vitals shown include unvalidated device data.  Last Pain:  Vitals:   07/06/20 1131  TempSrc: Tympanic  PainSc: 0-No pain         Complications: No complications documented.

## 2020-07-06 NOTE — Anesthesia Preprocedure Evaluation (Signed)
Anesthesia Evaluation  Patient identified by MRN, date of birth, ID band Patient awake    Reviewed: Allergy & Precautions, NPO status , Patient's Chart, lab work & pertinent test results  History of Anesthesia Complications Negative for: history of anesthetic complications  Airway Mallampati: II  TM Distance: >3 FB Neck ROM: Full    Dental no notable dental hx. (+) Teeth Intact   Pulmonary asthma , neg sleep apnea, neg COPD, Patient abstained from smoking.Not current smoker,  Active sinusitis. No systemic symptoms, but does have some drainage. Not on antibiotics, likely viral. Patient gets these occasionally depending on season. Never hospitalized for asthma. Took her regular and her albuterol inhalers this AM as precaution   Pulmonary exam normal breath sounds clear to auscultation       Cardiovascular Exercise Tolerance: Good METS(-) hypertension(-) CAD and (-) Past MI negative cardio ROS  (-) dysrhythmias  Rhythm:Regular Rate:Normal - Systolic murmurs    Neuro/Psych PSYCHIATRIC DISORDERS Anxiety Depression negative neurological ROS     GI/Hepatic GERD  Medicated,(+)     (-) substance abuse  ,   Endo/Other  neg diabetesHypothyroidism   Renal/GU negative Renal ROS     Musculoskeletal  (+) Arthritis , Osteoarthritis,    Abdominal   Peds  Hematology   Anesthesia Other Findings Past Medical History: No date: Anxiety No date: Arthritis No date: Asthma No date: Cancer ([place])     Comment:  hodgkins lymphoma No date: Cataract No date: Depression No date: Diverticulitis No date: GERD (gastroesophageal reflux disease) No date: Hyperlipidemia No date: Hypothyroidism No date: Pneumonia No date: Seasonal allergies No date: SVT (supraventricular tachycardia) ([facility]) No date: Thyroid disease     Comment:  hypothyroid  Reproductive/Obstetrics                            Anesthesia  Physical Anesthesia Plan  ASA: II  Anesthesia Plan: General   Post-op Pain Management:    Induction: Intravenous  PONV Risk Score and Plan: 3 and Ondansetron, Dexamethasone, Propofol infusion and Treatment may vary due to age or medical condition  Airway Management Planned: Oral ETT  Additional Equipment: None  Intra-op Plan:   Post-operative Plan: Extubation in OR  Informed Consent: I have reviewed the patients History and Physical, chart, labs and discussed the procedure including the risks, benefits and alternatives for the proposed anesthesia with the patient or authorized representative who has indicated his/her understanding and acceptance.     Dental advisory given  Plan Discussed with: CRNA and Surgeon  Anesthesia Plan Comments: (Discussed risks of anesthesia with patient, including PONV, sore throat, lip/dental damage. Rare risks discussed as well, such as cardiorespiratory and neurological sequelae. Patient understands.)        Anesthesia Quick Evaluation

## 2020-07-06 NOTE — Anesthesia Procedure Notes (Signed)
Procedure Name: Intubation Performed by: Fredderick Phenix, CRNA Pre-anesthesia Checklist: Patient identified, Emergency Drugs available, Suction available and Patient being monitored Patient Re-evaluated:Patient Re-evaluated prior to induction Oxygen Delivery Method: Circle system utilized Preoxygenation: Pre-oxygenation with 100% oxygen Induction Type: IV induction Ventilation: Mask ventilation without difficulty Laryngoscope Size: Mac and 4 Grade View: Grade I Tube type: Oral Tube size: 6.5 mm Number of attempts: 1 Airway Equipment and Method: Stylet and Oral airway Placement Confirmation: ETT inserted through vocal cords under direct vision,  positive ETCO2 and breath sounds checked- equal and bilateral Secured at: 21 cm Tube secured with: Tape Dental Injury: Teeth and Oropharynx as per pre-operative assessment

## 2020-07-07 ENCOUNTER — Inpatient Hospital Stay: Payer: Medicare Other

## 2020-07-07 MED ORDER — POLYETHYLENE GLYCOL 3350 17 G PO PACK
17.0000 g | PACK | Freq: Every day | ORAL | Status: DC
  Administered 2020-07-07 – 2020-07-09 (×3): 17 g via ORAL
  Filled 2020-07-07 (×3): qty 1

## 2020-07-07 MED ORDER — LIDOCAINE 5 % EX PTCH
1.0000 | MEDICATED_PATCH | CUTANEOUS | Status: DC
  Administered 2020-07-07 – 2020-07-09 (×3): 1 via TRANSDERMAL
  Filled 2020-07-07 (×5): qty 1

## 2020-07-07 MED ORDER — DEXAMETHASONE SODIUM PHOSPHATE 4 MG/ML IJ SOLN
4.0000 mg | Freq: Once | INTRAMUSCULAR | Status: AC
  Administered 2020-07-07: 4 mg via INTRAVENOUS
  Filled 2020-07-07: qty 1

## 2020-07-07 NOTE — Evaluation (Signed)
Occupational Therapy Evaluation Patient Details Name: Andrea Noble MRN: [id number] DOB: Jul 16, 1950 Today's Date: 07/07/2020    History of Present Illness 70 y/o female with h/o progressive back and R LE pain.  S/p  L4-S1 pedicle screw fixation, L4-5 TLIF (07/06/20).   Clinical Impression   Pt seen for OT evaluation this date. Prior to admission, pt was independent with BADLs, requiring only some assistance for donning shoes when in pain. Pt's spouse assisted with cooking and cleaning around the home. Pt reports that she was relatively active w/o AD prior to admission and spouse reports that she went on a 2-mile walk recently. Pt agreeable to eval and tx, however presenting with decreased sustained attention d/t pain; RN informed of pain. At beginning of session, pt was able to recall 1/3 back precautions following education provided in session with PT. Pt re-educated in back precautions and educated on application of back precautions during functional activities and modifications for bathing/dressing; handout provided to support recall and carry over of learned precautions/techniques. Pt currently requires MIN A for steadying during toilet transfers and toilet hygiene due to decreased balance and pain. Pt would benefit from additional skilled OT services to maximize recall and carryover of learned techniques and facilitate implementation of learned techniques into daily routines. Upon discharge, recommend SNF.      Follow Up Recommendations  SNF    Equipment Recommendations  Other (comment) (defer to next venue of care)       Precautions / Restrictions Precautions Precautions: Back Required Braces or Orthoses: Spinal Brace Spinal Brace: Lumbar corset Restrictions Weight Bearing Restrictions: No RLE Weight Bearing: Weight bearing as tolerated      Mobility Bed Mobility Overal bed mobility: Needs Assistance Bed Mobility: Rolling;Sidelying to Sit Rolling: Min guard;Min assist Sidelying  to sit: Min assist       General bed mobility comments: not assessed; pt in recliner upon arrival    Transfers Overall transfer level: Needs assistance Equipment used: Rolling walker (2 wheeled) Transfers: Sit to/from [facility] Sit to Stand: Min assist Stand pivot transfers: Min assist       General transfer comment: MIN A for steadying d/t pt reported pain and nausea    Balance Overall balance assessment: Needs assistance Sitting-balance support: Bilateral upper extremity supported;Feet supported Sitting balance-Leahy Scale: Good Sitting balance - Comments: Good static balance while sitting on BSC   Standing balance support: No upper extremity supported;During functional activity Standing balance-Leahy Scale: Poor Standing balance comment: MIN A for steadying during BSC transfer w/out AD                           ADL either performed or assessed with clinical judgement   ADL Overall ADL's : Needs assistance/impaired                         Toilet Transfer: Minimal assistance;Stand-pivot;BSC   Toileting- Clothing Manipulation and Hygiene: Minimal assistance;Sit to/from stand Toileting - Clothing Manipulation Details (indicate cue type and reason): MIN A for clothing management and steadying upon standing. Pt able to perform peri-care while adhering to spinal precautions     Functional mobility during ADLs: Minimal assistance                    Pertinent Vitals/Pain Pain Assessment: 0-10 Pain Score: 9  Pain Location: low back, increases with any mobility Pain Descriptors / Indicators: Aching Pain Intervention(s): Limited activity within patient's  tolerance;Monitored during session;Repositioned;Patient requesting pain meds-RN notified        Extremity/Trunk Assessment Upper Extremity Assessment Upper Extremity Assessment: Overall WFL for tasks assessed (limited resisted testing secondary to pain reactions)   Lower  Extremity Assessment Lower Extremity Assessment: Defer to PT evaluation       Communication Communication Communication: No difficulties   Cognition Arousal/Alertness: Awake/alert;Suspect due to medications Behavior During Therapy: Anxious Overall Cognitive Status: Difficult to assess                                 General Comments: Pt agreeable to eval and tx, however presenting with decreased sustained attention d/t pain. Able to recall 1/3 spinal precautions following education provided in session with PT.      Exercises Exercises: General Lower Extremity General Exercises - Lower Extremity Heel Slides: AROM;Strengthening;5 reps (resisted leg ext) Hip ABduction/ADduction: AROM;5 reps Straight Leg Raises: AROM;5 reps Other Exercises Other Exercises: Provided education on spinal precautions and implications of precautions during functional mobility; handout provided        Home Living Family/patient expects to be discharged to:: Private residence Living Arrangements: Spouse/significant other Available Help at Discharge: Family;Available 24 hours/day (husband can be home most of the time if/when needed) Type of Home: Independent living facility (Pt and spouse [facility] at [facility]) Home Access: Level entry     Home Layout: Able to live on main level with bedroom/bathroom     Bathroom Shower/Tub: Tub/shower unit;Walk-in shower         Home Equipment: Walker - 4 wheels;Grab bars - toilet;Grab bars - tub/shower          Prior Functioning/Environment Level of Independence: Independent        Comments: Pt reports that she was independent with BADLs, requiring only some assistance for donning shoes when in pain. Pt's spouse assists with cooking and cleaning around the home. Pt reports that she was relatively active w/o AD prior to admission and spouse reports that she went on a 2-mile walk recently.        OT Problem List: Decreased strength;Decreased  activity tolerance;Impaired balance (sitting and/or standing);Pain;Decreased knowledge of precautions      OT Treatment/Interventions: Self-care/ADL training;Therapeutic exercise;Energy conservation;DME and/or AE instruction;Therapeutic activities;Patient/family education;Balance training    OT Goals(Current goals can be found in the care plan section) Acute Rehab OT Goals Patient Stated Goal: Improve pain OT Goal Formulation: With patient Time For Goal Achievement: 07/21/20 Potential to Achieve Goals: Good ADL Goals Pt Will Perform Grooming: with supervision;standing Pt Will Transfer to Toilet: with supervision;ambulating;regular height toilet Pt Will Perform Toileting - Clothing Manipulation and hygiene: with supervision;sit to/from stand  OT Frequency: Min 1X/week          AM-PAC OT "6 Clicks" Daily Activity     Outcome Measure Help from another person eating meals?: None Help from another person taking care of personal grooming?: None Help from another person toileting, which includes using toliet, bedpan, or urinal?: A Little Help from another person bathing (including washing, rinsing, drying)?: A Lot Help from another person to put on and taking off regular upper body clothing?: A Little Help from another person to put on and taking off regular lower body clothing?: A Lot 6 Click Score: 18   End of Session Nurse Communication: Mobility status;Patient requests pain meds  Activity Tolerance: Patient limited by pain Patient left: in chair;with call bell/phone within reach;with chair alarm set;with nursing/sitter  in room;with family/visitor present  OT Visit Diagnosis: Unsteadiness on feet (R26.81);Pain Pain - Right/Left:  (back) Pain - part of body:  (back)                Time: 9021-1155 OT Time Calculation (min): 27 min Charges:  OT General Charges $OT Visit: 1 Visit OT Evaluation $OT Eval Moderate Complexity: 1 Mod OT Treatments $Self Care/Home Management : 8-22  mins  Fredirick Maudlin, OTR/L [place]

## 2020-07-07 NOTE — Anesthesia Postprocedure Evaluation (Signed)
Anesthesia Post Note  Patient: Andrea Noble  Procedure(s) Performed: L4/5, L5/S1 TRANSFORAMINAL LUMBAR INTERBODY FUSION (TLIF), L4-S1 PEDICLE SCREWS,DECOMPRESSION  (Right )  Patient location during evaluation: PACU Anesthesia Type: General Level of consciousness: awake and alert and oriented Pain management: pain level controlled Vital Signs Assessment: post-procedure vital signs reviewed and stable Respiratory status: spontaneous breathing Cardiovascular status: blood pressure returned to baseline Anesthetic complications: no   No complications documented.   Last Vitals:  Vitals:   07/06/20 2154 07/07/20 0431  BP: (!) 151/89 122/61  Pulse: 98 (!) 101  Resp: 17   Temp: (!) 36.3 C 36.6 C  SpO2: 99% 99%    Last Pain:  Vitals:   07/07/20 0624  TempSrc:   PainSc: 6                  Arielis Leonhart

## 2020-07-07 NOTE — Progress Notes (Signed)
Progress Note   Date: 07/07/2020  HPI:Andrea Noble is here for evaluation of ongoing pain in the back and right leg. She says that the back pain has been there for some time and she does have some previous left leg pain but currently the right leg pain started 3 to 6 months ago. She does not remember any inciting event. She does feel that the pain will go down the lateral side of the leg into the calf and sometimes into the foot. She sometimes will get tingling in the foot. She is not experiencing this in the left leg currently. The leg pain is more bothersome than the back pain to her, however they both of limit how much activity she can do including walking. She has been taking gabapentin at nighttime occasionally. She does take NSAIDs. She recently underwent an injection and she felt like she did not get any significant relief from this but also that it may have worsened some bowel and bladder issues that she has been dealing with over the past year. She does state that she has more urinary urgency and will sometimes have stool with urination. She has not had this evaluated previously. 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  She is on Zofran and Phenergan for this.  She is not endorsing any leg pain.  Her strength and sensation is intact.  Her Foley catheter was removed this morning.  Her JP drain had output of 120 cc   Vital Signs: Temp:  [97.1 F (36.2 C)-98.6 F (37 C)] 97.9 F (36.6 C) (03/08 0431) Pulse Rate:  [77-115] 101 (03/08 0431) Resp:  [10-19] 17 (03/07 2154) BP: (98-173)/(55-92) 122/61 (03/08 0431) SpO2:  [96 %-100 %] 99 % (03/08 0431) Weight:  [64 kg] 64 kg (03/07 1135) Temp (24hrs), Avg:97.6 F (36.4 C), Min:97.1 F (36.2 C), Max:98.6 F (37 C)  Weight: 64 kg  Problem List Patient Active Problem List   Diagnosis Date Noted  .  Lumbar radiculopathy 07/06/2020  . History of Hodgkin's lymphoma 03/01/2017    Medications: Scheduled Meds: . acetaminophen  1,000 mg Oral QHS  . buPROPion  200 mg Oral BID  . busPIRone  15 mg Oral BID  . calcium-vitamin D  1 tablet Oral Q breakfast  . darifenacin  15 mg Oral Daily  . diclofenac Sodium  1 application Topical BID  . docusate sodium  100 mg Oral BID  . enoxaparin (LOVENOX) injection  40 mg Subcutaneous Q24H  . estrogen (conjugated)-medroxyprogesterone  1 tablet Oral Daily  . fluticasone  1 spray Each Nare BH-q7a  . gabapentin  200 mg Oral TID  . Ivermectin  1 application Topical Daily  . levothyroxine  50 mcg Oral Once per day on Sun Tue Thu Sat  . [START ON 07/08/2020] levothyroxine  75 mcg Oral Q M,W,F  . liothyronine  2.5 mcg Oral Daily  . melatonin  10 mg Oral QHS  . mometasone-formoterol  2 puff Inhalation BID  . montelukast  10 mg Oral QHS  . multivitamin with minerals  1 tablet Oral Daily  . pantoprazole  40 mg Oral BH-q7a  . polyethylene glycol powder  17 g Oral Daily  . pravastatin  80 mg Oral QHS  . psyllium  1 packet Oral QHS  . saccharomyces boulardii  500 mg Oral Daily  . sodium  chloride  1 spray Each Nare BID  . sodium chloride flush  3 mL Intravenous Q12H  . traZODone  50 mg Oral QHS   Continuous Infusions: . sodium chloride    . sodium chloride 75 mL/hr at 07/07/20 0656  . methocarbamol (ROBAXIN) IV Stopped (07/06/20 1921)   PRN Meds:.acetaminophen **OR** acetaminophen, albuterol, fluocinonide cream, meclizine, menthol-cetylpyridinium **OR** phenol, methocarbamol **OR** methocarbamol (ROBAXIN) IV, morphine injection, ondansetron **OR** ondansetron (ZOFRAN) IV, oxyCODONE, oxyCODONE, polyvinyl alcohol, sodium chloride flush  Labs:  Lab Results  Component Value Date   WBC 5.3 07/06/2020   WBC 5.0 06/02/2020   HCT 36.8 07/06/2020   HCT 37.8 06/02/2020   PLT 223 07/06/2020   PLT 276 06/02/2020    Lab Results  Component Value Date   INR 1.0  06/02/2020   APTT 28 06/02/2020   Lab Results  Component Value Date   NA 131 (L) 06/02/2020   NA 125 (L) 06/23/2018   K 4.1 06/02/2020   K 3.8 06/23/2018   BUN 9 06/02/2020   BUN 8 06/23/2018   No results found for: MG  Exam: Awake, alert 5-5 strength in bilateral hip flexion, knee flexion, knee extension, dorsiflexion, plantarflexion Sensation intact in bilateral lower extremities Dressings with mild saturation, JP bulb in place   Imaging Lumbar spine x-rays pending   Assessment/plan Andrea Noble is postop day 1 from L4-S1 pedicle screw fixation, L4-5 TLIF  -Ambulate today, PT/OT ordered -Lumbar spine x-rays ordered -JP drain with moderate output, will keep today -Lovenox for DVT prophylaxis -Transition to oral pain regimen -Foley catheter removed, follow-up void -Will increase medications for nausea, monitor food intake   Deetta Perla, MD [phone]

## 2020-07-07 NOTE — Evaluation (Signed)
Physical Therapy Evaluation Patient Details Name: Andrea Noble MRN: [id number] DOB: 07-06-1950 Today's Date: 07/07/2020   History of Present Illness  70 y/o female with h/o progressive back and R LE pain.  S/p  L4-S1 pedicle screw fixation, L4-5 TLIF 07/06/20.  Clinical Impression  Pt struggled with pain and nausea the entire PT session.  She was able to very slowly and repeated cuing for appropriate positioning, strategy and precautions was able to get to EOB with light assist and rise to standing w/o assist.  During ambulation, however, pt was extremely guarded and could manage little more than a shuffle step or 2 before needing to stop and reset with breathing, reorientation to the task at hand.  Pt with consistent dry heaves, c/o significant low back pain and generally was very functionally limited t/o PT exam and activity.     Follow Up Recommendations Follow surgeon's recommendation for DC plan and follow-up therapies;Supervision/Assistance - 24 hour (Per today's performance w/o improvement she would need STR, per improvement she hopes to go home with husband.)    Equipment Recommendations  Rolling walker with 5" wheels (has 4WW, may be sufficient per improvement)    Recommendations for Other Services       Precautions / Restrictions Precautions Precautions: Back Required Braces or Orthoses: Spinal Brace Spinal Brace: Lumbar corset Restrictions Weight Bearing Restrictions: No RLE Weight Bearing: Weight bearing as tolerated      Mobility  Bed Mobility Overal bed mobility: Needs Assistance Bed Mobility: Rolling;Sidelying to Sit Rolling: Min guard;Min assist Sidelying to sit: Min assist       General bed mobility comments: light assist to insure nautral spine, appropraite/safe mechanics.    Transfers Overall transfer level: Modified independent Equipment used: Rolling walker (2 wheeled)             General transfer comment: pt with definite need of UEs to  sit<>stand, no direct assist needed, though pt with some near buckling and close CGA t/o the effort  Ambulation/Gait Ambulation/Gait assistance: Min assist Gait Distance (Feet): 4 Feet Assistive device: Rolling walker (2 wheeled)       General Gait Details: Pt with pain and nausea the entire time in standing, could not manage more than 2 small shuffling steps at a time before needing to stop and dry heave, wimper in pain and psych herself up to do more.  Stairs            Wheelchair Mobility    Modified Rankin (Stroke Patients Only)       Balance Overall balance assessment: Needs assistance Sitting-balance support: Bilateral upper extremity supported Sitting balance-Leahy Scale: Good     Standing balance support: Bilateral upper extremity supported Standing balance-Leahy Scale: Fair Standing balance comment: no overt buckling, but near buckling with essentially all weight shifts, reliant on walker/UEs                             Pertinent Vitals/Pain Pain Assessment: 0-10 Pain Score: 7  Pain Location: low back, increases with any mobility    Home Living Family/patient expects to be discharged to:: Unsure                      Prior Function Level of Independence: Independent         Comments: Pt reports that she could be relatively active w/o AD     Hand Dominance        Extremity/Trunk Assessment  Upper Extremity Assessment Upper Extremity Assessment: Generalized weakness (limited resisted testing secondary to pain reactions)    Lower Extremity Assessment Lower Extremity Assessment: Generalized weakness (R LE grossly functional but pain limited, L testing pain limited as well but does appear weaker than R (grossly 3+/5))       Communication   Communication: No difficulties  Cognition Arousal/Alertness: Awake/alert;Suspect due to medications Behavior During Therapy: Anxious Overall Cognitive Status: Difficult to assess                                  General Comments: Pt struggled to maintain cogent lines of thought/conversation.  Repeats that she feels the meds are making her confused      General Comments      Exercises General Exercises - Lower Extremity Heel Slides: AROM;Strengthening;5 reps (resisted leg ext) Hip ABduction/ADduction: AROM;5 reps Straight Leg Raises: AROM;5 reps   Assessment/Plan    PT Assessment Patient needs continued PT services  PT Problem List Decreased strength;Decreased range of motion;Decreased activity tolerance;Decreased mobility;Decreased balance;Decreased coordination;Decreased cognition;Decreased knowledge of use of DME;Decreased safety awareness;Pain;Decreased knowledge of precautions       PT Treatment Interventions DME instruction;Gait training;Stair training;Functional mobility training;Therapeutic activities;Therapeutic exercise;Balance training;Neuromuscular re-education;Patient/family education    PT Goals (Current goals can be found in the Care Plan section)  Acute Rehab PT Goals Patient Stated Goal: control pain PT Goal Formulation: With patient Time For Goal Achievement: 07/21/20 Potential to Achieve Goals: Fair    Frequency 7X/week   Barriers to discharge        Co-evaluation               AM-PAC PT "6 Clicks" Mobility  Outcome Measure Help needed turning from your back to your side while in a flat bed without using bedrails?: A Little Help needed moving from lying on your back to sitting on the side of a flat bed without using bedrails?: A Little Help needed moving to and from a bed to a chair (including a wheelchair)?: A Little Help needed standing up from a chair using your arms (e.g., wheelchair or bedside chair)?: A Little Help needed to walk in hospital room?: A Lot Help needed climbing 3-5 steps with a railing? : A Lot 6 Click Score: 16    End of Session Equipment Utilized During Treatment: Gait belt;Back  brace Activity Tolerance: Patient limited by pain (nausea t/o session) Patient left: with call bell/phone within reach;with chair alarm set   PT Visit Diagnosis: Muscle weakness (generalized) (M62.81);Difficulty in walking, not elsewhere classified (R26.2);Pain Pain - Right/Left: Left Pain - part of body:  (lumbago)    Time: 0240-9735 PT Time Calculation (min) (ACUTE ONLY): 33 min   Charges:   PT Evaluation $PT Eval Low Complexity: 1 Low PT Treatments $Therapeutic Activity: 8-22 mins        Kreg Shropshire, DPT 07/07/2020, 10:32 AM

## 2020-07-08 ENCOUNTER — Encounter: Payer: Self-pay | Admitting: Neurosurgery

## 2020-07-08 MED ORDER — OXYCODONE HCL 5 MG PO TABS: 5.0000 mg | ORAL_TABLET | Freq: Four times a day (QID) | ORAL | 0 refills | Status: DC | PRN

## 2020-07-08 MED ORDER — ONDANSETRON HCL 4 MG PO TABS: 4.0000 mg | ORAL_TABLET | Freq: Three times a day (TID) | ORAL | 0 refills | Status: DC | PRN

## 2020-07-08 MED ORDER — METHOCARBAMOL 500 MG PO TABS: 500.0000 mg | ORAL_TABLET | Freq: Three times a day (TID) | ORAL | 1 refills | Status: DC | PRN

## 2020-07-08 NOTE — Progress Notes (Signed)
Progress Note   Date: 07/08/2020  HPI:Andrea Noble is here for evaluation of ongoing pain in the back and right leg. She says that the back pain has been there for some time and she does have some previous left leg pain but currently the right leg pain started 3 to 6 months ago. She does not remember any inciting event. She does feel that the pain will go down the lateral side of the leg into the calf and sometimes into the foot. She sometimes will get tingling in the foot. She is not experiencing this in the left leg currently. The leg pain is more bothersome than the back pain to her, however they both of limit how much activity she can do including walking. She has been taking gabapentin at nighttime occasionally. She does take NSAIDs. She recently underwent an injection and she felt like she did not get any significant relief from this but also that it may have worsened some bowel and bladder issues that she has been dealing with over the past year. She does state that she has more urinary urgency and will sometimes have stool with urination. She has not had this evaluated previously. 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  She has not ambulated significantly.  She has not tolerated significant PO.   3/8: Andrea Noble is stable with expected back pain but persistent nausea.  She is on Zofran and Phenergan for this.  She is not endorsing any leg pain.  Her strength and sensation is intact.  Her Foley catheter was removed this morning.  Her JP drain had output of 120 cc   Vital Signs: Temp:  [97.6 F (36.4 C)-98.3 F (36.8 C)] 97.9 F (36.6 C) (03/09 0524) Pulse Rate:  [97-101] 100 (03/09 0524) Resp:  [15-18] 17 (03/08 2354) BP: (132-143)/(65-85) 142/85 (03/09 0524) SpO2:  [99 %-100 %] 100 % (03/09 0524) Temp (24hrs), Avg:98 F (36.7 C), Min:97.6 F (36.4 C), Max:98.3 F  (36.8 C)  Weight: 64 kg  Problem List Patient Active Problem List   Diagnosis Date Noted   Lumbar radiculopathy 07/06/2020   History of Hodgkin's lymphoma 03/01/2017    Medications: Scheduled Meds:  acetaminophen  1,000 mg Oral QHS   buPROPion  200 mg Oral BID   busPIRone  15 mg Oral BID   calcium-vitamin D  1 tablet Oral Q breakfast   darifenacin  15 mg Oral Daily   diclofenac Sodium  1 application Topical BID   docusate sodium  100 mg Oral BID   enoxaparin (LOVENOX) injection  40 mg Subcutaneous Q24H   estrogen (conjugated)-medroxyprogesterone  1 tablet Oral Daily   fluticasone  1 spray Each Nare BH-q7a   gabapentin  200 mg Oral TID   Ivermectin  1 application Topical Daily   levothyroxine  50 mcg Oral Once per day on Sun Tue Thu Sat   levothyroxine  75 mcg Oral Q M,W,F   lidocaine  1 patch Transdermal Q24H   liothyronine  2.5 mcg Oral Daily   melatonin  10 mg Oral QHS   mometasone-formoterol  2 puff Inhalation BID   montelukast  10 mg Oral QHS   multivitamin with minerals  1 tablet Oral Daily   pantoprazole  40 mg Oral BH-q7a   polyethylene glycol  17 g Oral Daily   pravastatin  80 mg Oral QHS  psyllium  1 packet Oral QHS   saccharomyces boulardii  500 mg Oral Daily   sodium chloride  1 spray Each Nare BID   sodium chloride flush  3 mL Intravenous Q12H   traZODone  50 mg Oral QHS   Continuous Infusions:  sodium chloride     sodium chloride 75 mL/hr at 07/07/20 0656   methocarbamol (ROBAXIN) IV Stopped (07/06/20 1921)   PRN Meds:.acetaminophen **OR** acetaminophen, albuterol, fluocinonide cream, meclizine, menthol-cetylpyridinium **OR** phenol, methocarbamol **OR** methocarbamol (ROBAXIN) IV, morphine injection, ondansetron **OR** ondansetron (ZOFRAN) IV, oxyCODONE, oxyCODONE, polyvinyl alcohol, sodium chloride flush  Labs:  Lab Results  Component Value Date   WBC 5.3 07/06/2020   WBC 5.0 06/02/2020   HCT 36.8 07/06/2020    HCT 37.8 06/02/2020   PLT 223 07/06/2020   PLT 276 06/02/2020    Lab Results  Component Value Date   INR 1.0 06/02/2020   APTT 28 06/02/2020    Lab Results  Component Value Date   NA 131 (L) 06/02/2020   NA 125 (L) 06/23/2018   K 4.1 06/02/2020   K 3.8 06/23/2018   BUN 9 06/02/2020   BUN 8 06/23/2018   No results found for: MG  Exam: Awake, alert 5-5 strength in bilateral hip flexion, knee flexion, knee extension, dorsiflexion, plantarflexion Sensation intact in bilateral lower extremities Dressings with mild saturation, JP bulb in place   Imaging Lumbar spine x-rays reviewed - good placement   Assessment/plan Andrea Noble is postop day 2 from L4-S1 pedicle screw fixation, L4-5 TLIF  -Ambulate today, PT/OT ordered -Lumbar spine reviewed - good placement -JP drain with moderate output, will keep for now pending dispo planning -Lovenox for DVT prophylaxis -Transition to oral pain regimen -Foley catheter removed, follow-up void -encourage PO intake -mobilize  Meade Maw, [place]

## 2020-07-08 NOTE — Discharge Instructions (Signed)
NEUROSURGERY DISCHARGE INSTRUCTIONS  Admission diagnosis: Lumbar radiculopathy [M54.16]  Operative procedure: L4/5 TLIF, L4-S1 fusion  What to do after you leave the hospital:  Recommended diet: regular diet. Increase protein intake to promote wound healing.  Recommended activity: no lifting or strenuous exercise for 6 weeks. No driving for 6 weeks.You should walk multiple times per day  Special Instructions  No straining, no heavy lifting > 10lbs x 6 weeks.  Keep incision area clean and dry. May shower. No baths or pools for 6 weeks.  Please remove dressing at home, no need to apply a bandage afterwards  You have staples that will be removed in clinic.   Please take pain medications as directed. Take a stool softener if on pain medications   Please Report any of the following: Increased Nausea or Vomiting, Temperature is greater than 101.60F (38.1C) degrees, Dizziness, Abdominal Pain, Difficulty Breathing or Shortness of Breath, Inability to Eat, drink Fluids, or Take medications, Bleeding, swelling, or drainage from surgical incision sites, New numbness or weakness, and Bowel or bladder dysfunction to the neurosurgeon on call at [phone]  Additional Follow up appointments Please follow up with Dr Lacinda Axon in [place] clinic as scheduled in 2-3 weeks   Please see below for scheduled appointments:  No future appointments.

## 2020-07-08 NOTE — Progress Notes (Signed)
Physical Therapy Treatment Patient Details Name: Andrea Noble MRN: [id number] DOB: 09/01/1950 Today's Date: 07/08/2020    History of Present Illness 70 y/o female with h/o progressive back and R LE pain.  S/p  L4-S1 pedicle screw fixation, L4-5 TLIF (07/06/20).    PT Comments    Pt did much better with PT this date.  She was able to roll and get up to sitting w/o assist.  Educated husband on donning corset (re-educated pt) and further educated on course of recovery, expectations, etc.  Pt still c/o some pain, but far more mild than yesterday.  Pt showing appropriate improvement POD2.  Follow Up Recommendations  Supervision - Intermittent;Follow surgeon's recommendation for DC plan and follow-up therapies     Equipment Recommendations  None recommended by PT    Recommendations for Other Services       Precautions / Restrictions Precautions Precautions: Back Required Braces or Orthoses: Spinal Brace Restrictions RLE Weight Bearing: Weight bearing as tolerated    Mobility  Bed Mobility Overal bed mobility: Modified Independent Bed Mobility: Rolling;Sidelying to Sit Rolling: Supervision Sidelying to sit: Supervision       General bed mobility comments: Pt reliant on UE use on rails, but able to get to sitting EOB w/o assist apart from verbal cuing    Transfers Overall transfer level: Needs assistance Equipment used: Rolling walker (2 wheeled) Transfers: Sit to/from Stand Sit to Stand: Min guard         General transfer comment: Pt able to rise today with only minimal cuing for set up, sequencing  Ambulation/Gait Ambulation/Gait assistance: Supervision Gait Distance (Feet): 200 Feet Assistive device: Rolling walker (2 wheeled);None       General Gait Details: Pt was able to circumambulate the nurses' station with slow but steady and safe cadence.  She used the walker for the first 50 ft and then no AD for the remainder.  Pt's vitals were stable with some mild  fatigue.   Stairs             Wheelchair Mobility    Modified Rankin (Stroke Patients Only)       Balance Overall balance assessment: Needs assistance Sitting-balance support: Bilateral upper extremity supported;Feet supported Sitting balance-Leahy Scale: Good     Standing balance support: No upper extremity supported;During functional activity Standing balance-Leahy Scale: Fair Standing balance comment: Pt minimally cautious, but able to maintain standing balance w/o UEs                            Cognition Arousal/Alertness: Awake/alert;Suspect due to medications Behavior During Therapy: WFL for tasks assessed/performed Overall Cognitive Status: Difficult to assess                                        Exercises      General Comments        Pertinent Vitals/Pain Pain Assessment: 0-10 Pain Score: 3     Home Living                      Prior Function            PT Goals (current goals can now be found in the care plan section) Progress towards PT goals: Progressing toward goals    Frequency    7X/week      PT Plan Current plan remains  appropriate    Co-evaluation              AM-PAC PT "6 Clicks" Mobility   Outcome Measure  Help needed turning from your back to your side while in a flat bed without using bedrails?: None Help needed moving from lying on your back to sitting on the side of a flat bed without using bedrails?: None Help needed moving to and from a bed to a chair (including a wheelchair)?: A Little Help needed standing up from a chair using your arms (e.g., wheelchair or bedside chair)?: None Help needed to walk in hospital room?: A Lot Help needed climbing 3-5 steps with a railing? : A Lot 6 Click Score: 19    End of Session Equipment Utilized During Treatment: Gait belt;Back brace Activity Tolerance: Patient tolerated treatment well Patient left: with call bell/phone within  reach;with chair alarm set   PT Visit Diagnosis: Muscle weakness (generalized) (M62.81);Difficulty in walking, not elsewhere classified (R26.2);Pain Pain - part of body:  (lumbago)     Time: 4010-2725 PT Time Calculation (min) (ACUTE ONLY): 30 min  Charges:  $Therapeutic Exercise: 8-22 mins $Therapeutic Activity: 8-22 mins                     Kreg Shropshire, DPT 07/08/2020, 1:22 PM

## 2020-07-08 NOTE — Progress Notes (Signed)
Occupational Therapy Treatment Patient Details Name: Andrea Noble MRN: [id number] DOB: [birth year] Today's Date: 07/08/2020    History of present illness 70 y/o female with h/o progressive back and R LE pain.  S/p  L4-S1 pedicle screw fixation, L4-5 TLIF (07/06/20).   OT comments  Andrea Noble presents today with generalized weakness and reduced endurance, limiting her ability to engage in functional mobility tasks. She reports that her pain has improved since yesterday, is now 5/10 in supine, 6/10 with ambulation and when sitting in recliner. Pt ambulates with RW and MinA, exhibiting some guarding behavior, no LOB. Provided educ re: spinal precautions, donning/doffing back brace. Pt and husband both express understanding, provide teach-back. Pt making good progress toward goals and will continue to benefit from acute OT services. Discharge to SNF within [facility] remains an appropriate DC plan.   Follow Up Recommendations  SNF    Equipment Recommendations       Recommendations for Other Services      Precautions / Restrictions Precautions Precautions: Back Precaution Booklet Issued: Yes (comment) Required Braces or Orthoses: Spinal Brace Spinal Brace: Lumbar corset Restrictions Weight Bearing Restrictions: No RLE Weight Bearing: Weight bearing as tolerated       Mobility Bed Mobility Overal bed mobility: Modified Independent Bed Mobility: Rolling;Sit to Sidelying Rolling: Supervision Sidelying to sit: Mod assist       General bed mobility comments: Requires assist lifting LE to bed level    Transfers Overall transfer level: Needs assistance Equipment used: Rolling walker (2 wheeled) Transfers: Sit to/from Stand Sit to Stand: Min guard Stand pivot transfers: Min guard       General transfer comment: increased time, effort for sit<stand    Balance Overall balance assessment: Needs assistance Sitting-balance support: Bilateral upper extremity supported;Feet  supported Sitting balance-Leahy Scale: Good     Standing balance support: During functional activity;Bilateral upper extremity supported Standing balance-Leahy Scale: Fair Standing balance comment: Pt minimally cautious, but able to maintain standing balance w/o UEs                           ADL either performed or assessed with clinical judgement   ADL Overall ADL's : Needs assistance/impaired Eating/Feeding: Independent                                   Functional mobility during ADLs: Minimal assistance       Vision Patient Visual Report: No change from baseline     Perception     Praxis      Cognition Arousal/Alertness: Awake/alert;Suspect due to medications Behavior During Therapy: WFL for tasks assessed/performed Overall Cognitive Status: Within Functional Limits for tasks assessed                                 General Comments: Pt able to recall 3/3 spinal precautions        Exercises Other Exercises Other Exercises: Educ re: spinal precautions, AE   Shoulder Instructions       General Comments      Pertinent Vitals/ Pain       Pain Assessment: 0-10 Pain Score: 5  Pain Location: low back, increases with any mobility and with sitting in recliner Pain Descriptors / Indicators: Aching Pain Intervention(s): Limited activity within patient's tolerance;Repositioned;Monitored during session  Home Living  Prior Functioning/Environment              Frequency  Min 1X/week        Progress Toward Goals  OT Goals(current goals can now be found in the care plan section)  Progress towards OT goals: Progressing toward goals  Acute Rehab OT Goals Patient Stated Goal: Improve pain OT Goal Formulation: With patient Time For Goal Achievement: 07/21/20 Potential to Achieve Goals: Good  Plan Discharge plan remains appropriate;Frequency remains appropriate     Co-evaluation                 AM-PAC OT "6 Clicks" Daily Activity     Outcome Measure   Help from another person eating meals?: None Help from another person taking care of personal grooming?: A Little Help from another person toileting, which includes using toliet, bedpan, or urinal?: A Little Help from another person bathing (including washing, rinsing, drying)?: A Lot Help from another person to put on and taking off regular upper body clothing?: A Little Help from another person to put on and taking off regular lower body clothing?: A Lot 6 Click Score: 17    End of Session Equipment Utilized During Treatment: Rolling walker;Back brace  OT Visit Diagnosis: Unsteadiness on feet (R26.81);Pain   Activity Tolerance Patient tolerated treatment well   Patient Left in bed;with call bell/phone within reach;with family/visitor present;with bed alarm set   Nurse Communication          Time: 1572-6203 OT Time Calculation (min): 24 min  Charges: OT General Charges $OT Visit: 1 Visit OT Treatments $Self Care/Home Management : 8-22 mins $Therapeutic Exercise: 8-22 mins  Josiah Lobo, PhD, MS, OTR/L 07/08/20, 1:57 PM

## 2020-07-08 NOTE — NC FL2 (Signed)
[place] LEVEL OF CARE SCREENING TOOL     IDENTIFICATION  Patient Name: Andrea Noble Birthdate: 07-05-1950 Sex: female Admission Date (Current Location): 07/06/2020  [place] and [place] Number:  Engineering geologist and Address:  [facility], [address], [place], [place] [address]      Provider Number: [id number]  Attending Physician Name and Address:  Deetta Perla, MD  Relative Name and Phone Number:  Yam,Dwight E. (Spouse)   [phone] (Mobile)    Current Level of Care: Hospital Recommended Level of Care: Other (Comment) (SNF for short term rehabilitation) Prior Approval Number:    Date Approved/Denied:   PASRR Number: in process (Applied for [employer] ref [id number])  Discharge Plan: SNF (Short Term Rehab)    Current Diagnoses: Patient Active Problem List   Diagnosis Date Noted  . Lumbar radiculopathy 07/06/2020  . History of Hodgkin's lymphoma 03/01/2017    Orientation RESPIRATION BLADDER Height & Weight     Self,Time,Situation,Place  Normal Continent Weight: 64 kg Height:  5\' 4"  (162.6 cm)  BEHAVIORAL SYMPTOMS/MOOD NEUROLOGICAL BOWEL NUTRITION STATUS  Other (Comment)   Continent Diet (Regular)  AMBULATORY STATUS COMMUNICATION OF NEEDS Skin   Limited Assist Verbally Surgical wounds (Adhesive bandage to back)                       Personal Care Assistance Level of Assistance  Bathing,Feeding,Dressing Bathing Assistance: Limited assistance Feeding assistance: Limited assistance Dressing Assistance: Limited assistance     Functional Limitations Info  Sight,Hearing,Speech Sight Info: Adequate Hearing Info: Adequate Speech Info: Adequate    SPECIAL CARE FACTORS FREQUENCY  PT (By licensed PT),OT (By licensed OT)                    Contractures Contractures Info: Not present    Additional Factors Info  Code Status Code Status Info: Full Code             Current Medications (07/08/2020):   This is the current hospital active medication list Current Facility-Administered Medications  Medication Dose Route Frequency Provider Last Rate Last Admin  . 0.9 %  sodium chloride infusion  250 mL Intravenous Continuous Deetta Perla, MD      . 0.9 %  sodium chloride infusion   Intravenous Continuous Deetta Perla, MD 75 mL/hr at 07/08/20 0827 New Bag at 07/08/20 0827  . acetaminophen (TYLENOL) tablet 650 mg  650 mg Oral Q6H PRN Deetta Perla, MD       Or  . acetaminophen (TYLENOL) suppository 650 mg  650 mg Rectal Q4H PRN Deetta Perla, MD      . acetaminophen (TYLENOL) tablet 1,000 mg  1,000 mg Oral QHS Deetta Perla, MD   1,000 mg at 07/07/20 2156  . albuterol (VENTOLIN HFA) 108 (90 Base) MCG/ACT inhaler 2 puff  2 puff Inhalation Q4H PRN Deetta Perla, MD      . buPROPion [facility]) 12 hr tablet 200 mg  200 mg Oral BID Deetta Perla, MD   200 mg at 07/08/20 0818  . busPIRone (BUSPAR) tablet 15 mg  15 mg Oral BID Deetta Perla, MD   15 mg at 07/08/20 0816  . calcium-vitamin D (OSCAL WITH D) 500-200 MG-UNIT per tablet 1 tablet  1 tablet Oral Q breakfast Deetta Perla, MD   1 tablet at 07/08/20 0817  . darifenacin (ENABLEX) 24 hr tablet 15 mg  15 mg Oral Daily Deetta Perla, MD      . diclofenac  Sodium (VOLTAREN) 1 % topical gel 1 application  1 application Topical BID Deetta Perla, MD   1 application at 76/28/31 [phone]  . docusate sodium (COLACE) capsule 100 mg  100 mg Oral BID Deetta Perla, MD   100 mg at 07/08/20 0817  . enoxaparin (LOVENOX) injection 40 mg  40 mg Subcutaneous Q24H Deetta Perla, MD   40 mg at 07/08/20 0817  . estrogen (conjugated)-medroxyprogesterone (PREMPRO) 0.45-1.5 MG per tablet 1 tablet  1 tablet Oral Daily Deetta Perla, MD   1 tablet at 07/08/20 1021  . fluocinonide cream (LIDEX) 1.60 % 1 application  1 application Topical Daily PRN Deetta Perla, MD      . fluticasone [facility]) 50 MCG/ACT nasal spray 1 spray  1 spray Each Nare Balinda Quails, MD   1 spray at 07/08/20  [phone]  . gabapentin (NEURONTIN) capsule 200 mg  200 mg Oral TID Deetta Perla, MD   200 mg at 07/08/20 0816  . Ivermectin 1 % CREA 1 application  1 application Topical Daily Deetta Perla, MD   1 application at 10/25/92 [phone]  . levothyroxine (SYNTHROID) tablet 50 mcg  50 mcg Oral Once per day on Sun Tue Thu Sat Deetta Perla, MD   50 mcg at 07/07/20 0543  . levothyroxine (SYNTHROID) tablet 75 mcg  75 mcg Oral Q M,W,F Deetta Perla, MD   75 mcg at 07/08/20 0610  . lidocaine (LIDODERM) 5 % 1 patch  1 patch Transdermal Q24H Deetta Perla, MD   1 patch at 07/08/20 [phone]  . liothyronine (CYTOMEL) tablet 2.5 mcg  2.5 mcg Oral Daily Deetta Perla, MD   2.5 mcg at 07/08/20 0610  . meclizine (ANTIVERT) tablet 25 mg  25 mg Oral Daily PRN Deetta Perla, MD   25 mg at 07/08/20 0818  . melatonin tablet 10 mg  10 mg Oral QHS Deetta Perla, MD   10 mg at 07/07/20 2155  . menthol-cetylpyridinium (CEPACOL) lozenge 3 mg  1 lozenge Oral PRN Deetta Perla, MD       Or  . phenol (CHLORASEPTIC) mouth spray 1 spray  1 spray Mouth/Throat PRN Deetta Perla, MD      . methocarbamol (ROBAXIN) tablet 500 mg  500 mg Oral Q6H PRN Deetta Perla, MD   500 mg at 07/08/20 5009   Or  . methocarbamol (ROBAXIN) 500 mg in dextrose 5 % 50 mL IVPB  500 mg Intravenous Q6H PRN Deetta Perla, MD   Stopped at 07/06/20 1921  . mometasone-formoterol (DULERA) 100-5 MCG/ACT inhaler 2 puff  2 puff Inhalation BID Deetta Perla, MD   2 puff at 07/08/20 [phone]  . montelukast (SINGULAIR) tablet 10 mg  10 mg Oral QHS Deetta Perla, MD   10 mg at 07/07/20 2156  . morphine 2 MG/ML injection 2 mg  2 mg Intravenous Q2H PRN Deetta Perla, MD   2 mg at 07/07/20 0153  . multivitamin with minerals tablet 1 tablet  1 tablet Oral Daily Deetta Perla, MD   1 tablet at 07/08/20 [phone]  . ondansetron (ZOFRAN) tablet 4 mg  4 mg Oral Q6H PRN Deetta Perla, MD   4 mg at 07/07/20 7169   Or  . ondansetron [facility]) injection 4 mg  4 mg Intravenous Q6H PRN Deetta Perla, MD   4 mg at  07/07/20 1854  . oxyCODONE (Oxy IR/ROXICODONE) immediate release tablet 10 mg  10 mg Oral Q4H PRN Deetta Perla, MD   10 mg at 07/07/20 0539  . oxyCODONE (Oxy IR/ROXICODONE) immediate  release tablet 5 mg  5 mg Oral Q4H PRN Deetta Perla, MD   5 mg at 07/08/20 6767  . pantoprazole (PROTONIX) EC tablet 40 mg  40 mg Oral Balinda Quails, MD   40 mg at 07/08/20 0610  . polyethylene glycol (MIRALAX / GLYCOLAX) packet 17 g  17 g Oral Daily Deetta Perla, MD   17 g at 07/08/20 0816  . polyvinyl alcohol (LIQUIFILM TEARS) 1.4 % ophthalmic solution 1 drop  1 drop Both Eyes PRN Deetta Perla, MD      . pravastatin (PRAVACHOL) tablet 80 mg  80 mg Oral Darnelle Catalan, MD   80 mg at 07/07/20 2155  . psyllium (HYDROCIL/METAMUCIL) 1 packet  1 packet Oral QHS Deetta Perla, MD      . saccharomyces boulardii (FLORASTOR) capsule 500 mg  500 mg Oral Daily Deetta Perla, MD   500 mg at 07/08/20 0817  . sodium chloride (OCEAN) 0.65 % nasal spray 1 spray  1 spray Each Nare BID Deetta Perla, MD   1 spray at 07/08/20 0831  . sodium chloride flush (NS) 0.9 % injection 3 mL  3 mL Intravenous Q12H Deetta Perla, MD   3 mL at 07/07/20 2158  . sodium chloride flush (NS) 0.9 % injection 3 mL  3 mL Intravenous PRN Deetta Perla, MD      . traZODone (DESYREL) tablet 50 mg  50 mg Oral QHS Deetta Perla, MD   50 mg at 07/07/20 2156     Discharge Medications: Please see discharge summary for a list of discharge medications.  Relevant Imaging Results:  Relevant Lab Results:   Additional Information Dreama Saa, RN [phone]  Pete Pelt, RN

## 2020-07-08 NOTE — TOC Initial Note (Addendum)
Transition of Care [facility]) - Initial/Assessment Note    Patient Details  Name: Andrea Noble MRN: [id number] Date of Birth: 04/24/1951  Transition of Care [facility]) CM/SW Contact:    Pete Pelt, RN Phone Number: 07/08/2020, 9:37 AM  Clinical Narrative:        TOC at bedside, introductions made.  Purpose:  Discharge planning.  Patient and spouse amenable.  Patient and spouse spoke to [facility], where she is a resident.  Plans to do short term rehab there.  TOC will facilitate   Update:  Patient is a current resident of Independent Living at [facility], and will be accepted for short term rehab there-patient and spouse amenable.  PASSR request submitted, [id number], FL2 complete.  TOC provided patient and spouse with contact information for any questions or concerns. TOC will follow through discharge.         Expected Discharge Plan: IP Rehab Facility Barriers to Discharge: Continued Medical Work up   Patient Goals and CMS Choice     Choice offered to / list presented to : NA  Expected Discharge Plan and Services Expected Discharge Plan: [place] In-house Referral: NA   Post Acute Care Choice: IP Rehab Living arrangements for the past 2 months: [place]                                      Prior Living Arrangements/Services Living arrangements for the past 2 months: [place] Lives with:: Spouse Patient language and need for interpreter reviewed:: Yes Do you feel safe going back to the place where you live?: Yes      Need for Family Participation in Patient Care: Yes (Comment) Care giver support system in place?: Yes (comment)   Criminal Activity/Legal Involvement Pertinent to Current Situation/Hospitalization: No - Comment as needed  Activities of Daily Living Home Assistive Devices/Equipment: None ADL Screening (condition at time of admission) Patient's cognitive ability adequate to safely complete daily activities?:  Yes Is the patient deaf or have difficulty hearing?: No Does the patient have difficulty seeing, even when wearing glasses/contacts?: No Does the patient have difficulty concentrating, remembering, or making decisions?: No Patient able to express need for assistance with ADLs?: Yes Does the patient have difficulty dressing or bathing?: No Independently performs ADLs?: Yes (appropriate for developmental age) Does the patient have difficulty walking or climbing stairs?: Yes Weakness of Legs: None Weakness of Arms/Hands: None  Permission Sought/Granted   Permission granted to share information with : Yes, Verbal Permission Granted     Permission granted to share info w AGENCY: Seth Bake from [facility]        Emotional Assessment Appearance:: Appears stated age Attitude/Demeanor/Rapport: Gracious,Engaged Affect (typically observed): Accepting,Appropriate,Calm,Pleasant,Quiet Orientation: : Oriented to Self,Oriented to Place,Oriented to  Time,Oriented to Situation Alcohol / Substance Use: Not Applicable Psych Involvement: No (comment)  Admission diagnosis:  Lumbar radiculopathy [M54.16] Patient Active Problem List   Diagnosis Date Noted  . Lumbar radiculopathy 07/06/2020  . History of Hodgkin's lymphoma 03/01/2017   PCP:  Baxter Hire, MD Pharmacy:   [facility], [place] [address] [place] [address] Phone: [phone] Fax: [phone]     Social Determinants of Health (SDOH) Interventions    Readmission Risk Interventions No flowsheet data found.

## 2020-07-09 LAB — RESP PANEL BY RT-PCR (FLU A&B, COVID) ARPGX2
Influenza A by PCR: NEGATIVE
Influenza B by PCR: NEGATIVE
SARS Coronavirus 2 by RT PCR: NEGATIVE

## 2020-07-09 NOTE — Progress Notes (Signed)
Progress Note   Date: 07/09/2020  HPI:Andrea Noble is here for evaluation of ongoing pain in the back and right leg. She says that the back pain has been there for some time and she does have some previous left leg pain but currently the right leg pain started 3 to 6 months ago. She does not remember any inciting event. She does feel that the pain will go down the lateral side of the leg into the calf and sometimes into the foot. She sometimes will get tingling in the foot. She is not experiencing this in the left leg currently. The leg pain is more bothersome than the back pain to her, however they both of limit how much activity she can do including walking. She has been taking gabapentin at nighttime occasionally. She does take NSAIDs. She recently underwent an injection and she felt like she did not get any significant relief from this but also that it may have worsened some bowel and bladder issues that she has been dealing with over the past year. She does state that she has more urinary urgency and will sometimes have stool with urination. She has not had this evaluated previously. 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  Pain is better controlled.  She was able to ambulate yesterday.  3/9: Andrea Noble is doing well with improved pain.  She has not ambulated significantly.  She has not tolerated significant PO.   3/8: [name] is stable with expected back pain but persistent nausea.  She is on Zofran and Phenergan for this.  She is not endorsing any leg pain.  Her strength and sensation is intact.  Her Foley catheter was removed this morning.  Her JP drain had output of 120 cc   Vital Signs: Temp:  [97.8 F (36.6 C)-98.3 F (36.8 C)] 98.2 F (36.8 C) (03/10 0534) Pulse Rate:  [92-102] 93 (03/10 0534) Resp:  [16-18] 18 (03/10 0534) BP: (102-147)/(41-85) 127/85 (03/10 0534) SpO2:  [95 %-100 %] 99  % (03/10 0534) Temp (24hrs), Avg:98.1 F (36.7 C), Min:97.8 F (36.6 C), Max:98.3 F (36.8 C)  Weight: 64 kg  Problem List Patient Active Problem List   Diagnosis Date Noted  . Lumbar radiculopathy 07/06/2020  . History of Hodgkin's lymphoma 03/01/2017    Medications: Scheduled Meds: . acetaminophen  1,000 mg Oral QHS  . buPROPion  200 mg Oral BID  . busPIRone  15 mg Oral BID  . calcium-vitamin D  1 tablet Oral Q breakfast  . darifenacin  15 mg Oral Daily  . diclofenac Sodium  1 application Topical BID  . docusate sodium  100 mg Oral BID  . enoxaparin (LOVENOX) injection  40 mg Subcutaneous Q24H  . estrogen (conjugated)-medroxyprogesterone  1 tablet Oral Daily  . fluticasone  1 spray Each Nare BH-q7a  . gabapentin  200 mg Oral TID  . Ivermectin  1 application Topical Daily  . levothyroxine  50 mcg Oral Once per day on Sun Tue Thu Sat  . levothyroxine  75 mcg Oral Q M,W,F  . lidocaine  1 patch Transdermal Q24H  . liothyronine  2.5 mcg Oral Daily  . melatonin  10 mg Oral QHS  . mometasone-formoterol  2 puff Inhalation BID  . montelukast  10 mg Oral QHS  . multivitamin with minerals  1 tablet Oral Daily  . pantoprazole  40 mg Oral BH-q7a  .  polyethylene glycol  17 g Oral Daily  . pravastatin  80 mg Oral QHS  . psyllium  1 packet Oral QHS  . saccharomyces boulardii  500 mg Oral Daily  . sodium chloride  1 spray Each Nare BID  . sodium chloride flush  3 mL Intravenous Q12H  . traZODone  50 mg Oral QHS   Continuous Infusions: . sodium chloride    . sodium chloride 75 mL/hr at 07/08/20 1828  . methocarbamol (ROBAXIN) IV Stopped (07/06/20 1921)   PRN Meds:.acetaminophen **OR** acetaminophen, albuterol, fluocinonide cream, meclizine, menthol-cetylpyridinium **OR** phenol, methocarbamol **OR** methocarbamol (ROBAXIN) IV, morphine injection, ondansetron **OR** ondansetron (ZOFRAN) IV, oxyCODONE, oxyCODONE, polyvinyl alcohol, sodium chloride flush  Labs:  Lab Results   Component Value Date   WBC 5.3 07/06/2020   WBC 5.0 06/02/2020   HCT 36.8 07/06/2020   HCT 37.8 06/02/2020   PLT 223 07/06/2020   PLT 276 06/02/2020    Lab Results  Component Value Date   INR 1.0 06/02/2020   APTT 28 06/02/2020    Lab Results  Component Value Date   NA 131 (L) 06/02/2020   NA 125 (L) 06/23/2018   K 4.1 06/02/2020   K 3.8 06/23/2018   BUN 9 06/02/2020   BUN 8 06/23/2018   No results found for: MG  Exam: Awake, alert 5-5 strength in bilateral hip flexion, knee flexion, knee extension, dorsiflexion, plantarflexion Sensation intact in bilateral lower extremities Dressings with mild saturation, JP bulb in place   Imaging Lumbar spine x-rays reviewed - good placement   Assessment/plan Andrea Noble is postop day 3 from L4-S1 pedicle screw fixation, L4-5 TLIF  - I removed the drain - I changed dressings -Lovenox for DVT prophylaxis -Pain control -encourage PO intake -mobilize  Meade Maw, [place]

## 2020-07-09 NOTE — Discharge Summary (Addendum)
Physician Discharge Summary  Patient ID: Andrea Noble MRN: [id number] DOB/AGE: 12-04-1950 70 y.o.  Admit date: 07/06/2020 Discharge date: 07/09/2020  Admission Diagnoses: lumbar radiculopathy  Discharge Diagnoses:  Active Problems:   Lumbar radiculopathy   Discharged Condition: good  Hospital Course:  Andrea Noble was admitted for elective surgical intervention.  She did well with surgery, but pain limited her ambulation until POD2.  After ambulating well, she was felt to be a good candidate for discharge on POD3.  Consults: None  Significant Diagnostic Studies: radiology: X-Ray: Good placement of implants  Treatments: surgery: lumbar spine surgery  Discharge Exam: Blood pressure 127/85, pulse 93, temperature 98.2 F (36.8 C), resp. rate 18, height 5\' 4"  (1.626 m), weight 64 kg, SpO2 99 %. General appearance: alert and cooperative  CNI 5/5 throughout  Disposition: Skilled nursing facility  Discharge Instructions    Discharge patient   Complete by: As directed    Discharge disposition: 01-Home or Self Care   Discharge patient date: 07/09/2020   Incentive spirometry RT   Complete by: As directed      Allergies as of 07/09/2020      Reactions   Levofloxacin    IV Levaquin causes burning   Other Itching   Surgical glue      Medication List    TAKE these medications   acetaminophen 650 MG CR tablet Commonly known as: TYLENOL Take 1,300 mg by mouth at bedtime.   Advair Diskus 100-50 MCG/DOSE Aepb Generic drug: Fluticasone-Salmeterol Inhale 1 puff into the lungs in the morning and at bedtime.   albuterol 108 (90 Base) MCG/ACT inhaler Commonly known as: VENTOLIN HFA Inhale 2 puffs into the lungs every 4 (four) hours as needed for wheezing or shortness of breath.   buPROPion 200 MG 12 hr tablet Commonly known as: WELLBUTRIN SR Take 200 mg by mouth 2 (two) times daily.   busPIRone 15 MG tablet Commonly known as: BUSPAR Take 15 mg by mouth 2 (two) times  daily.   CALTRATE MINIS PLUS MINERALS PO Take 40 mcg by mouth daily.   chlorpheniramine 4 MG tablet Commonly known as: CHLOR-TRIMETON Take 4 mg by mouth 3 (three) times daily.   Citrucel oral powder Generic drug: methylcellulose Take 1 Scoop by mouth at bedtime.   CRANBERRY PO Take 4,200 mg by mouth daily at 12 noon.   dextromethorphan-guaiFENesin 30-600 MG 12hr tablet Commonly known as: MUCINEX DM Take 1 tablet by mouth daily.   diclofenac Sodium 1 % Gel Commonly known as: VOLTAREN Apply 1 application topically 2 (two) times daily.   diflorasone-emollient 0.05 % Crea Commonly known as: APEXICON E Apply 1 application topically daily as needed (Eczema).   docusate sodium 100 MG capsule Commonly known as: COLACE Take 100 mg by mouth 2 (two) times daily.   fluocinonide cream 0.05 % Commonly known as: LIDEX Apply 1 application topically daily as needed (Eczema).   fluticasone 50 MCG/ACT nasal spray Commonly known as: FLONASE Place 1 spray into both nostrils every morning.   gabapentin 100 MG capsule Commonly known as: NEURONTIN Take 200 mg by mouth 3 (three) times daily.   Ivermectin 1 % Crea Apply 1 application topically daily. On face   levothyroxine 50 MCG tablet Commonly known as: SYNTHROID Take 50-75 mcg by mouth See admin instructions. Take 75 mg every  Monday, Wednesday and Friday Take 50 mg all the other days   liothyronine 5 MCG tablet Commonly known as: CYTOMEL Take 2.5 mcg by mouth daily.   meclizine 25  MG tablet Commonly known as: ANTIVERT Take 25 mg by mouth daily as needed (Dizzyness).   Melatonin 10 MG Tabs Take 10 mg by mouth at bedtime. Timed-Release   methocarbamol 500 MG tablet Commonly known as: ROBAXIN Take 1 tablet (500 mg total) by mouth every 8 (eight) hours as needed for muscle spasms.   montelukast 10 MG tablet Commonly known as: SINGULAIR Take 10 mg by mouth at bedtime.   Move Free Ultra Joint Health 40-5-3.3 MG  Tabs Generic drug: Collagen-Boron-Hyaluronic Acid Take 1 tablet by mouth every evening.   multivitamin tablet Take 1 tablet by mouth daily. Vita Fusion adult Gummie   ondansetron 4 MG tablet Commonly known as: ZOFRAN Take 1 tablet (4 mg total) by mouth every 8 (eight) hours as needed for nausea or vomiting.   oxyCODONE 5 MG immediate release tablet Commonly known as: Oxy IR/ROXICODONE Take 1 tablet (5 mg total) by mouth every 6 (six) hours as needed for moderate pain or severe pain.   pantoprazole 40 MG tablet Commonly known as: PROTONIX Take 40 mg by mouth every morning.   [facility] Colon Health Caps Take 1 capsule by mouth at bedtime.   polyethylene glycol powder 17 GM/SCOOP powder Commonly known as: GLYCOLAX/MIRALAX Take 17 g by mouth daily.   pravastatin 40 MG tablet Commonly known as: PRAVACHOL Take 80 mg by mouth daily.   Prempro 0.45-1.5 MG tablet Generic drug: estrogen (conjugated)-medroxyprogesterone Take 1 tablet by mouth daily.   saccharomyces boulardii 250 MG capsule Commonly known as: FLORASTOR Take 500 mg by mouth daily.   sodium chloride 0.65 % Soln nasal spray Commonly known as: OCEAN Place 1 spray into both nostrils 2 (two) times daily.   solifenacin 10 MG tablet Commonly known as: VESICARE Take 10 mg by mouth daily. 1530   Systane 0.4-0.3 % Gel ophthalmic gel Generic drug: Polyethyl Glycol-Propyl Glycol Place 1 application into both eyes at bedtime.   Theratears 0.25 % Soln Generic drug: Carboxymethylcellulose Sodium Place 1 drop into both eyes daily as needed (Dry eyes).   traZODone 50 MG tablet Commonly known as: DESYREL Take 50 mg by mouth at bedtime.       Follow-up Information    Deetta Perla, MD Follow up in 2 week(s).   Specialty: Neurosurgery Why: previously scheduled Contact information: [place] [address] [phone]               Signed: Meade Maw 07/09/2020, 7:28 AM

## 2020-07-09 NOTE — TOC Transition Note (Signed)
Transition of Care [facility]) - CM/SW Discharge Note   Patient Details  Name: Andrea Noble MRN: [id number] Date of Birth: 04-04-1951  Transition of Care [facility]) CM/SW Contact:  Pete Pelt, RN Phone Number: 07/09/2020, 8:51 AM   Clinical Narrative:  Pt being discharged to [facility] today, Room 102, patient requested EMS to transport and amenable to discharge. Rapid COVID test ordered, will await results prior to transport.  RNCM will complete EMS packet and call for transport once ready.      Final next level of care: IP Rehab Facility Barriers to Discharge: Continued Medical Work up   Patient Goals and CMS Choice     Choice offered to / list presented to : NA  Discharge Placement                       Discharge Plan and Services In-house Referral: NA   Post Acute Care Choice: IP Rehab                               Social Determinants of Health (SDOH) Interventions     Readmission Risk Interventions No flowsheet data found.

## 2020-07-09 NOTE — Care Management Important Message (Signed)
Important Message  Patient Details  Name: Andrea Noble MRN: [id number] Date of Birth: October 01, 1950   Medicare Important Message Given:  N/A - LOS <3 / Initial given by admissions     Juliann Pulse A Joelle [place] 07/09/2020, 10:37 AM

## 2020-07-10 DIAGNOSIS — M5416 Radiculopathy, lumbar region: Secondary | ICD-10-CM

## 2020-07-10 DIAGNOSIS — Z9889 Other specified postprocedural states: Secondary | ICD-10-CM

## 2020-07-13 DIAGNOSIS — J309 Allergic rhinitis, unspecified: Secondary | ICD-10-CM

## 2020-07-13 DIAGNOSIS — J45909 Unspecified asthma, uncomplicated: Secondary | ICD-10-CM

## 2020-07-13 DIAGNOSIS — M5416 Radiculopathy, lumbar region: Secondary | ICD-10-CM

## 2020-07-13 DIAGNOSIS — F39 Unspecified mood [affective] disorder: Secondary | ICD-10-CM

## 2020-07-13 DIAGNOSIS — N3281 Overactive bladder: Secondary | ICD-10-CM

## 2020-07-13 DIAGNOSIS — I471 Supraventricular tachycardia: Secondary | ICD-10-CM

## 2020-07-21 ENCOUNTER — Encounter: Payer: Self-pay | Admitting: Internal Medicine

## 2020-07-21 ENCOUNTER — Other Ambulatory Visit: Payer: Self-pay | Admitting: Internal Medicine

## 2020-07-21 DIAGNOSIS — I471 Supraventricular tachycardia, unspecified: Secondary | ICD-10-CM | POA: Insufficient documentation

## 2020-07-21 DIAGNOSIS — N3281 Overactive bladder: Secondary | ICD-10-CM | POA: Insufficient documentation

## 2020-07-21 DIAGNOSIS — N951 Menopausal and female climacteric states: Secondary | ICD-10-CM | POA: Insufficient documentation

## 2020-07-21 DIAGNOSIS — J302 Other seasonal allergic rhinitis: Secondary | ICD-10-CM | POA: Insufficient documentation

## 2020-07-21 DIAGNOSIS — E785 Hyperlipidemia, unspecified: Secondary | ICD-10-CM | POA: Insufficient documentation

## 2020-07-31 ENCOUNTER — Encounter: Payer: Self-pay | Admitting: Internal Medicine

## 2020-07-31 ENCOUNTER — Other Ambulatory Visit: Payer: Self-pay

## 2020-07-31 ENCOUNTER — Ambulatory Visit (INDEPENDENT_AMBULATORY_CARE_PROVIDER_SITE_OTHER): Payer: Medicare Other | Admitting: Internal Medicine

## 2020-07-31 VITALS — BP 120/80 | HR 106 | Temp 97.5°F | Ht 64.5 in | Wt 141.0 lb

## 2020-07-31 DIAGNOSIS — N951 Menopausal and female climacteric states: Secondary | ICD-10-CM

## 2020-07-31 DIAGNOSIS — J4520 Mild intermittent asthma, uncomplicated: Secondary | ICD-10-CM | POA: Insufficient documentation

## 2020-07-31 DIAGNOSIS — N3281 Overactive bladder: Secondary | ICD-10-CM

## 2020-07-31 DIAGNOSIS — J302 Other seasonal allergic rhinitis: Secondary | ICD-10-CM | POA: Diagnosis not present

## 2020-07-31 DIAGNOSIS — M5416 Radiculopathy, lumbar region: Secondary | ICD-10-CM | POA: Diagnosis not present

## 2020-07-31 NOTE — Assessment & Plan Note (Signed)
Discussed the chlorpheniramine and second generation antihistamines She will have to experiment with what works best

## 2020-07-31 NOTE — Progress Notes (Signed)
Subjective:    Patient ID: Andrea Noble, female    DOB: 1950-10-04, 70 y.o.   MRN: [id number]  HPI Here for follow up after surgery and rehab This visit occurred during the SARS-CoV-2 public health emergency.  Safety protocols were in place, including screening questions prior to the visit, additional usage of staff PPE, and extensive cleaning of exam room while observing appropriate contact time as indicated for disinfecting solutions.   With husband Transition home is still in progress Pain is still present--but "manageable" Hasn't used the oxycodone or the muscle relaxer Wearing brace all the time other than bed Still with OT Is on the gabapentin 200mg  tid  Is able to shower--independent  Husband doing the housework for now Does have some "tiredness"  Satisfied with the vesicare No apparent side effects --other than dry mouth  Chronic mood issues Does okay with the bupropion and buspirone  Current Outpatient Medications on File Prior to Visit  Medication Sig Dispense Refill  . acetaminophen (TYLENOL) 650 MG CR tablet Take 1,300 mg by mouth at bedtime.    Marland Kitchen ADVAIR DISKUS 100-50 MCG/DOSE AEPB Inhale 1 puff into the lungs in the morning and at bedtime.    Marland Kitchen albuterol (VENTOLIN HFA) 108 (90 Base) MCG/ACT inhaler Inhale 2 puffs into the lungs every 4 (four) hours as needed for wheezing or shortness of breath.    Marland Kitchen buPROPion (WELLBUTRIN SR) 200 MG 12 hr tablet Take 200 mg by mouth 2 (two) times daily.    . busPIRone (BUSPAR) 15 MG tablet Take 15 mg by mouth 2 (two) times daily.    . Calcium Carbonate-Vit D-Min (CALTRATE MINIS PLUS MINERALS PO) Take 40 mcg by mouth daily.    . Carboxymethylcellulose Sodium (THERATEARS) 0.25 % SOLN Place 1 drop into both eyes daily as needed (Dry eyes).    . Collagen-Boron-Hyaluronic Acid (MOVE FREE ULTRA JOINT HEALTH) 40-5-3.3 MG TABS Take 1 tablet by mouth every evening.    Marland Kitchen CRANBERRY PO Take 4,200 mg by mouth daily at 12 noon.    . diclofenac  Sodium (VOLTAREN) 1 % GEL Apply 1 application topically 2 (two) times daily.    . diflorasone-emollient (APEXICON E) 0.05 % CREA Apply 1 application topically daily as needed (Eczema).    . docusate sodium (COLACE) 100 MG capsule Take 100 mg by mouth 2 (two) times daily.    . fluocinonide cream (LIDEX) 3.82 % Apply 1 application topically daily as needed (Eczema).    . fluticasone (FLONASE) 50 MCG/ACT nasal spray Place 1 spray into both nostrils every morning.    . gabapentin (NEURONTIN) 100 MG capsule Take 200 mg by mouth 3 (three) times daily.    . Ivermectin 1 % CREA Apply 1 application topically daily. On face    . levothyroxine (SYNTHROID, LEVOTHROID) 50 MCG tablet Take 50-75 mcg by mouth See admin instructions. Take 75 mg every  Monday, Wednesday and Friday Take 50 mg all the other days    . liothyronine (CYTOMEL) 5 MCG tablet Take 2.5 mcg by mouth daily.    . meclizine (ANTIVERT) 25 MG tablet Take 25 mg by mouth daily as needed (Dizzyness).    . Melatonin 10 MG TABS Take 10 mg by mouth at bedtime. Timed-Release    . methylcellulose (CITRUCEL) oral powder Take 1 Scoop by mouth at bedtime.    . montelukast (SINGULAIR) 10 MG tablet Take 10 mg by mouth at bedtime.    . Multiple Vitamin (MULTIVITAMIN) tablet Take 1 tablet by mouth daily.  Vita Fusion adult Gummie    . ondansetron (ZOFRAN) 4 MG tablet Take 1 tablet (4 mg total) by mouth every 8 (eight) hours as needed for nausea or vomiting. 20 tablet 0  . pantoprazole (PROTONIX) 40 MG tablet Take 40 mg by mouth every morning.    Vladimir Faster Glycol-Propyl Glycol (SYSTANE) 0.4-0.3 % GEL ophthalmic gel Place 1 application into both eyes at bedtime.    . polyethylene glycol powder (GLYCOLAX/MIRALAX) 17 GM/SCOOP powder Take 17 g by mouth daily.    . pravastatin (PRAVACHOL) 40 MG tablet Take 80 mg by mouth daily.    Marland Kitchen PREMPRO 0.45-1.5 MG tablet Take 1 tablet by mouth daily.    . Probiotic Product ([place]) CAPS Take 1 capsule by mouth  at bedtime.    . saccharomyces boulardii (FLORASTOR) 250 MG capsule Take 500 mg by mouth daily.    . sodium chloride (OCEAN) 0.65 % SOLN nasal spray Place 1 spray into both nostrils 2 (two) times daily.    . solifenacin (VESICARE) 10 MG tablet Take 10 mg by mouth daily. 1530    . traZODone (DESYREL) 50 MG tablet Take 50 mg by mouth at bedtime.    Marland Kitchen dextromethorphan-guaiFENesin (MUCINEX DM) 30-600 MG 12hr tablet Take 1 tablet by mouth daily. (Patient not taking: Reported on 07/31/2020)    . methocarbamol (ROBAXIN) 500 MG tablet Take 1 tablet (500 mg total) by mouth every 8 (eight) hours as needed for muscle spasms. (Patient not taking: Reported on 07/31/2020) 60 tablet 1  . oxyCODONE (OXY IR/ROXICODONE) 5 MG immediate release tablet Take 1 tablet (5 mg total) by mouth every 6 (six) hours as needed for moderate pain or severe pain. (Patient not taking: Reported on 07/31/2020) 40 tablet 0   No current facility-administered medications on file prior to visit.    Allergies  Allergen Reactions  . Levofloxacin     IV Levaquin causes burning  . Other Itching    Surgical glue    Past Medical History:  Diagnosis Date  . Anxiety   . Arthritis   . Asthma   . Cataract   . Depression   . Diverticulitis   . GERD (gastroesophageal reflux disease)   . Hodgkin's lymphoma ([place])   . Hyperlipidemia   . Hypothyroidism   . Menopausal syndrome   . OAB (overactive bladder)   . Pneumonia   . Seasonal allergies   . SVT (supraventricular tachycardia) ([place])   . Thyroid disease    hypothyroid    Past Surgical History:  Procedure Laterality Date  . BLEPHAROPLASTY Bilateral   . BREAST BIOPSY     benign  . BREAST BIOPSY  07/27/2018   [place] core bx venus COLUMNAR CELL METAPLASIA, AND PSEUDOANGIOMATOUS STROMAL  . CARPAL TUNNEL RELEASE Right   . CARPAL TUNNEL RELEASE Left   . CATARACT EXTRACTION Bilateral 2015  . CLEFT LIP REPAIR    . COLONOSCOPY WITH PROPOFOL N/A 07/09/2018   Procedure: COLONOSCOPY WITH  PROPOFOL;  Surgeon: Lollie Sails, MD;  Location: [facility]COPY;  Service: Endoscopy;  Laterality: N/A;  . DE QUERVAIN'S RELEASE Bilateral   . DIAGNOSTIC LAPAROSCOPY     endometriosis  . ESOPHAGOGASTRODUODENOSCOPY N/A 07/09/2018   Procedure: ESOPHAGOGASTRODUODENOSCOPY (EGD);  Surgeon: Lollie Sails, MD;  Location: [facility] ENDOSCOPY;  Service: Endoscopy;  Laterality: N/A;  . FINGER SURGERY Right    Middle finger joint replaced  . MAXIMUM ACCESS (MAS) TRANSFORAMINAL LUMBAR INTERBODY FUSION (TLIF) 2 LEVEL Right 07/06/2020   Procedure: L4/5, L5/S1 TRANSFORAMINAL  LUMBAR INTERBODY FUSION (TLIF), L4-S1 PEDICLE SCREWS,DECOMPRESSION ;  Surgeon: Deetta Perla, MD;  Location: [facility] ORS;  Service: Neurosurgery;  Laterality: Right;  . THUMB SURGERY Right    took tendon from forearm and wrapped it around the thumb joint    Family History  Problem Relation Age of Onset  . Breast cancer Maternal Aunt   . Anuerysm Mother   . Stroke Mother   . Heart attack Father   . Alzheimer's disease Father   . Developmental delay Brother     Social History   Socioeconomic History  . Marital status: Married    Spouse name: Orpah Greek  . Number of children: 0  . Years of education: Not on file  . Highest education level: Not on file  Occupational History  . Occupation: Social work/chaplain    Comment: 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 [place] Use  . Smoking status: Never Smoker  . Smokeless tobacco: Never Used  Vaping Use  . Vaping Use: Never used  Substance and Sexual Activity  . Alcohol use: Yes    Comment: very occasional  . Drug use: No  . Sexual activity: Not on file  Other Topics Concern  . Not on file  Social History Narrative   Has living will   Husband is health care POA   Would accept resuscitation attempts   No feeding tube if cognitively unaware   Social Determinants of Health   Financial Resource Strain: Not on file  Food Insecurity: Not on file  Transportation Needs: Not on file   Physical Activity: Not on file  Stress: Not on file  Social Connections: Not on file  Intimate Partner Violence: Not on file   Review of Systems Sleeping fair with the trazodone and melatonin Appetite is good Didn't tolerate weaning estrogen Rx (and uses premarin vaginal cream twice a week) Still with thick sinus drainage Dry skin    Objective:   Physical Exam Constitutional:      Appearance: Normal appearance.  Cardiovascular:     Rate and Rhythm: Normal rate and regular rhythm.     Heart sounds: No murmur heard. No gallop.   Pulmonary:     Effort: Pulmonary effort is normal.     Breath sounds: Normal breath sounds. No wheezing or rales.  Musculoskeletal:     Cervical back: Neck supple.     Right lower leg: No edema.     Left lower leg: No edema.     Comments: Still with back brace  Lymphadenopathy:     Cervical: No cervical adenopathy.  Neurological:     Mental Status: She is alert.  Psychiatric:        Mood and Affect: Mood normal.        Behavior: Behavior normal.            Assessment & Plan:

## 2020-07-31 NOTE — Assessment & Plan Note (Signed)
Still recovering from the surgery Discussed weaning the daytime gabapentin Not really needing the oxycodone Could use muscle relaxant at night if needed Continues with OT

## 2020-07-31 NOTE — Assessment & Plan Note (Signed)
vesicare helps Will continue it for now

## 2020-07-31 NOTE — Assessment & Plan Note (Signed)
Has allergy problems but asthma controlled with the advair

## 2020-07-31 NOTE — Patient Instructions (Signed)
You can try to decrease or even stop the daytime doses of gabapentin ----you can continue or even increase it at night to help you sleep.

## 2020-07-31 NOTE — Assessment & Plan Note (Signed)
Does okay with the prempro

## 2020-10-07 ENCOUNTER — Telehealth: Payer: Self-pay

## 2020-10-07 MED ORDER — TRAZODONE HCL 50 MG PO TABS: 50.0000 mg | ORAL_TABLET | Freq: Every day | ORAL | 1 refills | Status: DC

## 2020-10-07 MED ORDER — BUPROPION HCL ER (SR) 200 MG PO TB12: 200.0000 mg | ORAL_TABLET | Freq: Two times a day (BID) | ORAL | 1 refills | Status: DC

## 2020-10-07 NOTE — Telephone Encounter (Signed)
Rx sent electronically.  

## 2021-02-03 ENCOUNTER — Ambulatory Visit (INDEPENDENT_AMBULATORY_CARE_PROVIDER_SITE_OTHER): Payer: Medicare Other | Admitting: Internal Medicine

## 2021-02-03 ENCOUNTER — Other Ambulatory Visit: Payer: Self-pay | Admitting: Internal Medicine

## 2021-02-03 ENCOUNTER — Encounter: Payer: Self-pay | Admitting: Internal Medicine

## 2021-02-03 ENCOUNTER — Other Ambulatory Visit: Payer: Self-pay

## 2021-02-03 VITALS — BP 110/64 | HR 106 | Temp 97.2°F | Ht 64.0 in | Wt 141.0 lb

## 2021-02-03 DIAGNOSIS — N3281 Overactive bladder: Secondary | ICD-10-CM

## 2021-02-03 DIAGNOSIS — F39 Unspecified mood [affective] disorder: Secondary | ICD-10-CM

## 2021-02-03 DIAGNOSIS — Z1231 Encounter for screening mammogram for malignant neoplasm of breast: Secondary | ICD-10-CM

## 2021-02-03 DIAGNOSIS — I471 Supraventricular tachycardia: Secondary | ICD-10-CM | POA: Diagnosis not present

## 2021-02-03 DIAGNOSIS — J4520 Mild intermittent asthma, uncomplicated: Secondary | ICD-10-CM

## 2021-02-03 DIAGNOSIS — Z0000 Encounter for general adult medical examination without abnormal findings: Secondary | ICD-10-CM | POA: Diagnosis not present

## 2021-02-03 DIAGNOSIS — I700 Atherosclerosis of aorta: Secondary | ICD-10-CM

## 2021-02-03 NOTE — Assessment & Plan Note (Signed)
I have personally reviewed the Medicare Annual Wellness questionnaire and have noted 1. The patient's medical and social history 2. Their use of alcohol, tobacco or illicit drugs 3. Their current medications and supplements 4. The patient's functional ability including ADL's, fall risks, home safety risks and hearing or visual             impairment. 5. Diet and physical activities 6. Evidence for depression or mood disorders  The patients weight, height, BMI and visual acuity have been recorded in the chart I have made referrals, counseling and provided education to the patient based review of the above and I have provided the pt with a written personalized care plan for preventive services.  I have provided you with a copy of your personalized plan for preventive services. Please take the time to review along with your updated medication list.  Colon due 2025 Gets yearly mammograms in November Just had flu vaccine and bivalent COVID Discussed exercise--especially resistance/core

## 2021-02-03 NOTE — Assessment & Plan Note (Signed)
Doing okay on advair and singulair

## 2021-02-03 NOTE — Assessment & Plan Note (Signed)
On imaging Is on pravastatin

## 2021-02-03 NOTE — Assessment & Plan Note (Signed)
Has dysthymia and reactive changes Regular anxiety On bupropion and buspirone Will proceed with referral for counseling

## 2021-02-03 NOTE — Assessment & Plan Note (Signed)
No recurrence per symptoms No Rx

## 2021-02-03 NOTE — Assessment & Plan Note (Signed)
Uses the solefinacin every other day

## 2021-02-03 NOTE — Progress Notes (Signed)
Subjective:    Patient ID: Andrea Noble, female    DOB: 03/01/1951, 70 y.o.   MRN: [id number]  HPI Here for Medicare wellness visit and follow up of chronic health conditions This visit occurred during the SARS-CoV-2 public health emergency.  Safety protocols were in place, including screening questions prior to the visit, additional usage of staff PPE, and extensive cleaning of exam room while observing appropriate contact time as indicated for disinfecting solutions.   Reviewed advanced directives Reviewed other doctors---Dr Patel--rheumatology, Dr Cook--neurosurgery, Dr Casimiro Needle, Dr Georgina Peer, Dr Nice--optometry, Dr Bobbie Stack, Chana Bode, Dr Jearld Adjutant Had lumbar fusion in March 2022--no other hospitalizations or surgery Did fall over backwards trying to pick something up--no injury  Feels close to back to normal since the back surgery Trying to exercise regularly  Having stress Thinks her step mother will need assisted living--they are back and forth to  [facility] for her Some depression---"cumulative" with grieving for dad, surgery, etc Is back to choir practice and church Continues to be anxious ---"low startle reflex" Still trying to figure out what activities/volunteering she wants to do Did see psychiatrist in the past--felt to have OCD Does have every 2 week housekeepers  No palpitations Heart rate is down No chest pain Slight dizziness  but no syncope No edema  Asthma controlled Takes the montelukast and advair Not needing the albuterol Occasional cough---with expiratory noise No SOB  Is taking the solifenacin every other day--to help dry mouth This does help her incontinence  Current Outpatient Medications on File Prior to Visit  Medication Sig Dispense Refill   acetaminophen (TYLENOL) 650 MG CR tablet Take 1,300 mg by mouth at bedtime.     ADVAIR DISKUS 100-50 MCG/DOSE AEPB Inhale 1 puff into the lungs in the  morning and at bedtime.     albuterol (VENTOLIN HFA) 108 (90 Base) MCG/ACT inhaler Inhale 2 puffs into the lungs every 4 (four) hours as needed for wheezing or shortness of breath.     buPROPion (WELLBUTRIN SR) 200 MG 12 hr tablet Take 1 tablet (200 mg total) by mouth 2 (two) times daily. 180 tablet 1   busPIRone (BUSPAR) 15 MG tablet Take 15 mg by mouth 2 (two) times daily.     Calcium Carbonate-Vit D-Min (CALTRATE MINIS PLUS MINERALS PO) Take 40 mcg by mouth daily.     Carboxymethylcellulose Sodium (THERATEARS) 0.25 % SOLN Place 1 drop into both eyes daily as needed (Dry eyes).     Collagen-Boron-Hyaluronic Acid (MOVE FREE ULTRA JOINT HEALTH) 40-5-3.3 MG TABS Take 1 tablet by mouth every evening.     CRANBERRY PO Take 4,200 mg by mouth daily at 12 noon.     diclofenac Sodium (VOLTAREN) 1 % GEL Apply 1 application topically 2 (two) times daily.     diflorasone-emollient (APEXICON E) 0.05 % CREA Apply 1 application topically daily as needed (Eczema).     docusate sodium (COLACE) 100 MG capsule Take 100 mg by mouth 2 (two) times daily.     fluocinonide cream (LIDEX) 2.44 % Apply 1 application topically daily as needed (Eczema).     fluticasone (FLONASE) 50 MCG/ACT nasal spray Place 1 spray into both nostrils every morning.     gabapentin (NEURONTIN) 100 MG capsule Take 200 mg by mouth 3 (three) times daily.     Ivermectin 1 % CREA Apply 1 application topically daily. On face     levothyroxine (SYNTHROID, LEVOTHROID) 50 MCG tablet Take 50-75 mcg by mouth See admin instructions. Take 75 mg every  Monday, Wednesday and Friday Take 50 mg all the other days     liothyronine (CYTOMEL) 5 MCG tablet Take 2.5 mcg by mouth daily.     meclizine (ANTIVERT) 25 MG tablet Take 25 mg by mouth daily as needed (Dizzyness).     Melatonin 10 MG TABS Take 10 mg by mouth at bedtime. Timed-Release     methocarbamol (ROBAXIN) 500 MG tablet Take 1 tablet (500 mg total) by mouth every 8 (eight) hours as needed for muscle  spasms. 60 tablet 1   methylcellulose (CITRUCEL) oral powder Take 1 Scoop by mouth at bedtime.     montelukast (SINGULAIR) 10 MG tablet Take 10 mg by mouth at bedtime.     Multiple Vitamin (MULTIVITAMIN) tablet Take 1 tablet by mouth daily. Vita Fusion adult Gummie     ondansetron (ZOFRAN) 4 MG tablet Take 1 tablet (4 mg total) by mouth every 8 (eight) hours as needed for nausea or vomiting. 20 tablet 0   pantoprazole (PROTONIX) 40 MG tablet Take 40 mg by mouth every morning.     Polyethyl Glycol-Propyl Glycol (SYSTANE) 0.4-0.3 % GEL ophthalmic gel Place 1 application into both eyes at bedtime.     polyethylene glycol powder (GLYCOLAX/MIRALAX) 17 GM/SCOOP powder Take 17 g by mouth daily.     pravastatin (PRAVACHOL) 40 MG tablet Take 80 mg by mouth daily.     PREMPRO 0.45-1.5 MG tablet Take 1 tablet by mouth daily.     Probiotic Product ([place]) CAPS Take 1 capsule by mouth at bedtime.     saccharomyces boulardii (FLORASTOR) 250 MG capsule Take 500 mg by mouth daily.     sodium chloride (OCEAN) 0.65 % SOLN nasal spray Place 1 spray into both nostrils 2 (two) times daily.     solifenacin (VESICARE) 10 MG tablet Take 10 mg by mouth every other day. 1530     traZODone (DESYREL) 50 MG tablet Take 1 tablet (50 mg total) by mouth at bedtime. 90 tablet 1   No current facility-administered medications on file prior to visit.    Allergies  Allergen Reactions   Levofloxacin     IV Levaquin causes burning   Other Itching    Surgical glue    Past Medical History:  Diagnosis Date   Anxiety    Arthritis    Asthma    Cataract    Depression    Diverticulitis    GERD (gastroesophageal reflux disease)    Hodgkin's lymphoma ([facility])    Hyperlipidemia    Hypothyroidism    Menopausal syndrome    OAB (overactive bladder)    Pneumonia    Seasonal allergies    SVT (supraventricular tachycardia) ([facility])    Thyroid disease    hypothyroid    Past Surgical History:  Procedure Laterality  Date   BLEPHAROPLASTY Bilateral    BREAST BIOPSY     benign   BREAST BIOPSY  07/27/2018   [place] core bx venus COLUMNAR CELL METAPLASIA, AND PSEUDOANGIOMATOUS STROMAL   CARPAL TUNNEL RELEASE Right    CARPAL TUNNEL RELEASE Left    CATARACT EXTRACTION Bilateral 2015   CLEFT LIP REPAIR     COLONOSCOPY WITH PROPOFOL N/A 07/09/2018   Procedure: COLONOSCOPY WITH PROPOFOL;  Surgeon: Lollie Sails, MD;  Location: [facility] ENDOSCOPY;  Service: Endoscopy;  Laterality: N/A;   DE QUERVAIN'S RELEASE Bilateral    DIAGNOSTIC LAPAROSCOPY     endometriosis   ESOPHAGOGASTRODUODENOSCOPY N/A 07/09/2018   Procedure: ESOPHAGOGASTRODUODENOSCOPY (EGD);  Surgeon: Lollie Sails,  MD;  Location: [facility] ENDOSCOPY;  Service: Endoscopy;  Laterality: N/A;   FINGER SURGERY Right    Middle finger joint replaced   MAXIMUM ACCESS (MAS) TRANSFORAMINAL LUMBAR INTERBODY FUSION (TLIF) 2 LEVEL Right 07/06/2020   Procedure: L4/5, L5/S1 TRANSFORAMINAL LUMBAR INTERBODY FUSION (TLIF), L4-S1 PEDICLE SCREWS,DECOMPRESSION ;  Surgeon: Deetta Perla, MD;  Location: [facility] ORS;  Service: Neurosurgery;  Laterality: Right;   THUMB SURGERY Right    took tendon from forearm and wrapped it around the thumb joint    Family History  Problem Relation Age of Onset   Breast cancer Maternal Aunt    Anuerysm Mother    Stroke Mother    Heart attack Father    Alzheimer's disease Father    Developmental delay Brother     Social History   Socioeconomic History   Marital status: Married    Spouse name: Dwight   Number of children: 0   Years of education: Not on file   Highest education level: Not on file  Occupational History   Occupation: Social work/chaplain    Comment: 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 [place]  Tobacco Use   Smoking status: Never   Smokeless tobacco: Never  Vaping Use   Vaping Use: Never used  Substance and Sexual Activity   Alcohol use: Yes    Comment: very occasional   Drug use: No   Sexual activity: Not on file  Other Topics  Concern   Not on file  Social History Narrative   Has living will   Husband is health care POA   Would accept resuscitation attempts   No feeding tube if cognitively unaware   Social Determinants of Health   Financial Resource Strain: Not on file  Food Insecurity: Not on file  Transportation Needs: Not on file  Physical Activity: Not on file  Stress: Not on file  Social Connections: Not on file  Intimate Partner Violence: Not on file   Review of Systems Sleeps variably--but well at times Appetite is fine Weight is down some Wears seat belt Teeth are softer---sees dentist Yellow nasal drainage---seems to be allergy related. Uses loratadine/flonase No suspicious skin lesions---does have healing bruise on left upper calf Just had thyroid checked [facility] at [facility] are okay----no blood Ongoing joint pains---does see rheumatologist--especiallly sore in right hip now (uses topical voltaren)    Objective:   Physical Exam Constitutional:      Appearance: Normal appearance.  HENT:     Mouth/Throat:     Comments: No lesions Eyes:     Conjunctiva/sclera: Conjunctivae normal.     Pupils: Pupils are equal, round, and reactive to light.  Cardiovascular:     Rate and Rhythm: Normal rate and regular rhythm.     Pulses: Normal pulses.     Heart sounds: No murmur heard.   No gallop.  Pulmonary:     Effort: Pulmonary effort is normal.     Breath sounds: Normal breath sounds. No wheezing or rales.  Abdominal:     Palpations: Abdomen is soft.     Tenderness: There is no abdominal tenderness.  Musculoskeletal:     Cervical back: Neck supple.     Right lower leg: No edema.     Left lower leg: No edema.     Comments: Tenderness over right hip bursa. ROM normal (discussed ice, etc)  Lymphadenopathy:     Cervical: No cervical adenopathy.  Skin:    Findings: No lesion or rash.  Neurological:     Mental Status: She is alert  and oriented to person, place, and time.      Comments: President--"Joseph Biden, Barack Obama-----Donald Trump" [id number] Recall 3/3  Psychiatric:        Behavior: Behavior normal.     Comments: Mild anxiety           Assessment & Plan:

## 2021-03-24 DIAGNOSIS — I10 Essential (primary) hypertension: Secondary | ICD-10-CM | POA: Insufficient documentation

## 2021-04-01 ENCOUNTER — Ambulatory Visit
Admission: RE | Admit: 2021-04-01 | Discharge: 2021-04-01 | Disposition: A | Discharge status: Home / Self Care | Payer: Medicare Other | Source: Ambulatory Visit | Attending: Internal Medicine | Admitting: Internal Medicine

## 2021-04-01 ENCOUNTER — Other Ambulatory Visit: Payer: Self-pay

## 2021-04-01 DIAGNOSIS — Z1231 Encounter for screening mammogram for malignant neoplasm of breast: Secondary | ICD-10-CM | POA: Insufficient documentation

## 2021-04-05 ENCOUNTER — Other Ambulatory Visit: Payer: Self-pay | Admitting: Internal Medicine

## 2021-04-21 ENCOUNTER — Telehealth: Payer: Self-pay | Admitting: Internal Medicine

## 2021-04-21 MED ORDER — PRAVASTATIN SODIUM 40 MG PO TABS: 80.0000 mg | ORAL_TABLET | Freq: Every day | ORAL | 3 refills | Status: DC

## 2021-04-21 NOTE — Telephone Encounter (Signed)
°  Encourage patient to contact the pharmacy for refills or they can request refills through [place]:  Please schedule appointment if longer than 1 year  NEXT APPOINTMENT DATE:02/04/22  MEDICATION:pravastatin  Is the patient out of medication? yes  [facility]  Let patient know to contact pharmacy at the end of the day to make sure medication is ready.  Please notify patient to allow 48-72 hours to process  CLINICAL FILLS OUT ALL BELOW:   LAST REFILL:  QTY:  REFILL DATE:    OTHER COMMENTS:    Okay for refill?  Please advise Pt would like a one week refill because she is completely out

## 2021-04-21 NOTE — Telephone Encounter (Signed)
Rx sent electronically.  

## 2021-05-12 ENCOUNTER — Other Ambulatory Visit: Payer: Self-pay | Admitting: Internal Medicine

## 2021-05-12 ENCOUNTER — Telehealth: Payer: Self-pay | Admitting: Internal Medicine

## 2021-05-12 MED ORDER — BUPROPION HCL ER (SR) 200 MG PO TB12: 200.0000 mg | ORAL_TABLET | Freq: Two times a day (BID) | ORAL | 3 refills | Status: DC

## 2021-05-12 MED ORDER — BUSPIRONE HCL 15 MG PO TABS: 15.0000 mg | ORAL_TABLET | Freq: Two times a day (BID) | ORAL | 0 refills | Status: DC

## 2021-05-12 NOTE — Telephone Encounter (Signed)
Pt called in stating that she needs refill on bupropion ASAP. Pt has one pill left and pt would like a 2 week supply sent to [facility] in [place] on [address]. Pt stated that [facility] will reach out to office to get the three month supply

## 2021-05-12 NOTE — Telephone Encounter (Signed)
Rx sent electronically.  

## 2021-05-13 MED ORDER — BUPROPION HCL ER (SR) 200 MG PO TB12: 200.0000 mg | ORAL_TABLET | Freq: Two times a day (BID) | ORAL | 0 refills | Status: DC

## 2021-05-13 NOTE — Addendum Note (Signed)
Addended by: Pilar Grammes on: 05/13/2021 04:39 PM   Modules accepted: Orders

## 2021-05-13 NOTE — Telephone Encounter (Signed)
Pt needs a short supply on  buPROPion (WELLBUTRIN SR) 200 MG 12 hr tablet [facility]

## 2021-05-17 ENCOUNTER — Telehealth: Payer: Self-pay

## 2021-05-17 MED ORDER — MONTELUKAST SODIUM 10 MG PO TABS: 10.0000 mg | ORAL_TABLET | Freq: Every day | ORAL | 3 refills | Status: DC

## 2021-05-17 NOTE — Telephone Encounter (Signed)
Rx sent electronically.  

## 2021-06-09 ENCOUNTER — Other Ambulatory Visit: Payer: Self-pay | Admitting: Internal Medicine

## 2021-07-31 ENCOUNTER — Ambulatory Visit
Admission: EM | Admit: 2021-07-31 | Discharge: 2021-07-31 | Disposition: A | Discharge status: Home / Self Care | Payer: Medicare Other

## 2021-07-31 ENCOUNTER — Encounter: Payer: Self-pay | Admitting: Emergency Medicine

## 2021-07-31 DIAGNOSIS — J019 Acute sinusitis, unspecified: Secondary | ICD-10-CM | POA: Diagnosis not present

## 2021-07-31 DIAGNOSIS — R110 Nausea: Secondary | ICD-10-CM

## 2021-07-31 MED ORDER — AZELASTINE HCL 0.1 % NA SOLN
2.0000 | Freq: Two times a day (BID) | NASAL | 0 refills | Status: DC
Start: 2021-07-31 — End: 2021-10-15

## 2021-07-31 MED ORDER — ONDANSETRON HCL 4 MG PO TABS: 4.0000 mg | ORAL_TABLET | Freq: Four times a day (QID) | ORAL | 0 refills | Status: DC

## 2021-07-31 MED ORDER — AMOXICILLIN 875 MG PO TABS: 875.0000 mg | ORAL_TABLET | Freq: Two times a day (BID) | ORAL | 0 refills | Status: DC

## 2021-07-31 NOTE — ED Provider Notes (Signed)
?[facility] ? ? ? ?CSN: [id number] ?Arrival date & time: 07/31/21  1440 ? ? ?  ? ?History   ?Chief Complaint ?Chief Complaint  ?Patient presents with  ? Sinus pressure  ? Cough  ? ? ?HPI ?Andrea Noble is a 71 y.o. female.  ? ?HPI ?Patient presents for evaluation of sinus congestion, cough, fatigue, facial pressure x one week. Endorses nausea. No known sick contacts . She has tried OTC medications without improvement of symptoms. She afebrile and denies shortness of breath, chest pain or weakness. ? ?Past Medical History:  ?Diagnosis Date  ? Anxiety   ? Arthritis   ? Asthma   ? Cataract   ? Depression   ? Diverticulitis   ? GERD (gastroesophageal reflux disease)   ? Hodgkin's lymphoma ([place])   ? Hyperlipidemia   ? Hypothyroidism   ? Menopausal syndrome   ? OAB (overactive bladder)   ? Pneumonia   ? Seasonal allergies   ? SVT (supraventricular tachycardia) ([place])   ? Thyroid disease   ? hypothyroid  ? ? ?Patient Active Problem List  ? Diagnosis Date Noted  ? Preventative health care 02/03/2021  ? Mood disorder ([place]) 02/03/2021  ? Atherosclerosis of aorta ([place]) 02/03/2021  ? Intermittent asthma, well controlled 07/31/2020  ? SVT (supraventricular tachycardia) ([place])   ? Hyperlipidemia   ? Menopausal syndrome   ? OAB (overactive bladder)   ? Seasonal allergies   ? Lumbar radiculopathy 07/06/2020  ? History of Hodgkin's lymphoma 03/01/2017  ? ? ?Past Surgical History:  ?Procedure Laterality Date  ? BLEPHAROPLASTY Bilateral   ? BREAST BIOPSY    ? benign  ? BREAST BIOPSY  07/27/2018  ? [place] core bx venus COLUMNAR CELL METAPLASIA, AND PSEUDOANGIOMATOUS STROMAL  ? CARPAL TUNNEL RELEASE Right   ? CARPAL TUNNEL RELEASE Left   ? CATARACT EXTRACTION Bilateral 2015  ? CLEFT LIP REPAIR    ? COLONOSCOPY WITH PROPOFOL N/A 07/09/2018  ? Procedure: COLONOSCOPY WITH PROPOFOL;  Surgeon: Lollie Sails, MD;  Location: [facility] ENDOSCOPY;  Service: Endoscopy;  Laterality: N/A;  ? DE QUERVAIN'S RELEASE Bilateral   ? DIAGNOSTIC  LAPAROSCOPY    ? endometriosis  ? ESOPHAGOGASTRODUODENOSCOPY N/A 07/09/2018  ? Procedure: ESOPHAGOGASTRODUODENOSCOPY (EGD);  Surgeon: Lollie Sails, MD;  Location: [facility] ENDOSCOPY;  Service: Endoscopy;  Laterality: N/A;  ? FINGER SURGERY Right   ? Middle finger joint replaced  ? MAXIMUM ACCESS (MAS) TRANSFORAMINAL LUMBAR INTERBODY FUSION (TLIF) 2 LEVEL Right 07/06/2020  ? Procedure: L4/5, L5/S1 TRANSFORAMINAL LUMBAR INTERBODY FUSION (TLIF), L4-S1 PEDICLE SCREWS,DECOMPRESSION ;  Surgeon: Deetta Perla, MD;  Location: [facility] ORS;  Service: Neurosurgery;  Laterality: Right;  ? THUMB SURGERY Right   ? took tendon from forearm and wrapped it around the thumb joint  ? ? ?OB History   ?No obstetric history on file. ?  ? ? ? ?Home Medications   ? ?Prior to Admission medications   ?Medication Sig Start Date End Date Taking? Authorizing Provider  ?amoxicillin (AMOXIL) 875 MG tablet Take 1 tablet (875 mg total) by mouth 2 (two) times daily. 07/31/21  Yes Scot Jun, FNP  ?azelastine (ASTELIN) 0.1 % nasal spray Place 2 sprays into both nostrils 2 (two) times daily. Use in each nostril as directed 07/31/21  Yes Scot Jun, FNP  ?ondansetron (ZOFRAN) 4 MG tablet Take 1 tablet (4 mg total) by mouth every 6 (six) hours. 07/31/21  Yes Scot Jun, FNP  ?acetaminophen (TYLENOL) 650 MG CR tablet Take 1,300 mg by  mouth at bedtime.    [provider]  ?ADVAIR DISKUS 100-50 MCG/DOSE AEPB Inhale 1 puff into the lungs in the morning and at bedtime. 12/03/16   [provider]  ?albuterol (VENTOLIN HFA) 108 (90 Base) MCG/ACT inhaler Inhale 2 puffs into the lungs every 4 (four) hours as needed for wheezing or shortness of breath.    [provider]  ?buPROPion (WELLBUTRIN SR) 200 MG 12 hr tablet TAKE 1 TABLET(200 MG) BY MOUTH TWICE DAILY 06/09/21   Venia Carbon, MD  ?busPIRone (BUSPAR) 15 MG tablet Take 1 tablet (15 mg total) by mouth 2 (two) times daily. 05/12/21   Venia Carbon, MD  ?Calcium  Carbonate-Vit D-Min (CALTRATE MINIS PLUS MINERALS PO) Take 40 mcg by mouth daily.    [provider]  ?Carboxymethylcellulose Sodium (THERATEARS) 0.25 % SOLN Place 1 drop into both eyes daily as needed (Dry eyes).    [provider]  ?Collagen-Boron-Hyaluronic Acid (MOVE FREE ULTRA JOINT HEALTH) 40-5-3.3 MG TABS Take 1 tablet by mouth every evening.    [provider]  ?CRANBERRY PO Take 4,200 mg by mouth daily at 12 noon.    [provider]  ?diclofenac Sodium (VOLTAREN) 1 % GEL Apply 1 application topically 2 (two) times daily. 03/04/20   [provider]  ?diflorasone-emollient (APEXICON E) 0.05 % CREA Apply 1 application topically daily as needed (Eczema).    [provider]  ?diltiazem (CARDIZEM CD) 120 MG 24 hr capsule Take 120 mg by mouth daily. 05/14/21   [provider]  ?docusate sodium (COLACE) 100 MG capsule Take 100 mg by mouth 2 (two) times daily.    [provider]  ?fluocinonide cream (LIDEX) 3.26 % Apply 1 application topically daily as needed (Eczema).    [provider]  ?fluticasone (FLONASE) 50 MCG/ACT nasal spray Place 1 spray into both nostrils every morning. 07/16/12   [provider]  ?gabapentin (NEURONTIN) 100 MG capsule Take 200 mg by mouth 3 (three) times daily.    [provider]  ?Ivermectin 1 % CREA Apply 1 application topically daily. On face    [provider]  ?levothyroxine (SYNTHROID, LEVOTHROID) 50 MCG tablet Take 50-75 mcg by mouth See admin instructions. Take 75 mg every  Monday, Wednesday and Friday ?Take 50 mg all the other days    [provider]  ?liothyronine (CYTOMEL) 5 MCG tablet Take 2.5 mcg by mouth daily. 04/24/20   [provider]  ?meclizine (ANTIVERT) 25 MG tablet Take 25 mg by mouth daily as needed (Dizzyness). 07/16/05   [provider]  ?Melatonin 10 MG TABS Take 10 mg by mouth at bedtime. Timed-Release    [provider]   ?methocarbamol (ROBAXIN) 500 MG tablet Take 1 tablet (500 mg total) by mouth every 8 (eight) hours as needed for muscle spasms. 07/08/20   Deetta Perla, MD  ?methylcellulose (CITRUCEL) oral powder Take 1 Scoop by mouth at bedtime.    [provider]  ?montelukast (SINGULAIR) 10 MG tablet Take 1 tablet (10 mg total) by mouth at bedtime. 05/17/21   Venia Carbon, MD  ?Multiple Vitamin (MULTIVITAMIN) tablet Take 1 tablet by mouth daily. Vita Fusion adult Gummie    [provider]  ?pantoprazole (PROTONIX) 40 MG tablet Take 40 mg by mouth every morning.    [provider]  ?Polyethyl Glycol-Propyl Glycol (SYSTANE) 0.4-0.3 % GEL ophthalmic gel Place 1 application into both eyes at bedtime.    [provider]  ?polyethylene  glycol powder (GLYCOLAX/MIRALAX) 17 GM/SCOOP powder Take 17 g by mouth daily.    [provider]  ?pravastatin (PRAVACHOL) 40 MG tablet Take 2 tablets (80 mg total) by mouth daily. 04/21/21   Venia Carbon, MD  ?PREMPRO 0.45-1.5 MG tablet Take 1 tablet by mouth daily. 12/24/16   [provider]  ?Probiotic Product ([place]) CAPS Take 1 capsule by mouth at bedtime.    [provider]  ?saccharomyces boulardii (FLORASTOR) 250 MG capsule Take 500 mg by mouth daily.    [provider]  ?sodium chloride (OCEAN) 0.65 % SOLN nasal spray Place 1 spray into both nostrils 2 (two) times daily.    [provider]  ?solifenacin (VESICARE) 10 MG tablet Take 10 mg by mouth every other day. 1530 04/17/20   [provider]  ?traZODone (DESYREL) 50 MG tablet TAKE 1 TABLET AT BEDTIME 04/05/21   Venia Carbon, MD  ? ? ?Family History ?Family History  ?Problem Relation Age of Onset  ? Breast cancer Maternal Aunt   ? Anuerysm Mother   ? Stroke Mother   ? Heart attack Father   ? Alzheimer's disease Father   ? Developmental delay Brother   ? ? ?Social History ?Social History  ? ?Tobacco Use  ? Smoking status: Never   ? Smokeless tobacco: Never  ?Vaping Use  ? Vaping Use: Never used  ?Substance Use Topics  ? Alcohol use: Yes  ?  Comment: very occasional  ? Drug use: No  ? ? ? ?Allergies   ?Levofloxacin and Other ? ? ?Review of S

## 2021-07-31 NOTE — ED Triage Notes (Addendum)
Pt c/o sinus pressure/pain, drainage, dry throat, and fatigue for weeks.  ?

## 2021-08-10 ENCOUNTER — Ambulatory Visit: Payer: Medicare Other | Admitting: Physical Therapy

## 2021-08-23 ENCOUNTER — Encounter: Payer: Medicare Other | Admitting: Physical Therapy

## 2021-08-30 ENCOUNTER — Encounter: Payer: Medicare Other | Admitting: Physical Therapy

## 2021-09-06 ENCOUNTER — Encounter: Payer: Medicare Other | Admitting: Physical Therapy

## 2021-09-13 ENCOUNTER — Encounter: Payer: Self-pay | Admitting: Physical Therapy

## 2021-09-13 ENCOUNTER — Ambulatory Visit: Payer: Medicare Other | Attending: Obstetrics and Gynecology | Admitting: Physical Therapy

## 2021-09-13 DIAGNOSIS — M25511 Pain in right shoulder: Secondary | ICD-10-CM | POA: Insufficient documentation

## 2021-09-13 DIAGNOSIS — R279 Unspecified lack of coordination: Secondary | ICD-10-CM | POA: Insufficient documentation

## 2021-09-13 DIAGNOSIS — C8190 Hodgkin lymphoma, unspecified, unspecified site: Secondary | ICD-10-CM | POA: Insufficient documentation

## 2021-09-13 DIAGNOSIS — G8929 Other chronic pain: Secondary | ICD-10-CM | POA: Diagnosis present

## 2021-09-13 DIAGNOSIS — M533 Sacrococcygeal disorders, not elsewhere classified: Secondary | ICD-10-CM | POA: Diagnosis present

## 2021-09-13 DIAGNOSIS — M25551 Pain in right hip: Secondary | ICD-10-CM | POA: Diagnosis present

## 2021-09-13 DIAGNOSIS — M4125 Other idiopathic scoliosis, thoracolumbar region: Secondary | ICD-10-CM | POA: Diagnosis present

## 2021-09-13 NOTE — Therapy (Signed)
[facility] ?[place], [place], [address] ?Phone: [phone]   Fax:  [phone] ? ?Physical Therapy Evaluation ? ?Patient Details  ?Name: Andrea Noble ?MRN: [id number] ?Date of Birth: [birth year] ?Referring Provider (PT): Schemerhorn MD ? ? ?Encounter Date: 09/13/2021 ? ? PT End of Session - 09/13/21 1800   ? ? Visit Number 1   ? Number of Visits 10   ? Date for PT Re-Evaluation 11/22/21   ? PT Start Time 1400   ? PT Stop Time 1500   ? PT Time Calculation (min) 60 min   ? Activity Tolerance Patient tolerated treatment well   ? Behavior During Therapy [facility] for tasks assessed/performed   ? ?  ?  ? ?  ? ? ?Past Medical History:  ?Diagnosis Date  ? Anxiety   ? Arthritis   ? Asthma   ? Cataract   ? Depression   ? Diverticulitis   ? GERD (gastroesophageal reflux disease)   ? Hodgkin's lymphoma ([place]) 2006  ? underwent radiation and chemo, last CT scan since moving to [place] from [place] 2018  ? Hyperlipidemia   ? Hypothyroidism   ? Menopausal syndrome   ? OAB (overactive bladder)   ? Pneumonia   ? Seasonal allergies   ? SVT (supraventricular tachycardia) ([place])   ? Thyroid disease   ? hypothyroid  ? ? ?Past Surgical History:  ?Procedure Laterality Date  ? BLEPHAROPLASTY Bilateral   ? BREAST BIOPSY    ? benign  ? BREAST BIOPSY  07/27/2018  ? [place] core bx venus COLUMNAR CELL METAPLASIA, AND PSEUDOANGIOMATOUS STROMAL  ? CARPAL TUNNEL RELEASE Right   ? CARPAL TUNNEL RELEASE Left   ? CATARACT EXTRACTION Bilateral 2015  ? CLEFT LIP REPAIR    ? COLONOSCOPY WITH PROPOFOL N/A 07/09/2018  ? Procedure: COLONOSCOPY WITH PROPOFOL;  Surgeon: Lollie Sails, MD;  Location: [facility] ENDOSCOPY;  Service: Endoscopy;  Laterality: N/A;  ? DE QUERVAIN'S RELEASE Bilateral   ? DIAGNOSTIC LAPAROSCOPY    ? endometriosis  ? ESOPHAGOGASTRODUODENOSCOPY N/A 07/09/2018  ? Procedure: ESOPHAGOGASTRODUODENOSCOPY (EGD);  Surgeon: Lollie Sails, MD;  Location: [facility] ENDOSCOPY;  Service: Endoscopy;   Laterality: N/A;  ? FINGER SURGERY Right   ? Middle finger joint replaced  ? MAXIMUM ACCESS (MAS) TRANSFORAMINAL LUMBAR INTERBODY FUSION (TLIF) 2 LEVEL Right 07/06/2020  ? Procedure: L4/5, L5/S1 TRANSFORAMINAL LUMBAR INTERBODY FUSION (TLIF), L4-S1 PEDICLE SCREWS,DECOMPRESSION ;  Surgeon: Deetta Perla, MD;  Location: [facility] ORS;  Service: Neurosurgery;  Laterality: Right;  ? THUMB SURGERY Right   ? took tendon from forearm and wrapped it around the thumb joint  ? ? ?There were no vitals filed for this visit. ? ? ? Subjective Assessment - 09/13/21 1818   ? ? Subjective 1) urinary urgency/ fecal leakage: Sometimes pt has leakage 20% of the time.  Pt wear a pad as a precaution. Pt feels the urge to urinate when going from sit to stand after sitting for 1-2 hours.  Pt would wake up and need to go to the bathroom and she when she gets to the toilet , she has soft stool with urination. Pt is taking Miralax Colace and she wants to take as minimal as possible. Pt has fecal leakage recently when taking the laxatives when sitting on the toilet. Pt has Hx of diverticulitis. Pt has not messed her pants.   ? ?2) LBP 1/10 today, at its worst 5/10,  located at B L/S junciton ( pt points  to this area). pain is constant at low level. Started at least 20 years ago and she was on a step stool and reaching trying to paint and lost her balance and felt onto her knee.  Plus pt also had skiing accident when she felt flat on her back. Pt underwent laminectomy surgery March 2022. Pt rteported she had  "screws." Pt had PT at [facility] where she lives. LBP occurs after period of standing > 30 min.   ? ? 3) R hip bursitis started 2021 impacts her walking sometimes with going up stairs at home  with weight bearing .  Pt is not able to lie on her R side because of her R shoulder and R hip.    ? ?4) R shoulder pain for the past 5 years.  and limits with reaching behind her back and overheading.     Physical Walking: not able to walk daily  > 30 min  but is not walking the distance she used to walk.  ? ?  ? ?  ?  ? ?  ? ? ? ? ? [facility] PT Assessment - 09/13/21 1427   ? ?  ? Assessment  ? Medical Diagnosis Urinary Urgency   ? Referring Provider (PT) Schemerhorn MD   ?  ? Precautions  ? Precautions None   ?  ? Restrictions  ? Weight Bearing Restrictions No   ?  ? Balance Screen  ? Has the patient fallen in the past 6 months Yes   ? How many times? 1   ? Has the patient had a decrease in activity level because of a fear of falling?  No   ? Is the patient reluctant to leave their home because of a fear of falling?  No   ?  ? Observation/Other Assessments  ? Scoliosis R shoudler and L iliac rest lower, cnovex T/L junction   ?  ? Coordination  ? Coordination and Movement Description excessive chest breathing , upper trap overuse   ?  ? Squat  ? Comments knees straight   ?  ? AROM  ? Overall AROM Comments LBP with R sideflexion   ?  ? Strength  ? Overall Strength Comments R LE 4-/ 5, L 5/5   ?  ? Ambulation/Gait  ? Gait velocity 1.11. m/s   ? Gait Comments R hip higher, pelvic shift R , L trunk L   ? ?  ?  ? ?  ? ? ? ? ? ? ? ? ? ? ? ? ? ?Objective measurements completed on examination: See above findings.  ? ? ? ? ? [facility] PT Treatment/Exercise - 09/13/21 1427   ? ?  ? Therapeutic Activites   ? Other Therapeutic Activities explained the role of scoliosis on Sx and plan to treat multiple body aches/ pains to create proper structural alignment and then progressing to pelvic floor   ?  ? Neuro Re-ed   ? Neuro Re-ed Details  cued for proper sit to stand to minmize straining pelvic floor. minimize leakage   ? ?  ?  ? ?  ? ? ? ? ? ? ? ? ? ? ? ? ? ? ? PT Long Term Goals - 09/13/21 1434   ? ?  ? PT LONG TERM GOAL #1  ? Title Pt will report decreased urgency to urinate after 1.5 hours of travelling, going from sit to stand ,  and without altered gait to the bathroom without leakage  to  demo improved continence and to visit step mom in [place]   ? Baseline urge to urinate  after 1-2 hours and uses altered gait pattern   ? Time 6   ? Period Weeks   ? Status New   ? Target Date 10/25/21   ?  ? PT LONG TERM GOAL #2  ? Title Pt will demo levelled pelvic girdle and shoulder and less scoliotic curve to progress to deep core HEP in order to improve postural stability for QOL   ? Baseline R shoudler and L iliac rest lower, convex T/L junction   ? Time 4   ? Period Weeks   ? Status New   ? Target Date 10/11/21   ?  ? PT LONG TERM GOAL #3  ? Title Pt will demo improved gait speed from 1.11 m/s to > 1.3 m/s and demo reciporcal gait pattern to progress to walking longer distance   ? Time 6   ? Status New   ? Target Date 10/25/21   ?  ? PT LONG TERM GOAL #4  ? Title Pt will demo increased R hip LE from 4-/5 to 5/5  and no LBP with R sideflexion in order to sleep on R side with no R hip bursitis pain   ? Time 8   ? Period Weeks   ? Status New   ? Target Date 11/08/21   ?  ? PT LONG TERM GOAL #5  ? Title Pt will improve bowel leakage on FOTO from 62 pt by > 5 pt point change in order to demo more control of sphincter when urinating   ? Time 10   ? Status New   ? Target Date 11/22/21   ? ?  ?  ? ?  ? ? ? ? ? ? ? ? ? Plan - 09/13/21 1802   ? ? Clinical Impression Statement  ?Pt is a   71 yo  who presents with urinary urgency/ fecal leakage, LBP, R hip bursitis, R shoulder pain which impact her ADLs and QOL.   ? ?Pt's musculoskeletal assessment revealed scoliosis, uneven pelvic girdle and shoulder height with convex R lumbar curve, excessive chest breathing, limited spinal /pelvic mobility, dyscoordination and strength of pelvic floor mm, weak R LE strength, and poor body mechanics which places strain on the abdominal/pelvic floor mm.  ? ?These are deficits that indicate an ineffective intraabdominal pressure system associated with increased risk for pt's Sx.  ?  ?Pt was provided education on etiology of Sx with anatomy, physiology explanation with images along with the benefits of customized pelvic PT  Tx based on pt's medical conditions and musculoskeletal deficits.  Explained the physiology of deep core mm coordination and roles of pelvic floor function in urination, defecation, sexual function, and

## 2021-09-13 NOTE — Patient Instructions (Signed)
?  Proper body mechanics with getting out of a chair to decrease strain  on back &pelvic floor   Avoid holding your breath when Getting out of the chair:  Scoot to front part of chair chair Heels behind knees, feet are hip width apart, nose over toes  Inhale like you are smelling roses Exhale to stand    

## 2021-09-20 ENCOUNTER — Ambulatory Visit: Payer: Medicare Other | Admitting: Physical Therapy

## 2021-09-20 VITALS — BP 142/64 | HR 86

## 2021-09-20 DIAGNOSIS — R279 Unspecified lack of coordination: Secondary | ICD-10-CM

## 2021-09-20 DIAGNOSIS — M4125 Other idiopathic scoliosis, thoracolumbar region: Secondary | ICD-10-CM

## 2021-09-20 DIAGNOSIS — M25551 Pain in right hip: Secondary | ICD-10-CM

## 2021-09-20 DIAGNOSIS — M533 Sacrococcygeal disorders, not elsewhere classified: Secondary | ICD-10-CM

## 2021-09-20 DIAGNOSIS — G8929 Other chronic pain: Secondary | ICD-10-CM

## 2021-09-20 NOTE — Patient Instructions (Signed)
   Side of hip stretch:  Reclined twist for hips and side of the hips/ legs  Lay on your back, knees bend Scoot hips to the R , leave shoulders in place Drop knees to the L side resting onto pillows to keep leg at the same width of hips Pillow under L thigh to minimize too much strain    Follow up with medical doctors about your light headed ness, fuzzy

## 2021-09-21 NOTE — Therapy (Signed)
[place] MAIN [facility] SERVICES [address] [place], [place], [address] Phone: [phone]   Fax:  [phone]  Physical Therapy Treatment  Patient Details  Name: Andrea Noble MRN: [id number] Date of Birth: Sep 30, 1950 Referring Provider (PT): Schemerhorn MD   Encounter Date: 09/20/2021   PT End of Session - 09/20/21 1506     Visit Number 2    Number of Visits 10    Date for PT Re-Evaluation 11/22/21    PT Start Time 3267    PT Stop Time 1245    PT Time Calculation (min) 64 min    Activity Tolerance Patient tolerated treatment well    Behavior During Therapy [facility] for tasks assessed/performed             Past Medical History:  Diagnosis Date   Anxiety    Arthritis    Asthma    Cataract    Depression    Diverticulitis    GERD (gastroesophageal reflux disease)    Hodgkin's lymphoma ([place]) 2006   underwent radiation and chemo, last CT scan since moving to [place] from [place]   Hyperlipidemia    Hypothyroidism    Menopausal syndrome    OAB (overactive bladder)    Pneumonia    Seasonal allergies    SVT (supraventricular tachycardia) ([place])    Thyroid disease    hypothyroid    Past Surgical History:  Procedure Laterality Date   BLEPHAROPLASTY Bilateral    BREAST BIOPSY     benign   BREAST BIOPSY  07/27/2018   [place] core bx venus COLUMNAR CELL METAPLASIA, AND PSEUDOANGIOMATOUS STROMAL   CARPAL TUNNEL RELEASE Right    CARPAL TUNNEL RELEASE Left    CATARACT EXTRACTION Bilateral 2015   CLEFT LIP REPAIR     COLONOSCOPY WITH PROPOFOL N/A 07/09/2018   Procedure: COLONOSCOPY WITH PROPOFOL;  Surgeon: Lollie Sails, MD;  Location: [facility] ENDOSCOPY;  Service: Endoscopy;  Laterality: N/A;   DE QUERVAIN'S RELEASE Bilateral    DIAGNOSTIC LAPAROSCOPY     endometriosis   ESOPHAGOGASTRODUODENOSCOPY N/A 07/09/2018   Procedure: ESOPHAGOGASTRODUODENOSCOPY (EGD);  Surgeon: Lollie Sails, MD;  Location: [facility] ENDOSCOPY;  Service: Endoscopy;   Laterality: N/A;   FINGER SURGERY Right    Middle finger joint replaced   MAXIMUM ACCESS (MAS) TRANSFORAMINAL LUMBAR INTERBODY FUSION (TLIF) 2 LEVEL Right 07/06/2020   Procedure: L4/5, L5/S1 TRANSFORAMINAL LUMBAR INTERBODY FUSION (TLIF), L4-S1 PEDICLE SCREWS,DECOMPRESSION ;  Surgeon: Deetta Perla, MD;  Location: [facility] ORS;  Service: Neurosurgery;  Laterality: Right;   THUMB SURGERY Right    took tendon from forearm and wrapped it around the thumb joint    Vitals:   09/20/21 1411 09/20/21 1507  BP: (!) 143/63 (!) 142/64  Pulse: 87 86     Subjective Assessment - 09/20/21 1411     Subjective Pt reports feeling low on energy the past weeks since she had her back surgery March 2022. Pt had some nerve damage from her RLE and sciatic  and was not getting alot of exericse prior surgery.  Denied dizziness. Pt reports feeling light-headed. If she gets her heart rate up and walking, she gets SOB. Pt has been taking it slower than she had been. Pt also feels "fuzzy" inside her head but it is not brain fog nor related to memory recall. Pt has had brain fog when she had cancer. Pt's mother had TIA and dementia, father also with dementia and alzheimers.  Pt goes to bed 11- 11:30pm. Pt gets  woken up with dry mouth or the need to go to the bathroom. Pt slept better when her hsuband was away because typically his sleep schedule differs from her. Pt 's husband goes to sleep between the recliner and the bed and more frequently now, he has to return to the recliner because his back hurts when in the bed. She thinks that every now and then she is aware when he is trying to get into bed. He also snores. Pt states she plans to contact her oncologist who has not seen in 5 years. Pt also plans to contact her PCP. Pt also stated she needs to set up a routinte check up with her optmetrist.    Pertinent History Nocturia once a night and pt has a hard time going back to sleep. Pt feels fatigued lately like she did when she found  out about Hodgkin Lymphoma in 2006. Pt is limited to sleeping on her back because she can not sleep on R shoulder due to R shoulder bursitis and uses a pilow between her knees. Husband gets up earlier than pt and that gets up rouses her awake.  Mild scoliosis, shoe lift in L                [facility] PT Assessment - 09/21/21 1325       Palpation   Spinal mobility R thoracic segments hypomobile, tenderness at interspinals    SI assessment  levelled pelvic girdle                           [facility] Adult PT Treatment/Exercise - 09/21/21 1326       Therapeutic Activites    Other Therapeutic Activities active listenting, motivational interviewing about pt's medical Sx, advised pt to f/u with her MDs, pt voiced agreement      Neuro Re-ed    Neuro Re-ed Details  cued for R trunk rotation HEP      Modalities   Modalities Moist Heat      Moist Heat Therapy   Number Minutes Moist Heat 5 Minutes    Moist Heat Location --   R midback, in lower trunk rotation to promote R rotation (unbilled)     Manual Therapy   Manual therapy comments STM/MWM at R interspinals , modified palpation pressure to minimzie tenderness and changed technique                          PT Long Term Goals - 09/13/21 1434       PT LONG TERM GOAL #1   Title Pt will report decreased urgency to urinate after 1.5 hours of travelling, going from sit to stand ,  and without altered gait to the bathroom without leakage  to demo improved continence and to visit step mom in [place] urge to urinate after 1-2 hours and uses altered gait pattern    Time 6    Period Weeks    Status New    Target Date 10/25/21      PT LONG TERM GOAL #2   Title Pt will demo levelled pelvic girdle and shoulder and less scoliotic curve to progress to deep core HEP in order to improve postural stability for QOL    Baseline R shoudler and L iliac rest lower, convex T/L junction    Time 4    Period  Weeks    Status New  Target Date 10/11/21      PT LONG TERM GOAL #3   Title Pt will demo improved gait speed from 1.11 m/s to > 1.3 m/s and demo reciporcal gait pattern to progress to walking longer distance    Time 6    Status New    Target Date 10/25/21      PT LONG TERM GOAL #4   Title Pt will demo increased R hip LE from 4-/5 to 5/5  and no LBP with R sideflexion in order to sleep on R side with no R hip bursitis pain    Time 8    Period Weeks    Status New    Target Date 11/08/21      PT LONG TERM GOAL #5   Title Pt will improve bowel leakage on FOTO from 62 pt by > 5 pt point change in order to demo more control of sphincter when urinating    Time 10    Status New    Target Date 11/22/21                   Plan - 09/20/21 1506     Clinical Impression Statement Pt showed good carry over with levelled pelvic alignment today compared to last session. Pt still required skilled PT to address limited thoracic rotation on R. Pt achieved improved thoracic mobility post Tx which will optimize her diaphragmatic breathing and lower her R shoulder height. Plan to progress with deep core/ diaphragm breathing next session to later progress to coordination of pelvic floor.   Monitored pt's BP since pt reported feeling light-headed and "fuzzy" inside her head at the beginning of her session. BP remained consistent. Encouraged pt to f/u with her medical providers.  Pt states she plans to contact her oncologist who has not seen in 5 years. Pt also plans to contact her PCP. Pt also stated she needs to set up a routinte check up with her optmetrist.  Pt continues to benefit from skilled PT.   Personal Factors and Comorbidities Comorbidity 3+    Comorbidities Hodgkin Lymphoma in 2006 with chemo, radiation Tx, scoliosis    Stability/Clinical Decision Making Evolving/Moderate complexity    Rehab Potential Good    PT Frequency 1x / week    PT Duration Other (comment)   10   PT  Treatment/Interventions Neuromuscular re-education;Balance training;Therapeutic exercise;Gait training;Stair training;Functional mobility training;Therapeutic activities;Splinting;Taping;Patient/family education;Scar mobilization;Dry needling;Manual techniques;Electrical Stimulation;Cryotherapy;Moist Heat;ADLs/Self Care Home Management;Traction;Energy conservation    Consulted and Agree with Plan of Care Patient             Patient will benefit from skilled therapeutic intervention in order to improve the following deficits and impairments:  Decreased activity tolerance, Decreased balance, Decreased coordination, Postural dysfunction, Increased muscle spasms, Improper body mechanics, Decreased range of motion, Decreased mobility, Pain, Decreased scar mobility, Decreased endurance, Hypomobility, Impaired tone, Increased edema, Decreased strength, Increased fascial restricitons, Decreased knowledge of use of DME, Hypermobility, Difficulty walking, Decreased safety awareness, Impaired flexibility, Impaired UE functional use, Decreased skin integrity, Cardiopulmonary status limiting activity  Visit Diagnosis: Other idiopathic scoliosis, thoracolumbar region  Chronic right shoulder pain  Pain in right hip  Sacrococcygeal disorders, not elsewhere classified  Unspecified lack of coordination     Problem List Patient Active Problem List   Diagnosis Date Noted   Hodgkin's lymphoma ([place]) 09/13/2021   Preventative health care 02/03/2021   Mood disorder ([place]) 02/03/2021   Atherosclerosis of aorta ([place]) 02/03/2021   Intermittent asthma, well controlled  07/31/2020   SVT (supraventricular tachycardia) ([facility])    Hyperlipidemia    Menopausal syndrome    OAB (overactive bladder)    Seasonal allergies    Lumbar radiculopathy 07/06/2020   History of Hodgkin's lymphoma 03/01/2017    Jerl Mina, PT 09/21/2021, 1:28 PM  [place] MAIN [facility] SERVICES [address] [place], [place], [address] Phone: [phone]   Fax:  [phone]  Name: Amaka Gluth MRN: [id number] Date of Birth: 06-14-1950

## 2021-09-28 ENCOUNTER — Ambulatory Visit: Payer: Medicare Other | Admitting: Physical Therapy

## 2021-10-04 ENCOUNTER — Telehealth: Payer: Self-pay

## 2021-10-04 MED ORDER — FLUTICASONE-SALMETEROL 100-50 MCG/ACT IN AEPB: 1.0000 | INHALATION_SPRAY | Freq: Two times a day (BID) | RESPIRATORY_TRACT | 3 refills | Status: DC

## 2021-10-04 NOTE — Telephone Encounter (Signed)
Rx sent electronically.  

## 2021-10-05 ENCOUNTER — Ambulatory Visit: Payer: Medicare Other | Admitting: Physical Therapy

## 2021-10-15 ENCOUNTER — Encounter: Payer: Self-pay | Admitting: Internal Medicine

## 2021-10-15 ENCOUNTER — Ambulatory Visit (INDEPENDENT_AMBULATORY_CARE_PROVIDER_SITE_OTHER): Payer: Medicare Other | Admitting: Internal Medicine

## 2021-10-15 DIAGNOSIS — Z8571 Personal history of Hodgkin lymphoma: Secondary | ICD-10-CM

## 2021-10-15 DIAGNOSIS — R195 Other fecal abnormalities: Secondary | ICD-10-CM | POA: Diagnosis not present

## 2021-10-15 DIAGNOSIS — R059 Cough, unspecified: Secondary | ICD-10-CM | POA: Insufficient documentation

## 2021-10-15 MED ORDER — BUSPIRONE HCL 15 MG PO TABS: 15.0000 mg | ORAL_TABLET | Freq: Two times a day (BID) | ORAL | 3 refills | Status: DC

## 2021-10-15 NOTE — Assessment & Plan Note (Signed)
And dry throat Not sick Discussed trying off the vesicare to see if that helps the symptoms without affecting her bladder adversely

## 2021-10-15 NOTE — Patient Instructions (Addendum)
Stop the miralax now. If your stools stay loose, stop the stool softener as well. If you get bound up, you may want to restart the miralax--but only every 2-3 days.  I think you should try off the vesicare. It should help your dry mouth and likely cough---hopefully it won't be a problem with your bladder.

## 2021-10-15 NOTE — Progress Notes (Signed)
Subjective:    Patient ID: Andrea Noble, female    DOB: [birth year], 71 y.o.   MRN: [id number]  HPI Here due to loose stools and cold symptoms  Having bowel changes Will have loose stools with some liquid--may go 4 times a day (and can feel weak after) Still on one stool softener and 1/2 miralax daily No blood Will get some urinary urgency--then will pass stool at the same time  Will resume pelvic floor training when appt available Using solifenacin every other day  Having dry cough Gets dry mouth--uses some mints for this (and xyla melts) Then "throat feels stuck together" Some cough when upon first lying down in bed  Current Outpatient Medications on File Prior to Visit  Medication Sig Dispense Refill   acetaminophen (TYLENOL) 650 MG CR tablet Take 1,300 mg by mouth at bedtime.     albuterol (VENTOLIN HFA) 108 (90 Base) MCG/ACT inhaler Inhale 2 puffs into the lungs every 4 (four) hours as needed for wheezing or shortness of breath.     buPROPion (WELLBUTRIN SR) 200 MG 12 hr tablet TAKE 1 TABLET(200 MG) BY MOUTH TWICE DAILY 60 tablet 11   busPIRone (BUSPAR) 15 MG tablet Take 1 tablet (15 mg total) by mouth 2 (two) times daily. 28 tablet 0   Calcium Carbonate-Vit D-Min (CALTRATE MINIS PLUS MINERALS PO) Take 40 mcg by mouth daily.     Carboxymethylcellulose Sodium (THERATEARS) 0.25 % SOLN Place 1 drop into both eyes daily as needed (Dry eyes).     Collagen-Boron-Hyaluronic Acid (MOVE FREE ULTRA JOINT HEALTH) 40-5-3.3 MG TABS Take 1 tablet by mouth every evening.     CRANBERRY PO Take 4,200 mg by mouth daily at 12 noon.     diclofenac Sodium (VOLTAREN) 1 % GEL Apply 1 application topically 2 (two) times daily.     diflorasone-emollient (APEXICON E) 0.05 % CREA Apply 1 application  topically daily as needed (Eczema).     diltiazem (CARDIZEM CD) 120 MG 24 hr capsule Take 120 mg by mouth daily.     docusate sodium (COLACE) 100 MG capsule Take 100 mg by mouth 2 (two) times daily.      fluocinonide cream (LIDEX) 8.18 % Apply 1 application topically daily as needed (Eczema).     fluticasone (FLONASE) 50 MCG/ACT nasal spray Place 1 spray into both nostrils every morning.     fluticasone-salmeterol (ADVAIR) 100-50 MCG/ACT AEPB Inhale 1 puff into the lungs 2 (two) times daily. 1 each 3   gabapentin (NEURONTIN) 100 MG capsule Take 200 mg by mouth 3 (three) times daily.     Ivermectin 1 % CREA Apply 1 application topically daily. On face     levothyroxine (SYNTHROID, LEVOTHROID) 50 MCG tablet Take 50-75 mcg by mouth See admin instructions. Take 75 mg every  Monday, Wednesday and Friday Take 50 mg all the other days     liothyronine (CYTOMEL) 5 MCG tablet Take 2.5 mcg by mouth daily.     meclizine (ANTIVERT) 25 MG tablet Take 25 mg by mouth daily as needed (Dizzyness).     Melatonin 10 MG TABS Take 10 mg by mouth at bedtime. Timed-Release     methocarbamol (ROBAXIN) 500 MG tablet Take 1 tablet (500 mg total) by mouth every 8 (eight) hours as needed for muscle spasms. 60 tablet 1   methylcellulose (CITRUCEL) oral powder Take 1 Scoop by mouth at bedtime.     montelukast (SINGULAIR) 10 MG tablet Take 1 tablet (10 mg total) by mouth at  bedtime. 90 tablet 3   Multiple Vitamin (MULTIVITAMIN) tablet Take 1 tablet by mouth daily. Vita Fusion adult Gummie     ondansetron (ZOFRAN) 4 MG tablet Take 1 tablet (4 mg total) by mouth every 6 (six) hours. 12 tablet 0   pantoprazole (PROTONIX) 40 MG tablet Take 40 mg by mouth every morning.     Polyethyl Glycol-Propyl Glycol (SYSTANE) 0.4-0.3 % GEL ophthalmic gel Place 1 application into both eyes at bedtime.     polyethylene glycol powder (GLYCOLAX/MIRALAX) 17 GM/SCOOP powder Take 17 g by mouth daily.     pravastatin (PRAVACHOL) 40 MG tablet Take 2 tablets (80 mg total) by mouth daily. 180 tablet 3   PREMPRO 0.45-1.5 MG tablet Take 1 tablet by mouth daily.     Probiotic Product ([place]) CAPS Take 1 capsule by mouth at bedtime.      saccharomyces boulardii (FLORASTOR) 250 MG capsule Take 500 mg by mouth daily.     sodium chloride (OCEAN) 0.65 % SOLN nasal spray Place 1 spray into both nostrils 2 (two) times daily.     solifenacin (VESICARE) 10 MG tablet Take 10 mg by mouth every other day. 1530     traZODone (DESYREL) 50 MG tablet TAKE 1 TABLET AT BEDTIME 90 tablet 3   No current facility-administered medications on file prior to visit.    Allergies  Allergen Reactions   Levofloxacin     IV Levaquin causes burning   Other Itching    Surgical glue    Past Medical History:  Diagnosis Date   Anxiety    Arthritis    Asthma    Cataract    Depression    Diverticulitis    GERD (gastroesophageal reflux disease)    Hodgkin's lymphoma ([place]) 2006   underwent radiation and chemo, last CT scan since moving to [place] from [place]   Hyperlipidemia    Hypothyroidism    Menopausal syndrome    OAB (overactive bladder)    Pneumonia    Seasonal allergies    SVT (supraventricular tachycardia) ([facility])    Thyroid disease    hypothyroid    Past Surgical History:  Procedure Laterality Date   BLEPHAROPLASTY Bilateral    BREAST BIOPSY     benign   BREAST BIOPSY  07/27/2018   [place] core bx venus COLUMNAR CELL METAPLASIA, AND PSEUDOANGIOMATOUS STROMAL   CARPAL TUNNEL RELEASE Right    CARPAL TUNNEL RELEASE Left    CATARACT EXTRACTION Bilateral 2015   CLEFT LIP REPAIR     COLONOSCOPY WITH PROPOFOL N/A 07/09/2018   Procedure: COLONOSCOPY WITH PROPOFOL;  Surgeon: Lollie Sails, MD;  Location: [facility] ENDOSCOPY;  Service: Endoscopy;  Laterality: N/A;   DE QUERVAIN'S RELEASE Bilateral    DIAGNOSTIC LAPAROSCOPY     endometriosis   ESOPHAGOGASTRODUODENOSCOPY N/A 07/09/2018   Procedure: ESOPHAGOGASTRODUODENOSCOPY (EGD);  Surgeon: Lollie Sails, MD;  Location: [facility] ENDOSCOPY;  Service: Endoscopy;  Laterality: N/A;   FINGER SURGERY Right    Middle finger joint replaced   MAXIMUM ACCESS (MAS) TRANSFORAMINAL LUMBAR INTERBODY FUSION  (TLIF) 2 LEVEL Right 07/06/2020   Procedure: L4/5, L5/S1 TRANSFORAMINAL LUMBAR INTERBODY FUSION (TLIF), L4-S1 PEDICLE SCREWS,DECOMPRESSION ;  Surgeon: Deetta Perla, MD;  Location: [facility] ORS;  Service: Neurosurgery;  Laterality: Right;   THUMB SURGERY Right    took tendon from forearm and wrapped it around the thumb joint    Family History  Problem Relation Age of Onset   Breast cancer Maternal Aunt    Anuerysm Mother  Stroke Mother    Heart attack Father    Alzheimer's disease Father    Developmental delay Brother     Social History   Socioeconomic History   Marital status: Married    Spouse name: Dwight   Number of children: 0   Years of education: Not on file   Highest education level: Not on file  Occupational History   Occupation: Social work/chaplain    Comment: 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 [employer]  Tobacco Use   Smoking status: Never    Passive exposure: Past   Smokeless tobacco: Never  Vaping Use   Vaping Use: Never used  Substance and Sexual Activity   Alcohol use: Yes    Comment: very occasional   Drug use: No   Sexual activity: Not on file  Other Topics Concern   Not on file  Social History Narrative   Has living will   Husband is health care POA   Would accept resuscitation attempts   No feeding tube if cognitively unaware   Social Determinants of Health   Financial Resource Strain: Not on file  Food Insecurity: Not on file  Transportation Needs: Not on file  Physical Activity: Not on file  Stress: Not on file  Social Connections: Not on file  Intimate Partner Violence: Not on file    Review of Systems Has had increase diltiazem dose for the BP Not feeling sick    Objective:   Physical Exam Constitutional:      Appearance: Normal appearance.  HENT:     Mouth/Throat:     Mouth: Mucous membranes are dry.     Pharynx: No oropharyngeal exudate or posterior oropharyngeal erythema.  Abdominal:     Palpations: Abdomen is soft.     Tenderness: There is no  abdominal tenderness. There is no guarding.  Neurological:     Mental Status: She is alert.            Assessment & Plan:

## 2021-10-15 NOTE — Assessment & Plan Note (Signed)
Still may be just change in her normal pattern and should be better if she stops stool softener and miralax. Instructions given Doesn't sound like illness or pathologic

## 2021-10-15 NOTE — Assessment & Plan Note (Signed)
Asked her to call Dr Gary Fleet office to see if she needs a follow up

## 2021-10-27 ENCOUNTER — Ambulatory Visit: Payer: Medicare Other | Attending: Obstetrics and Gynecology | Admitting: Physical Therapy

## 2021-10-27 DIAGNOSIS — M4125 Other idiopathic scoliosis, thoracolumbar region: Secondary | ICD-10-CM | POA: Diagnosis present

## 2021-10-27 DIAGNOSIS — M533 Sacrococcygeal disorders, not elsewhere classified: Secondary | ICD-10-CM | POA: Diagnosis present

## 2021-10-27 DIAGNOSIS — R279 Unspecified lack of coordination: Secondary | ICD-10-CM | POA: Insufficient documentation

## 2021-10-27 DIAGNOSIS — G8929 Other chronic pain: Secondary | ICD-10-CM | POA: Insufficient documentation

## 2021-10-27 DIAGNOSIS — M25511 Pain in right shoulder: Secondary | ICD-10-CM | POA: Diagnosis present

## 2021-10-27 DIAGNOSIS — M25551 Pain in right hip: Secondary | ICD-10-CM | POA: Insufficient documentation

## 2021-10-27 NOTE — Patient Instructions (Signed)
Deep core    ___  Clam Shell 45 Degrees  Lying with hips and knees bent 45, one pillow between knees and ankles. Heel together, toes apart like ballerina,  Lift knee with exhale while pressing heels together. Be sure pelvis does not roll backward. Do not arch back. Do 20 times, each leg, 2 times per day.    Complimentary stretch: Figure-4   5 breaths

## 2021-10-27 NOTE — Therapy (Signed)
[place] MA[facility] SERVICES [address] [place], [place], [address] Phone: [phone]   Fax:  [phone]  Physical Therapy Treatment  Patient Details  Name: Andrea Noble MRN: [id number] Date of Birth: 04-04-1951 Referring Provider (PT): Schemerhorn MD   Encounter Date: 10/27/2021   PT End of Session - 10/27/21 1018     Visit Number 3    Number of Visits 10    Date for PT Re-Evaluation 11/22/21    PT Start Time 1006    PT Stop Time 1100    PT Time Calculation (min) 54 min    Activity Tolerance Patient tolerated treatment well    Behavior During Therapy [facility] for tasks assessed/performed             Past Medical History:  Diagnosis Date   Anxiety    Arthritis    Asthma    Cataract    Depression    Diverticulitis    GERD (gastroesophageal reflux disease)    Hodgkin's lymphoma ([place]) 2006   underwent radiation and chemo, last CT scan since moving to [place] from [place]   Hyperlipidemia    Hypothyroidism    Menopausal syndrome    OAB (overactive bladder)    Pneumonia    Seasonal allergies    SVT (supraventricular tachycardia) ([place])    Thyroid disease    hypothyroid    Past Surgical History:  Procedure Laterality Date   BLEPHAROPLASTY Bilateral    BREAST BIOPSY     benign   BREAST BIOPSY  07/27/2018   [place] core bx venus COLUMNAR CELL METAPLASIA, AND PSEUDOANGIOMATOUS STROMAL   CARPAL TUNNEL RELEASE Right    CARPAL TUNNEL RELEASE Left    CATARACT EXTRACTION Bilateral 2015   CLEFT LIP REPAIR     COLONOSCOPY WITH PROPOFOL N/A 07/09/2018   Procedure: COLONOSCOPY WITH PROPOFOL;  Surgeon: Lollie Sails, MD;  Location: [facility] ENDOSCOPY;  Service: Endoscopy;  Laterality: N/A;   DE QUERVAIN'S RELEASE Bilateral    DIAGNOSTIC LAPAROSCOPY     endometriosis   ESOPHAGOGASTRODUODENOSCOPY N/A 07/09/2018   Procedure: ESOPHAGOGASTRODUODENOSCOPY (EGD);  Surgeon: Lollie Sails, MD;  Location: [facility] ENDOSCOPY;  Service: Endoscopy;   Laterality: N/A;   FINGER SURGERY Right    Middle finger joint replaced   MAXIMUM ACCESS (MAS) TRANSFORAMINAL LUMBAR INTERBODY FUSION (TLIF) 2 LEVEL Right 07/06/2020   Procedure: L4/5, L5/S1 TRANSFORAMINAL LUMBAR INTERBODY FUSION (TLIF), L4-S1 PEDICLE SCREWS,DECOMPRESSION ;  Surgeon: Deetta Perla, MD;  Location: [facility] ORS;  Service: Neurosurgery;  Laterality: Right;   THUMB SURGERY Right    took tendon from forearm and wrapped it around the thumb joint    There were no vitals filed for this visit.   Subjective Assessment - 10/27/21 1011     Subjective Pt saw her PCP and GI about her loose stools which comes on 4 x a day. They advised her to stop taking Miralax and start taking stool softener. Pt takes one stool softerner a day and she was not able to get her poop out and she had to use the wipe to massage her sphincter. It took her two hours to complete the bowel movement.  daily fluid intake: 16  fl oz of  coffee, 2 oz cranberry juice, 84 fl oz water. Pt has not seen her oncologist yet.  Pt reports she plans to walk in doors to help with her asthma.    Pertinent History Nocturia once a night and pt has a hard time going back to  sleep. Pt feels fatigued lately like she did when she found out about Hodgkin Lymphoma in 2006. Pt is limited to sleeping on her back because she can not sleep on R shoulder due to R shoulder bursitis and uses a pilow between her knees. Husband gets up earlier than pt and that gets up rouses her awake.  Mild scoliosis, shoe lift in L    Patient Stated Goals not have urinary urgency and leakage. stepping off sidewalking without hurting.                [facility] PT Assessment - 10/27/21 1032       Observation/Other Assessments   Scoliosis levelled shoulder and pelvic girdle      Coordination   Coordination and Movement Description chest breathing      AROM   Overall AROM Comments no LBP with all directions      Palpation   SI assessment  levelled pelvic girdle       Ambulation/Gait   Gait velocity 1.17 m/s    Gait Comments levelled hips, reciporcal gait                           [facility] Adult PT Treatment/Exercise - 10/27/21 1032       Therapeutic Activites    Other Therapeutic Activities active listening to her medical updates , discussed about referral to nutritionist      Neuro Re-ed    Neuro Re-ed Details  cued for deep core HEP and clam and stretches                          PT Long Term Goals - 09/13/21 1434       PT LONG TERM GOAL #1   Title Pt will report decreased urgency to urinate after 1.5 hours of travelling, going from sit to stand ,  and without altered gait to the bathroom without leakage  to demo improved continence and to visit step mom in [place]    Baseline urge to urinate after 1-2 hours and uses altered gait pattern    Time 6    Period Weeks    Status New    Target Date 10/25/21      PT LONG TERM GOAL #2   Title Pt will demo levelled pelvic girdle and shoulder and less scoliotic curve to progress to deep core HEP in order to improve postural stability for QOL    Baseline R shoudler and L iliac rest lower, convex T/L junction    Time 4    Period Weeks    Status New    Target Date 10/11/21      PT LONG TERM GOAL #3   Title Pt will demo improved gait speed from 1.11 m/s to > 1.3 m/s and demo reciporcal gait pattern to progress to walking longer distance    Time 6    Status New    Target Date 10/25/21      PT LONG TERM GOAL #4   Title Pt will demo increased R hip LE from 4-/5 to 5/5  and no LBP with R sideflexion in order to sleep on R side with no R hip bursitis pain    Time 8    Period Weeks    Status New    Target Date 11/08/21      PT LONG TERM GOAL #5   Title Pt will improve bowel leakage on  FOTO from 73 pt by > 5 pt point change in order to demo more control of sphincter when urinating    Time 10    Status New    Target Date 11/22/21                    Plan - 10/27/21 1018     Clinical Impression Statement Pt showed levelled pelvic girdle and shoulder today which is good carry over.  Addressed hip abductor weakness today with clamshell and stretch. Pt required cues for technique. Progressed to deep core strengthening to help with motility and pelvic floor lengthening to improve bowel movements.   Discussed referral to nutritionist to help with bowel consistency.  Pt continues to benefit from skilled PT. Plan to reassess deep core coordination and assess pelvic floor at next session.   Personal Factors and Comorbidities Comorbidity 3+    Comorbidities Hodgkin Lymphoma in 2006 with chemo, radiation Tx, scoliosis    Stability/Clinical Decision Making Evolving/Moderate complexity    Rehab Potential Good    PT Frequency 1x / week    PT Duration Other (comment)   10   PT Treatment/Interventions Neuromuscular re-education;Balance training;Therapeutic exercise;Gait training;Stair training;Functional mobility training;Therapeutic activities;Splinting;Taping;Patient/family education;Scar mobilization;Dry needling;Manual techniques;Electrical Stimulation;Cryotherapy;Moist Heat;ADLs/Self Care Home Management;Traction;Energy conservation    Consulted and Agree with Plan of Care Patient             Patient will benefit from skilled therapeutic intervention in order to improve the following deficits and impairments:  Decreased activity tolerance, Decreased balance, Decreased coordination, Postural dysfunction, Increased muscle spasms, Improper body mechanics, Decreased range of motion, Decreased mobility, Pain, Decreased scar mobility, Decreased endurance, Hypomobility, Impaired tone, Increased edema, Decreased strength, Increased fascial restricitons, Decreased knowledge of use of DME, Hypermobility, Difficulty walking, Decreased safety awareness, Impaired flexibility, Impaired UE functional use, Decreased skin integrity, Cardiopulmonary status limiting  activity  Visit Diagnosis: Other idiopathic scoliosis, thoracolumbar region  Chronic right shoulder pain  Pain in right hip  Sacrococcygeal disorders, not elsewhere classified  Unspecified lack of coordination     Problem List Patient Active Problem List   Diagnosis Date Noted   Loose stools 10/15/2021   Cough 10/15/2021   Preventative health care 02/03/2021   Mood disorder ([place]) 02/03/2021   Atherosclerosis of aorta ([place]) 02/03/2021   Intermittent asthma, well controlled 07/31/2020   SVT (supraventricular tachycardia) ([facility])    Hyperlipidemia    Menopausal syndrome    OAB (overactive bladder)    Seasonal allergies    Lumbar radiculopathy 07/06/2020   History of Hodgkin's lymphoma 03/01/2017    Jerl Mina, PT 10/27/2021, 2:01 PM  [place] [address] [place], [place], [address] Phone: [phone]   Fax:  [phone]  Name: Andrea Noble MRN: [id number] Date of Birth: [birth year]

## 2021-11-01 ENCOUNTER — Encounter: Payer: Self-pay | Admitting: Physical Therapy

## 2021-11-01 ENCOUNTER — Ambulatory Visit: Payer: Medicare Other | Attending: Obstetrics and Gynecology | Admitting: Physical Therapy

## 2021-11-01 DIAGNOSIS — M4125 Other idiopathic scoliosis, thoracolumbar region: Secondary | ICD-10-CM | POA: Diagnosis present

## 2021-11-01 DIAGNOSIS — M25551 Pain in right hip: Secondary | ICD-10-CM | POA: Diagnosis present

## 2021-11-01 DIAGNOSIS — M533 Sacrococcygeal disorders, not elsewhere classified: Secondary | ICD-10-CM | POA: Insufficient documentation

## 2021-11-01 DIAGNOSIS — M25511 Pain in right shoulder: Secondary | ICD-10-CM | POA: Diagnosis present

## 2021-11-01 DIAGNOSIS — R279 Unspecified lack of coordination: Secondary | ICD-10-CM | POA: Insufficient documentation

## 2021-11-01 DIAGNOSIS — G8929 Other chronic pain: Secondary | ICD-10-CM | POA: Diagnosis present

## 2021-11-01 NOTE — Therapy (Signed)
OUTPATIENT PHYSICAL THERAPY TREATMENT NOTE   Patient Name: Andrea Noble MRN: [id number] DOB:03-28-1951, 71 y.o., female Today's Date: 11/01/2021   REFERRING PROVIDER:  Schermerhorn    PT End of Session - 11/01/21 1514     Visit Number 4    Number of Visits 10    Date for PT Re-Evaluation 11/22/21    PT Start Time 1508    PT Stop Time 1602    PT Time Calculation (min) 54 min    Activity Tolerance Patient tolerated treatment well    Behavior During Therapy [facility] for tasks assessed/performed             Past Medical History:  Diagnosis Date   Anxiety    Arthritis    Asthma    Cataract    Depression    Diverticulitis    GERD (gastroesophageal reflux disease)    Hodgkin's lymphoma ([place]) 2006   underwent radiation and chemo, last CT scan since moving to [place] from [place]   Hyperlipidemia    Hypothyroidism    Menopausal syndrome    OAB (overactive bladder)    Pneumonia    Seasonal allergies    SVT (supraventricular tachycardia) ([place])    Thyroid disease    hypothyroid   Past Surgical History:  Procedure Laterality Date   BLEPHAROPLASTY Bilateral    BREAST BIOPSY     benign   BREAST BIOPSY  07/27/2018   [place] core bx venus COLUMNAR CELL METAPLASIA, AND PSEUDOANGIOMATOUS STROMAL   CARPAL TUNNEL RELEASE Right    CARPAL TUNNEL RELEASE Left    CATARACT EXTRACTION Bilateral 2015   CLEFT LIP REPAIR     COLONOSCOPY WITH PROPOFOL N/A 07/09/2018   Procedure: COLONOSCOPY WITH PROPOFOL;  Surgeon: Lollie Sails, MD;  Location: [facility] ENDOSCOPY;  Service: Endoscopy;  Laterality: N/A;   DE QUERVAIN'S RELEASE Bilateral    DIAGNOSTIC LAPAROSCOPY     endometriosis   ESOPHAGOGASTRODUODENOSCOPY N/A 07/09/2018   Procedure: ESOPHAGOGASTRODUODENOSCOPY (EGD);  Surgeon: Lollie Sails, MD;  Location: [facility] ENDOSCOPY;  Service: Endoscopy;  Laterality: N/A;   FINGER SURGERY Right    Middle finger joint replaced   MAXIMUM ACCESS (MAS) TRANSFORAMINAL LUMBAR INTERBODY FUSION (TLIF) 2 LEVEL  Right 07/06/2020   Procedure: L4/5, L5/S1 TRANSFORAMINAL LUMBAR INTERBODY FUSION (TLIF), L4-S1 PEDICLE SCREWS,DECOMPRESSION ;  Surgeon: Deetta Perla, MD;  Location: [facility] ORS;  Service: Neurosurgery;  Laterality: Right;   THUMB SURGERY Right    took tendon from forearm and wrapped it around the thumb joint   Patient Active Problem List   Diagnosis Date Noted   Loose stools 10/15/2021   Cough 10/15/2021   Preventative health care 02/03/2021   Mood disorder ([place]) 02/03/2021   Atherosclerosis of aorta ([place]) 02/03/2021   Intermittent asthma, well controlled 07/31/2020   SVT (supraventricular tachycardia) (HCC)    Hyperlipidemia    Menopausal syndrome    OAB (overactive bladder)    Seasonal allergies    Lumbar radiculopathy 07/06/2020   History of Hodgkin's lymphoma 03/01/2017    REFERRING DIAG:             Urinary Urgency  THERAPY DIAG:  Other idiopathic scoliosis, thoracolumbar region  Chronic right shoulder pain  Pain in right hip  Sacrococcygeal disorders, not elsewhere classified  Unspecified lack of coordination  Rationale for Evaluation and Treatment Rehabilitation  PERTINENT HISTORY:    Sx reported on eval 09/13/21  1) urinary urgency/ fecal leakage: Sometimes pt has leakage 20% of the time.  Pt wear a pad as  a precaution. Pt feels the urge to urinate when going from sit to stand after sitting for 1-2 hours.  Pt would wake up and need to go to the bathroom and she when she gets to the toilet , she has soft stool with urination. Pt is taking Miralax Col[place] and she wants to take as minimal as possible. Pt has fecal leakage recently when taking the laxatives when sitting on the toilet. Pt has Hx of diverticulitis. Pt has not messed her pants.     2) LBP 1/10 today, at its worst 5/10,  located at B L/S junciton ( pt points to this area). pain is constant at low level. Started at least 20 years ago and she was on a step stool and reaching trying to paint and lost her balance and  felt onto her knee.  Plus pt also had skiing accident when she felt flat on her back. Pt underwent laminectomy surgery March 2022. Pt rteported she had  "screws." Pt had PT at [facility] where she lives. LBP occurs after period of standing > 30 min.      3) R hip bursitis started 2021 impacts her walking sometimes with going up stairs at home  with weight bearing .  Pt is not able to lie on her R side because of her R shoulder and R hip.      4) R shoulder pain for the past 5 years.  and limits with reaching behind her back and overheading.     Physical Walking: not able to walk daily  > 30 min but is not walking the distance she used to walk.   PRECAUTIONS:  fall risk   SUBJECTIVE:                 Subjective Assessment - 11/01/21 1515     Subjective pt reports her R shoulder pain has been very painful, she cried yesterday. She has had it for 10 years and went through PT and then had back surgery. Sometimes, there is pain that started at the elbow and goes up the inner upper arm. Pt has had pain after getting COVID booster in her L shoulder.  Pain is a 3/10 located over the R shoulder joint. Pt did her deep core exercises on her bed. Pt focused on increasing her water and eating more fruits and vegetables. Pt has better bowel movements with more Type 4 stool consistency.    Pertinent History Nocturia once a night and pt has a hard time going back to sleep. Pt feels fatigued lately like she did when she found out about Hodgkin Lymphoma in 2006. Pt is limited to sleeping on her back because she can not sleep on R shoulder due to R shoulder bursitis and uses a pilow between her knees. Husband gets up earlier than pt and that gets up rouses her awake.  Mild scoliosis, shoe lift in L    Patient Stated Goals not have urinary urgency and leakage. stepping off sidewalking without hurting.              PAIN:  Are you having pain? Yes:  3/10 over R shoulder joint   TODAY'S Assessment/  TREATMENT:    [facility] PT Assessment - 11/01/21 1542       Coordination   Coordination and Movement Description abdominal expansion with deep core coordination      AROM   Overall AROM Comments supine cervical sideflexion R 25 deg   L 35 deg R ,  rotation 65 deg R, 55 deg L .   post Tx: sideflexion R 30 deg, L 40 deg, rotation     Palpation   Palpation comment tightness along rhomboids, levator, upper trap R, hypomobile at C/T junction             [facility] Adult PT Treatment/Exercise - 11/01/21 1736       Neuro Re-ed    Neuro Re-ed Details  cued for cervical scapular stabilization HEP , reviewed deep core with cues for less belly explansion and more diaphragmatic excursion      Moist Heat Therapy   Number Minutes Moist Heat 5 Minutes    Moist Heat Location --   thoracic, cervical     Manual Therapy   Manual therapy comments STM/MWM at  rhomboids, upper trap, levator R                PATIENT EDUCATION: Education details: Explained muscles attachments/ connection, physiology of deep core system/ spinal- thoracic-pelvis-lower kinetic chain as they relate to pt's presentation, Sx, and past Hx. Explained what and how these areas of deficits need to be restored to balance and function   Person educated: Patient Education method: Explanation, Demonstration, Tactile cues, and Verbal cues Education comprehension: verbalized understanding, returned demonstration, verbal cues required, and tactile cues required   HOME EXERCISE PROGRAM: See pt instruction section       PT Long Term Goals - 11/01/21 1607       PT LONG TERM GOAL #1   Title Pt will report decreased urgency to urinate after 1.5 hours of travelling, going from sit to stand ,  and without altered gait to the bathroom without leakage  to demo improved continence and to visit step mom in [place]    Baseline urge to urinate after 1-2 hours and uses altered gait pattern    Time 6    Period Weeks    Status On-going    Target Date  10/25/21      PT LONG TERM GOAL #2   Title Pt will demo levelled pelvic girdle and shoulder and less scoliotic curve to progress to deep core HEP in order to improve postural stability for QOL    Baseline R shoudler and L iliac rest lower, convex T/L junction    Time 4    Period Weeks    Status Achieved    Target Date 10/11/21      PT LONG TERM GOAL #3   Title Pt will demo improved gait speed from 1.11 m/s to > 1.3 m/s and demo reciporcal gait pattern to progress to walking longer distance    Time 6    Status Partially Met    Target Date 10/25/21      PT LONG TERM GOAL #4   Title Pt will demo increased R hip LE from 4-/5 to 5/5  and no LBP with R sideflexion in order to sleep on R side with no R hip bursitis pain    Time 8    Period Weeks    Status On-going    Target Date 11/08/21      PT LONG TERM GOAL #5   Title Pt will improve bowel leakage on FOTO from 62 pt by > 5 pt point change in order to demo more control of sphincter when urinating    Time 10    Status On-going    Target Date 11/22/21      Additional Long Term Goals   Additional Long Term  Goals Yes      PT LONG TERM GOAL #6   Title Pt will report no difficulty / R shoulder pain when wiping for bowel movements    Time 5    Period Weeks    Status New    Target Date 12/06/21              Plan - 11/01/21 1739     Clinical Impression Statement Pt required minor cues for deep core training which was initiated at last session. Anticipate pt will improve her pelvic function with increased deep core strength with this exercise.   Pt is making positive improvements with compliance to more water and fiber intake with more regular bowel movements and Type 4 stool type.   Addressed her R shoulder pain today which makes it difficult for her to reach behind for cleaning her bowel movements. Pt showed increased hypomobility at C/T junction and limited sideflexion and rotation AROM which improved post Tx. Pt tolerated  manual Tx without complaints nor increased pain. Plan to initiate more cervical / scapular stabilization and strengthening to minimize forward head posture which will help minimize R shoulder pain and further improve her IAP system for pelvic function.   Pt continues to benefit from skilled PT.     Personal Factors and Comorbidities Comorbidity 3+    Comorbidities Hodgkin Lymphoma in 2006 with chemo, radiation Tx, scoliosis    Stability/Clinical Decision Making Evolving/Moderate complexity    Rehab Potential Good    PT Frequency 1x / week    PT Duration Other (comment)   10   PT Treatment/Interventions Neuromuscular re-education;Balance training;Therapeutic exercise;Gait training;Stair training;Functional mobility training;Therapeutic activities;Splinting;Taping;Patient/family education;Scar mobilization;Dry needling;Manual techniques;Electrical Stimulation;Cryotherapy;Moist Heat;ADLs/Self Care Home Management;Traction;Energy conservation    Consulted and Agree with Plan of Care Patient               Jerl Mina, PT 11/01/2021, 5:41 PM

## 2021-11-08 ENCOUNTER — Encounter: Payer: Medicare Other | Admitting: Physical Therapy

## 2021-11-09 ENCOUNTER — Ambulatory Visit: Payer: Medicare Other | Admitting: Physical Therapy

## 2021-11-09 DIAGNOSIS — M4125 Other idiopathic scoliosis, thoracolumbar region: Secondary | ICD-10-CM

## 2021-11-09 DIAGNOSIS — G8929 Other chronic pain: Secondary | ICD-10-CM

## 2021-11-09 DIAGNOSIS — M533 Sacrococcygeal disorders, not elsewhere classified: Secondary | ICD-10-CM

## 2021-11-09 DIAGNOSIS — R279 Unspecified lack of coordination: Secondary | ICD-10-CM

## 2021-11-09 DIAGNOSIS — M25551 Pain in right hip: Secondary | ICD-10-CM

## 2021-11-10 NOTE — Therapy (Signed)
OUTPATIENT PHYSICAL THERAPY TREATMENT NOTE   Patient Name: Andrea Noble MRN: [id number] DOB:02/15/1951, 70 y.o., female Today's Date: 11/10/2021   REFERRING PROVIDER:  Schermerhorn    PT End of Session - 11/09/21 1523     Visit Number 5    Number of Visits 10    Date for PT Re-Evaluation 11/22/21    PT Start Time 1522    PT Stop Time 1610    PT Time Calculation (min) 48 min    Activity Tolerance Patient tolerated treatment well    Behavior During Therapy WFL for tasks assessed/performed             Past Medical History:  Diagnosis Date   Anxiety    Arthritis    Asthma    Cataract    Depression    Diverticulitis    GERD (gastroesophageal reflux disease)    Hodgkin's lymphoma (HCC) 2006   underwent radiation and chemo, last CT scan since moving to [place] from [facility] 2018   Hyperlipidemia    Hypothyroidism    Menopausal syndrome    OAB (overactive bladder)    Pneumonia    Seasonal allergies    SVT (supraventricular tachycardia) (HCC)    Thyroid disease    hypothyroid   Past Surgical History:  Procedure Laterality Date   BLEPHAROPLASTY Bilateral    BREAST BIOPSY     benign   BREAST BIOPSY  07/27/2018   us core bx venus COLUMNAR CELL METAPLASIA, AND PSEUDOANGIOMATOUS STROMAL   CARPAL TUNNEL RELEASE Right    CARPAL TUNNEL RELEASE Left    CATARACT EXTRACTION Bilateral 2015   CLEFT LIP REPAIR     COLONOSCOPY WITH PROPOFOL N/A 07/09/2018   Procedure: COLONOSCOPY WITH PROPOFOL;  Surgeon: Skulskie, Martin U, MD;  Location: [facility] ENDOSCOPY;  Service: Endoscopy;  Laterality: N/A;   DE QUERVAIN'S RELEASE Bilateral    DIAGNOSTIC LAPAROSCOPY     endometriosis   ESOPHAGOGASTRODUODENOSCOPY N/A 07/09/2018   Procedure: ESOPHAGOGASTRODUODENOSCOPY (EGD);  Surgeon: Skulskie, Martin U, MD;  Location: [facility] ENDOSCOPY;  Service: Endoscopy;  Laterality: N/A;   FINGER SURGERY Right    Middle finger joint replaced   MAXIMUM ACCESS (MAS) TRANSFORAMINAL LUMBAR INTERBODY FUSION (TLIF) 2 LEVEL  Right 07/06/2020   Procedure: L4/5, L5/S1 TRANSFORAMINAL LUMBAR INTERBODY FUSION (TLIF), L4-S1 PEDICLE SCREWS,DECOMPRESSION ;  Surgeon: Cook, Steven, MD;  Location: [facility] ORS;  Service: Neurosurgery;  Laterality: Right;   THUMB SURGERY Right    took tendon from forearm and wrapped it around the thumb joint   Patient Active Problem List   Diagnosis Date Noted   Loose stools 10/15/2021   Cough 10/15/2021   Preventative health care 02/03/2021   Mood disorder (HCC) 02/03/2021   Atherosclerosis of aorta (HCC) 02/03/2021   Intermittent asthma, well controlled 07/31/2020   SVT (supraventricular tachycardia) (HCC)    Hyperlipidemia    Menopausal syndrome    OAB (overactive bladder)    Seasonal allergies    Lumbar radiculopathy 07/06/2020   History of Hodgkin's lymphoma 03/01/2017    REFERRING DIAG:             Urinary Urgency  THERAPY DIAG:  Other idiopathic scoliosis, thoracolumbar region  Chronic right shoulder pain  Pain in right hip  Unspecified lack of coordination  Sacrococcygeal disorders, not elsewhere classified  Rationale for Evaluation and Treatment Rehabilitation  PERTINENT HISTORY:    Sx reported on eval 09/13/21  1) urinary urgency/ fecal leakage: Sometimes pt has leakage 20% of the time.  Pt wear a pad as   a precaution. Pt feels the urge to urinate when going from sit to stand after sitting for 1-2 hours.  Pt would wake up and need to go to the bathroom and she when she gets to the toilet , she has soft stool with urination. Pt is taking Miralax Colace and she wants to take as minimal as possible. Pt has fecal leakage recently when taking the laxatives when sitting on the toilet. Pt has Hx of diverticulitis. Pt has not messed her pants.     2) LBP 1/10 today, at its worst 5/10,  located at B L/S junciton ( pt points to this area). pain is constant at low level. Started at least 20 years ago and she was on a step stool and reaching trying to paint and lost her balance and  felt onto her knee.  Plus pt also had skiing accident when she felt flat on her back. Pt underwent laminectomy surgery March 2022. Pt rteported she had  "screws." Pt had PT at [facility] where she lives. LBP occurs after period of standing > 30 min.      3) R hip bursitis started 2021 impacts her walking sometimes with going up stairs at home  with weight bearing .  Pt is not able to lie on her R side because of her R shoulder and R hip.      4) R shoulder pain for the past 5 years.  and limits with reaching behind her back and overheading.     Physical Walking: not able to walk daily  > 30 min but is not walking the distance she used to walk.   PRECAUTIONS:  fall risk   SUBJECTIVE:                            Pt is practicing to keep her head and shoulders back.      TODAY'S Assessment/  TREATMENT:   [facility] PT Assessment - 11/10/21 1037       Posture/Postural Control   Posture Comments less hypomobility at C/T junction, less thoracic kyphosis, less rounded shoulders      AROM   Overall AROM Comments supine cervical sideflexion B 35 deg ,  rotation 70 deg R, 60 deg L .      Palpation   Spinal mobility tightness along L medial scap, levator, upper trap L , intercostals L             [facility] Adult PT Treatment/Exercise - 11/10/21 1037       Neuro Re-ed    Neuro Re-ed Details  cued for upright posture, less forward head, decreased upper trap use for optimal deep core      Modalities   Modalities Moist Heat      Moist Heat Therapy   Number Minutes Moist Heat 5 Minutes   ( 3 min unbilled, 2 min with guided relaxation)   Moist Heat Location --   slight L trunk rotation to lengthen L side, guided relaxation     Manual Therapy   Manual therapy comments STM/MWM at problem areas noted in assessment to promote less forward head posture, optimize IAP system                PATIENT EDUCATION: Education details: Explained muscles attachments/ connection, physiology of deep core  system/ spinal- thoracic-pelvis-lower kinetic chain as they relate to pt's presentation, Sx, and past Hx. Explained what and how these areas of deficits need  to be restored to balance and function   Person educated: Patient Education method: Explanation, Demonstration, Tactile cues, and Verbal cues Education comprehension: verbalized understanding, returned demonstration, verbal cues required, and tactile cues required   HOME EXERCISE PROGRAM: See pt instruction section       PT Long Term Goals - 11/01/21 1607       PT LONG TERM GOAL #1   Title Pt will report decreased urgency to urinate after 1.5 hours of travelling, going from sit to stand ,  and without altered gait to the bathroom without leakage  to demo improved continence and to visit step mom in [place]    Baseline urge to urinate after 1-2 hours and uses altered gait pattern    Time 6    Period Weeks    Status On-going    Target Date 10/25/21      PT LONG TERM GOAL #2   Title Pt will demo levelled pelvic girdle and shoulder and less scoliotic curve to progress to deep core HEP in order to improve postural stability for QOL    Baseline R shoudler and L iliac rest lower, convex T/L junction    Time 4    Period Weeks    Status Achieved    Target Date 10/11/21      PT LONG TERM GOAL #3   Title Pt will demo improved gait speed from 1.11 m/s to > 1.3 m/s and demo reciporcal gait pattern to progress to walking longer distance    Time 6    Status Partially Met    Target Date 10/25/21      PT LONG TERM GOAL #4   Title Pt will demo increased R hip LE from 4-/5 to 5/5  and no LBP with R sideflexion in order to sleep on R side with no R hip bursitis pain    Time 8    Period Weeks    Status On-going    Target Date 11/08/21      PT LONG TERM GOAL #5   Title Pt will improve bowel leakage on FOTO from 62 pt by > 5 pt point change in order to demo more control of sphincter when urinating    Time 10    Status On-going     Target Date 11/22/21      Additional Long Term Goals   Additional Long Term Goals Yes      PT LONG TERM GOAL #6   Title Pt will report no difficulty / R shoulder pain when wiping for bowel movements    Time 5    Period Weeks    Status New    Target Date 12/06/21              Plan     Clinical Impression Statement  Pt showed improved posture with less rounded shoulders and forward head. Pt still required manual Tx to address tightness at L thoracic/ scapular area. Modified pressure and technique with manual Tx when pt expressed tenderness and pt tolerated Tx without complains with these modifications. Anticipate today's Tx will help to  further improve her IAP system for pelvic function and decrease shoulder pain. Added cervical/ scapular retraction in gravity eliminated position and provided cues for technique. Plan to add resistance band for cervical/ scapular strengthening and assess pelvic floor at upcoming sessions.    Pt continues to benefit from skilled PT.       Personal Factors and Comorbidities Comorbidity 3+    Comorbidities Hodgkin   Lymphoma in 2006 with chemo, radiation Tx, scoliosis    Stability/Clinical Decision Making Evolving/Moderate complexity    Rehab Potential Good    PT Frequency 1x / week    PT Duration Other (comment)   10   PT Treatment/Interventions Neuromuscular re-education;Balance training;Therapeutic exercise;Gait training;Stair training;Functional mobility training;Therapeutic activities;Splinting;Taping;Patient/family education;Scar mobilization;Dry needling;Manual techniques;Electrical Stimulation;Cryotherapy;Moist Heat;ADLs/Self Care Home Management;Traction;Energy conservation    Consulted and Agree with Plan of Care Patient               Jerl Mina, PT 11/10/2021, 10:43 AM

## 2021-11-15 ENCOUNTER — Encounter: Payer: Medicare Other | Admitting: Physical Therapy

## 2021-11-16 ENCOUNTER — Ambulatory Visit: Payer: Medicare Other | Admitting: Physical Therapy

## 2021-11-16 DIAGNOSIS — M4125 Other idiopathic scoliosis, thoracolumbar region: Secondary | ICD-10-CM | POA: Diagnosis not present

## 2021-11-16 DIAGNOSIS — M25551 Pain in right hip: Secondary | ICD-10-CM

## 2021-11-16 DIAGNOSIS — G8929 Other chronic pain: Secondary | ICD-10-CM

## 2021-11-16 DIAGNOSIS — M533 Sacrococcygeal disorders, not elsewhere classified: Secondary | ICD-10-CM

## 2021-11-16 DIAGNOSIS — R279 Unspecified lack of coordination: Secondary | ICD-10-CM

## 2021-11-16 NOTE — Therapy (Signed)
OUTPATIENT PHYSICAL THERAPY TREATMENT NOTE   Patient Name: Andrea Noble MRN: [id number] DOB:04-Dec-1950, 71 y.o., female Today's Date: 11/16/2021   REFERRING PROVIDER:  Schermerhorn    PT End of Session - 11/16/21 1729     Visit Number 6    Date for PT Re-Evaluation 11/22/21    PT Start Time 1510    PT Stop Time 4356    PT Time Calculation (min) 35 min    Activity Tolerance Patient tolerated treatment well    Behavior During Therapy [facility] for tasks assessed/performed             Past Medical History:  Diagnosis Date   Anxiety    Arthritis    Asthma    Cataract    Depression    Diverticulitis    GERD (gastroesophageal reflux disease)    Hodgkin's lymphoma ([place]) 2006   underwent radiation and chemo, last CT scan since moving to [place] from [place]   Hyperlipidemia    Hypothyroidism    Menopausal syndrome    OAB (overactive bladder)    Pneumonia    Seasonal allergies    SVT (supraventricular tachycardia) ([place])    Thyroid disease    hypothyroid   Past Surgical History:  Procedure Laterality Date   BLEPHAROPLASTY Bilateral    BREAST BIOPSY     benign   BREAST BIOPSY  07/27/2018   [place] core bx venus COLUMNAR CELL METAPLASIA, AND PSEUDOANGIOMATOUS STROMAL   CARPAL TUNNEL RELEASE Right    CARPAL TUNNEL RELEASE Left    CATARACT EXTRACTION Bilateral 2015   CLEFT LIP REPAIR     COLONOSCOPY WITH PROPOFOL N/A 07/09/2018   Procedure: COLONOSCOPY WITH PROPOFOL;  Surgeon: Lollie Sails, MD;  Location: [facility] ENDOSCOPY;  Service: Endoscopy;  Laterality: N/A;   DE QUERVAIN'S RELEASE Bilateral    DIAGNOSTIC LAPAROSCOPY     endometriosis   ESOPHAGOGASTRODUODENOSCOPY N/A 07/09/2018   Procedure: ESOPHAGOGASTRODUODENOSCOPY (EGD);  Surgeon: Lollie Sails, MD;  Location: [facility] ENDOSCOPY;  Service: Endoscopy;  Laterality: N/A;   FINGER SURGERY Right    Middle finger joint replaced   MAXIMUM ACCESS (MAS) TRANSFORAMINAL LUMBAR INTERBODY FUSION (TLIF) 2 LEVEL Right 07/06/2020    Procedure: L4/5, L5/S1 TRANSFORAMINAL LUMBAR INTERBODY FUSION (TLIF), L4-S1 PEDICLE SCREWS,DECOMPRESSION ;  Surgeon: Deetta Perla, MD;  Location: [facility] ORS;  Service: Neurosurgery;  Laterality: Right;   THUMB SURGERY Right    took tendon from forearm and wrapped it around the thumb joint   Patient Active Problem List   Diagnosis Date Noted   Loose stools 10/15/2021   Cough 10/15/2021   Preventative health care 02/03/2021   Mood disorder ([place]) 02/03/2021   Atherosclerosis of aorta ([place]) 02/03/2021   Intermittent asthma, well controlled 07/31/2020   SVT (supraventricular tachycardia) (HCC)    Hyperlipidemia    Menopausal syndrome    OAB (overactive bladder)    Seasonal allergies    Lumbar radiculopathy 07/06/2020   History of Hodgkin's lymphoma 03/01/2017    REFERRING DIAG:             Urinary Urgency  THERAPY DIAG:  Other idiopathic scoliosis, thoracolumbar region  Chronic right shoulder pain  Pain in right hip  Unspecified lack of coordination  Sacrococcygeal disorders, not elsewhere classified  Rationale for Evaluation and Treatment Rehabilitation  PERTINENT HISTORY:    Sx reported on eval 09/13/21  1) urinary urgency/ fecal leakage: Sometimes pt has leakage 20% of the time.  Pt wear a pad as a precaution. Pt feels the urge to  urinate when going from sit to stand after sitting for 1-2 hours.  Pt would wake up and need to go to the bathroom and she when she gets to the toilet , she has soft stool with urination. Pt is taking Miralax Colace and she wants to take as minimal as possible. Pt has fecal leakage recently when taking the laxatives when sitting on the toilet. Pt has Hx of diverticulitis. Pt has not messed her pants.     2) LBP 1/10 today, at its worst 5/10,  located at B L/S junciton ( pt points to this area). pain is constant at low level. Started at least 20 years ago and she was on a step stool and reaching trying to paint and lost her balance and felt onto her knee.   Plus pt also had skiing accident when she felt flat on her back. Pt underwent laminectomy surgery March 2022. Pt rteported she had  "screws." Pt had PT at [facility] where she lives. LBP occurs after period of standing > 30 min.      3) R hip bursitis started 2021 impacts her walking sometimes with going up stairs at home  with weight bearing .  Pt is not able to lie on her R side because of her R shoulder and R hip.      4) R shoulder pain for the past 5 years.  and limits with reaching behind her back and overheading.     Physical Walking: not able to walk daily  > 30 min but is not walking the distance she used to walk.   PRECAUTIONS:  fall risk   SUBJECTIVE:                            Pt reports soreness in her L shoulder after session but soreness went away. The R shoulder has been bothering from using a new laptop this morning. The e is radiating pain to her elbow like it has in the past.      TODAY'S Assessment/  TREATMENT:   [facility] PT Assessment - 11/16/21 1520       Coordination   Coordination and Movement Description shoulder abduction with pain at the elbow, adduction with pain at the shoulder, no limited ROM but upper trap is dominant on R      AROM   Overall AROM Comments shoulder flex L 140 deg, 120 deg with pain on R  shoulder and above elbow ( post Tx: 115 deg without pain but requried cues for scap / cerv retraction),   reach behind back: L thumb at T3, R at T1 at supraspinous of R shoulder but no signficnat pain. Reach behind back with L T 7, R no able ot do due to pain   post     Strength   Overall Strength Comments shoulder assessment: shoulder ext/ER/ ext 3+/5 with above elbow pain , L 5/5 no pain      Palpation   Palpation comment tightness along supraspinous, intercostals, serratus anterior, low trap R , hypomobile T4-7              [facility] Adult PT Treatment/Exercise - 11/16/21 1734       Manual Therapy   Manual therapy comments STM/MWM at problem areas to  promote posterior rotation of thoracic, scapular retraction . cervical retraction                 PATIENT EDUCATION: Education details:  Explained muscles attachments/ connection, physiology of deep core system/ spinal- thoracic-pelvis-lower kinetic chain as they relate to pt's presentation, Sx, and past Hx. Explained what and how these areas of deficits need to be restored to balance and function   Person educated: Patient Education method: Explanation, Demonstration, Tactile cues, and Verbal cues Education comprehension: verbalized understanding, returned demonstration, verbal cues required, and tactile cues required   HOME EXERCISE PROGRAM: See pt instruction section       PT Long Term Goals - 11/01/21 1607       PT LONG TERM GOAL #1   Title Pt will report decreased urgency to urinate after 1.5 hours of travelling, going from sit to stand ,  and without altered gait to the bathroom without leakage  to demo improved continence and to visit step mom in [place]    Baseline urge to urinate after 1-2 hours and uses altered gait pattern    Time 6    Period Weeks    Status On-going    Target Date 10/25/21      PT LONG TERM GOAL #2   Title Pt will demo levelled pelvic girdle and shoulder and less scoliotic curve to progress to deep core HEP in order to improve postural stability for QOL    Baseline R shoudler and L iliac rest lower, convex T/L junction    Time 4    Period Weeks    Status Achieved    Target Date 10/11/21      PT LONG TERM GOAL #3   Title Pt will demo improved gait speed from 1.11 m/s to > 1.3 m/s and demo reciporcal gait pattern to progress to walking longer distance    Time 6    Status Partially Met    Target Date 10/25/21      PT LONG TERM GOAL #4   Title Pt will demo increased R hip LE from 4-/5 to 5/5  and no LBP with R sideflexion in order to sleep on R side with no R hip bursitis pain    Time 8    Period Weeks    Status On-going    Target  Date 11/08/21      PT LONG TERM GOAL #5   Title Pt will improve bowel leakage on FOTO from 62 pt by > 5 pt point change in order to demo more control of sphincter when urinating    Time 10    Status On-going    Target Date 11/22/21      Additional Long Term Goals   Additional Long Term Goals Yes      PT LONG TERM GOAL #6   Title Pt will report no difficulty / R shoulder pain when wiping for bowel movements    Time 5    Period Weeks    Status New    Target Date 12/06/21              Plan     Clinical Impression Statement  Patient arrived 10 minutes late.  Therefore session was abbreviated.  Mainly focused on manual treatment to continue addressing forward head and thoracic kyphosis deficits.  Mobility was regained at the upper thoracic spine and intercostals on the right upper quadrant.  Patient demonstrated no pain with shoulder flexion when cued for scapular stabilization and cervical retraction after treatment.  Patient's R shoulder extension strength showed improvement as well as decreased pain posttreatment.  Patient required cues to rotate thoracic segments prior to use cervical spine.  Plan to continue training proprioception and sequencing thoracic mobility prior to cervical mobility in order to minimize forward head position.  Continue to reassess right upper quadrant to address her right shoulder pain.   Pt continues to benefit from skilled PT.       Personal Factors and Comorbidities Comorbidity 3+    Comorbidities Hodgkin Lymphoma in 2006 with chemo, radiation Tx, scoliosis    Stability/Clinical Decision Making Evolving/Moderate complexity    Rehab Potential Good    PT Frequency 1x / week    PT Duration Other (comment)   10   PT Treatment/Interventions Neuromuscular re-education;Balance training;Therapeutic exercise;Gait training;Stair training;Functional mobility training;Therapeutic activities;Splinting;Taping;Patient/family education;Scar mobilization;Dry  needling;Manual techniques;Electrical Stimulation;Cryotherapy;Moist Heat;ADLs/Self Care Home Management;Traction;Energy conservation    Consulted and Agree with Plan of Care Patient               Jerl Mina, PT 11/16/2021, 5:34 PM

## 2021-11-23 ENCOUNTER — Ambulatory Visit: Payer: Medicare Other | Admitting: Physical Therapy

## 2021-11-23 DIAGNOSIS — M4125 Other idiopathic scoliosis, thoracolumbar region: Secondary | ICD-10-CM | POA: Diagnosis not present

## 2021-11-23 DIAGNOSIS — M533 Sacrococcygeal disorders, not elsewhere classified: Secondary | ICD-10-CM

## 2021-11-23 DIAGNOSIS — M25551 Pain in right hip: Secondary | ICD-10-CM

## 2021-11-23 DIAGNOSIS — R279 Unspecified lack of coordination: Secondary | ICD-10-CM

## 2021-11-23 DIAGNOSIS — G8929 Other chronic pain: Secondary | ICD-10-CM

## 2021-11-23 NOTE — Patient Instructions (Signed)
  kitchen counter stretches  Hands on kitchen counter,   Palms shoulder width apart  Minisquat postion __knees bent never locked  Trunk is parallel to floor  A) Pull buttocks back to lengthen spine, knees bent  3 breaths   B) Bring R hand to the L, and stretch the R side trunk  3 breaths   Brings hands to center again Do the same to the L side stretch by placing L hand on top of R   D) Modified thread the needle R hand on L thigh, L  thigh pushing out slightly as the R hands pull in,  elbow bent and pulls to theR,  Look under L armpit   Do the same to other side  ____

## 2021-11-23 NOTE — Therapy (Signed)
OUTPATIENT PHYSICAL THERAPY TREATMENT NOTE / Recert   Patient Name: Andrea Noble MRN: [id number] DOB:1950/07/11, 71 y.o., female Today's Date: 11/23/2021   REFERRING PROVIDER:  Schermerhorn   PT End of Session - 11/23/21 1516     Visit Number 7    Date for PT Re-Evaluation 02/01/2022    PT Start Time 8841    PT Stop Time 6606    PT Time Calculation (min) 39 min    Activity Tolerance Patient tolerated treatment well    Behavior During Therapy [facility] for tasks assessed/performed                  Past Medical History:  Diagnosis Date   Anxiety    Arthritis    Asthma    Cataract    Depression    Diverticulitis    GERD (gastroesophageal reflux disease)    Hodgkin's lymphoma ([place]) 2006   underwent radiation and chemo, last CT scan since moving to [place] from [place]   Hyperlipidemia    Hypothyroidism    Menopausal syndrome    OAB (overactive bladder)    Pneumonia    Seasonal allergies    SVT (supraventricular tachycardia) (HCC)    Thyroid disease    hypothyroid   Past Surgical History:  Procedure Laterality Date   BLEPHAROPLASTY Bilateral    BREAST BIOPSY     benign   BREAST BIOPSY  07/27/2018   [place] core bx venus COLUMNAR CELL METAPLASIA, AND PSEUDOANGIOMATOUS STROMAL   CARPAL TUNNEL RELEASE Right    CARPAL TUNNEL RELEASE Left    CATARACT EXTRACTION Bilateral 2015   CLEFT LIP REPAIR     COLONOSCOPY WITH PROPOFOL N/A 07/09/2018   Procedure: COLONOSCOPY WITH PROPOFOL;  Surgeon: Lollie Sails, MD;  Location: [facility] ENDOSCOPY;  Service: Endoscopy;  Laterality: N/A;   DE QUERVAIN'S RELEASE Bilateral    DIAGNOSTIC LAPAROSCOPY     endometriosis   ESOPHAGOGASTRODUODENOSCOPY N/A 07/09/2018   Procedure: ESOPHAGOGASTRODUODENOSCOPY (EGD);  Surgeon: Lollie Sails, MD;  Location: [facility] ENDOSCOPY;  Service: Endoscopy;  Laterality: N/A;   FINGER SURGERY Right    Middle finger joint replaced   MAXIMUM ACCESS (MAS) TRANSFORAMINAL LUMBAR INTERBODY FUSION (TLIF) 2 LEVEL Right  07/06/2020   Procedure: L4/5, L5/S1 TRANSFORAMINAL LUMBAR INTERBODY FUSION (TLIF), L4-S1 PEDICLE SCREWS,DECOMPRESSION ;  Surgeon: Deetta Perla, MD;  Location: [facility] ORS;  Service: Neurosurgery;  Laterality: Right;   THUMB SURGERY Right    took tendon from forearm and wrapped it around the thumb joint   Patient Active Problem List   Diagnosis Date Noted   Loose stools 10/15/2021   Cough 10/15/2021   Preventative health care 02/03/2021   Mood disorder ([place]) 02/03/2021   Atherosclerosis of aorta ([place]) 02/03/2021   Intermittent asthma, well controlled 07/31/2020   SVT (supraventricular tachycardia) (HCC)    Hyperlipidemia    Menopausal syndrome    OAB (overactive bladder)    Seasonal allergies    Lumbar radiculopathy 07/06/2020   History of Hodgkin's lymphoma 03/01/2017    REFERRING DIAG:             Urinary Urgency  THERAPY DIAG:  Other idiopathic scoliosis, thoracolumbar region  Chronic right shoulder pain  Pain in right hip  Unspecified lack of coordination  Sacrococcygeal disorders, not elsewhere classified  Rationale for Evaluation and Treatment Rehabilitation  PERTINENT HISTORY:    Sx reported on eval 09/13/21  1) urinary urgency/ fecal leakage: Sometimes pt has leakage 20% of the time.  Pt wear a pad as a  precaution. Pt feels the urge to urinate when going from sit to stand after sitting for 1-2 hours.  Pt would wake up and need to go to the bathroom and she when she gets to the toilet , she has soft stool with urination. Pt is taking Miralax Colace and she wants to take as minimal as possible. Pt has fecal leakage recently when taking the laxatives when sitting on the toilet. Pt has Hx of diverticulitis. Pt has not messed her pants.     2) LBP 1/10 today, at its worst 5/10,  located at B L/S junciton ( pt points to this area). pain is constant at low level. Started at least 20 years ago and she was on a step stool and reaching trying to paint and lost her balance and felt  onto her knee.  Plus pt also had skiing accident when she felt flat on her back. Pt underwent laminectomy surgery March 2022. Pt rteported she had  "screws." Pt had PT at [facility] where she lives. LBP occurs after period of standing > 30 min.      3) R hip bursitis started 2021 impacts her walking sometimes with going up stairs at home  with weight bearing .  Pt is not able to lie on her R side because of her R shoulder and R hip.      4) R shoulder pain for the past 5 years.  and limits with reaching behind her back and overheading.     Physical Walking: not able to walk daily  > 30 min but is not walking the distance she used to walk.   PRECAUTIONS:  fall risk   SUBJECTIVE:                            Pt reports soreness in her L low back from bending forward a lot to apply medication and clip her ingrown toe nail on R foot. Pain is 5/10               Pt has no leakage except one time because she waited too long.      TODAY'S Assessment/  TREATMENT:   [facility] PT Assessment - 11/23/21 1519       Palpation   SI assessment  levelled pelvic girdle    Palpation comment increased mm tightness at L paraspinals from T/L junction to iliac crest               [facility] PT Assessment - 11/23/21 1519       Palpation   SI assessment  levelled pelvic girdle    Palpation comment increased mm tightness at L paraspinals from T/L junction to iliac crest , L lateral lower kinetic chain         [facility] Adult PT Treatment/Exercise - 11/23/21 1546       Neuro Re-ed    Neuro Re-ed Details  cued for stretches for low back and paraspinals L      Moist Heat Therapy   Number Minutes Moist Heat 15 Minutes    Moist Heat Location --   on back duration of treatment     Manual Therapy   Manual therapy comments STM/MWM at problem areas noted in assessment to minimize mm spasm                  PATIENT EDUCATION: Education details: Explained muscles attachments/ connection, physiology of deep  core system/ spinal-  thoracic-pelvis-lower kinetic chain as they relate to pt's presentation, Sx, and past Hx. Explained what and how these areas of deficits need to be restored to balance and function   Person educated: Patient Education method: Explanation, Demonstration, Tactile cues, and Verbal cues Education comprehension: verbalized understanding, returned demonstration, verbal cues required, and tactile cues required   HOME EXERCISE PROGRAM: See pt instruction section    PT Long Term Goals - 11/23/21 1722       PT LONG TERM GOAL #1   Title Pt will report decreased urgency to urinate after 1.5 hours of travelling, going from sit to stand ,  and without altered gait to the bathroom without leakage  to demo improved continence and to visit step mom in [place]    Baseline urge to urinate after 1-2 hours and uses altered gait pattern    Time 6    Period Weeks    Status On-going    Target Date 01/04/22      PT LONG TERM GOAL #2   Title Pt will demo levelled pelvic girdle and shoulder and less scoliotic curve to progress to deep core HEP in order to improve postural stability for QOL    Baseline R shoudler and L iliac rest lower, convex T/L junction    Time 4    Period Weeks    Status Achieved    Target Date 10/11/21      PT LONG TERM GOAL #3   Title Pt will demo improved gait speed from 1.11 m/s to > 1.3 m/s and demo reciporcal gait pattern to progress to walking longer distance    Time 6    Status Partially Met    Target Date 01/04/22      PT LONG TERM GOAL #4   Title Pt will demo increased R hip LE from 4-/5 to 5/5  and no LBP with R sideflexion in order to sleep on R side with no R hip bursitis pain    Time 8    Period Weeks    Status On-going    Target Date 01/18/22      PT LONG TERM GOAL #5   Title Pt will improve bowel leakage on FOTO from 62 pt by > 5 pt point change in order to demo more control of sphincter when urinating    Time 10    Status On-going     Target Date 02/01/22      PT LONG TERM GOAL #6   Title Pt will report no difficulty / R shoulder pain when wiping for bowel movements    Time 5    Period Weeks    Status On-going    Target Date 12/06/21                   Plan     Clinical Impression Statement Pt is progressing well towards her goals.  Pt demonstrated improved upright posture, more equally aligned spine and pelvic girdle.    Today, patient had a mm spasm with L back mm after bending forward to care for ingrown toe nail.   Pt had 5/10 pain today and demo'd tightness along paraspinals / lower kinetic chain on L side. Manunal Tx decreased the pain to 3/10.   Pt continues to benefit from skilled PT.  Plan to address lower kinetic chain at next session to improve gait and minimize pelvic floor overactivity.  Plan to add more posterior mm strengthening ( cervical / scapular stabilization)  Personal Factors and Comorbidities Comorbidity 3+    Comorbidities Hodgkin Lymphoma in 2006 with chemo, radiation Tx, scoliosis    Stability/Clinical Decision Making Evolving/Moderate complexity    Rehab Potential Good    PT Frequency 1x / week    PT Duration Other (comment)   10   PT Treatment/Interventions Neuromuscular re-education;Balance training;Therapeutic exercise;Gait training;Stair training;Functional mobility training;Therapeutic activities;Splinting;Taping;Patient/family education;Scar mobilization;Dry needling;Manual techniques;Electrical Stimulation;Cryotherapy;Moist Heat;ADLs/Self Care Home Management;Traction;Energy conservation    Consulted and Agree with Plan of Care Patient               Jerl Mina, PT 11/23/2021, 5:24 PM

## 2021-11-29 ENCOUNTER — Encounter: Payer: Medicare Other | Admitting: Physical Therapy

## 2021-11-30 ENCOUNTER — Ambulatory Visit: Payer: Medicare Other | Attending: Obstetrics and Gynecology | Admitting: Physical Therapy

## 2021-11-30 DIAGNOSIS — R279 Unspecified lack of coordination: Secondary | ICD-10-CM | POA: Diagnosis present

## 2021-11-30 DIAGNOSIS — M25551 Pain in right hip: Secondary | ICD-10-CM

## 2021-11-30 DIAGNOSIS — M25511 Pain in right shoulder: Secondary | ICD-10-CM | POA: Diagnosis present

## 2021-11-30 DIAGNOSIS — G8929 Other chronic pain: Secondary | ICD-10-CM | POA: Diagnosis present

## 2021-11-30 DIAGNOSIS — M4125 Other idiopathic scoliosis, thoracolumbar region: Secondary | ICD-10-CM

## 2021-11-30 DIAGNOSIS — M533 Sacrococcygeal disorders, not elsewhere classified: Secondary | ICD-10-CM | POA: Diagnosis present

## 2021-11-30 NOTE — Therapy (Addendum)
OUTPATIENT PHYSICAL THERAPY TREATMENT NOTE    Patient Name: Andrea Noble MRN: [id number] DOB:1951/04/22, 71 y.o., female Today's Date: 11/30/2021   REFERRING PROVIDER:  Schermerhorn   PT End of Session - 11/23/21 1516     Visit Number 8    Date for PT Re-Evaluation 02/01/2022    PT Start Time 0017    PT Stop Time 4944    PT Time Calculation (min) 39 min    Activity Tolerance Patient tolerated treatment well    Behavior During Therapy [facility] for tasks assessed/performed                  Past Medical History:  Diagnosis Date   Anxiety    Arthritis    Asthma    Cataract    Depression    Diverticulitis    GERD (gastroesophageal reflux disease)    Hodgkin's lymphoma ([place]) 2006   underwent radiation and chemo, last CT scan since moving to [place] from [place]   Hyperlipidemia    Hypothyroidism    Menopausal syndrome    OAB (overactive bladder)    Pneumonia    Seasonal allergies    SVT (supraventricular tachycardia) ([place])    Thyroid disease    hypothyroid   Past Surgical History:  Procedure Laterality Date   BLEPHAROPLASTY Bilateral    BREAST BIOPSY     benign   BREAST BIOPSY  07/27/2018   [place] core bx venus COLUMNAR CELL METAPLASIA, AND PSEUDOANGIOMATOUS STROMAL   CARPAL TUNNEL RELEASE Right    CARPAL TUNNEL RELEASE Left    CATARACT EXTRACTION Bilateral 2015   CLEFT LIP REPAIR     COLONOSCOPY WITH PROPOFOL N/A 07/09/2018   Procedure: COLONOSCOPY WITH PROPOFOL;  Surgeon: Lollie Sails, MD;  Location: [facility] ENDOSCOPY;  Service: Endoscopy;  Laterality: N/A;   DE QUERVAIN'S RELEASE Bilateral    DIAGNOSTIC LAPAROSCOPY     endometriosis   ESOPHAGOGASTRODUODENOSCOPY N/A 07/09/2018   Procedure: ESOPHAGOGASTRODUODENOSCOPY (EGD);  Surgeon: Lollie Sails, MD;  Location: [facility] ENDOSCOPY;  Service: Endoscopy;  Laterality: N/A;   FINGER SURGERY Right    Middle finger joint replaced   MAXIMUM ACCESS (MAS) TRANSFORAMINAL LUMBAR INTERBODY FUSION (TLIF) 2 LEVEL Right 07/06/2020    Procedure: L4/5, L5/S1 TRANSFORAMINAL LUMBAR INTERBODY FUSION (TLIF), L4-S1 PEDICLE SCREWS,DECOMPRESSION ;  Surgeon: Deetta Perla, MD;  Location: [facility] ORS;  Service: Neurosurgery;  Laterality: Right;   THUMB SURGERY Right    took tendon from forearm and wrapped it around the thumb joint   Patient Active Problem List   Diagnosis Date Noted   Loose stools 10/15/2021   Cough 10/15/2021   Preventative health care 02/03/2021   Mood disorder ([place]) 02/03/2021   Atherosclerosis of aorta ([place]) 02/03/2021   Intermittent asthma, well controlled 07/31/2020   SVT (supraventricular tachycardia) (HCC)    Hyperlipidemia    Menopausal syndrome    OAB (overactive bladder)    Seasonal allergies    Lumbar radiculopathy 07/06/2020   History of Hodgkin's lymphoma 03/01/2017    REFERRING DIAG:             Urinary Urgency  THERAPY DIAG:  Chronic right shoulder pain  Other idiopathic scoliosis, thoracolumbar region  Pain in right hip  Unspecified lack of coordination  Sacrococcygeal disorders, not elsewhere classified  Rationale for Evaluation and Treatment Rehabilitation  PERTINENT HISTORY:    Sx reported on eval 09/13/21  1) urinary urgency/ fecal leakage: Sometimes pt has leakage 20% of the time.  Pt wear a pad as a precaution.  Pt feels the urge to urinate when going from sit to stand after sitting for 1-2 hours.  Pt would wake up and need to go to the bathroom and she when she gets to the toilet , she has soft stool with urination. Pt is taking Miralax Colace and she wants to take as minimal as possible. Pt has fecal leakage recently when taking the laxatives when sitting on the toilet. Pt has Hx of diverticulitis. Pt has not messed her pants.     2) LBP 1/10 today, at its worst 5/10,  located at B L/S junciton ( pt points to this area). pain is constant at low level. Started at least 20 years ago and she was on a step stool and reaching trying to paint and lost her balance and felt onto her  knee.  Plus pt also had skiing accident when she felt flat on her back. Pt underwent laminectomy surgery March 2022. Pt rteported she had  "screws." Pt had PT at [facility] where she lives. LBP occurs after period of standing > 30 min.      3) R hip bursitis started 2021 impacts her walking sometimes with going up stairs at home  with weight bearing .  Pt is not able to lie on her R side because of her R shoulder and R hip.      4) R shoulder pain for the past 5 years.  and limits with reaching behind her back and overheading.     Physical Walking: not able to walk daily  > 30 min but is not walking the distance she used to walk.   PRECAUTIONS:  fall risk   SUBJECTIVE:                           Pt reports LBP is now a 2/10 and no longer 5/10. Pt walked 38 min.  Pt wants to decrease one of her medications because it is causing dry mouth and dry eyes. Pt has not spoken with the doctor who prescribed it. Urgency occurs when she waited too long.     TODAY'S Assessment/  TREATMENT:     [facility] PT Assessment - 11/30/21 1633       Observation/Other Assessments   Observations posterior COM, heel WBing, shoulders and cervical spine posterior      Other:   Other/ Comments double heel raises with single UE support, poor eccentric control on lowering, posterior COM      Ambulation/Gait   Gait Comments propioception of cervical spine/ shoulders posterior, minimal hip. knee flexion, heel striking , short strides             [facility] Adult PT Treatment/Exercise - 11/30/21 1633       Therapeutic Activites    Other Therapeutic Activities explained new cues for propioception is serving her now as her spine has realigned, previous PT cues served her for where her posture was then      Neuro Re-ed    Neuro Re-ed Details  excessive cues for more anterior tilt of pelvis, WBing equal between ballmounds, heels, thorax, cervical spine , gait training with more hip flexion/ knee flexion                       PATIENT EDUCATION: Education details: Explained muscles attachments/ connection, physiology of deep core system/ spinal- thoracic-pelvis-lower kinetic chain as they relate to pt's presentation, Sx, and past Hx. Explained what and  how these areas of deficits need to be restored to balance and function   See neuro-re-edu/ therapeutic activity section  Person educated: Patient Education method: Explanation, Demonstration, Tactile cues, and Verbal cues Education comprehension: verbalized understanding, returned demonstration, verbal cues required, and tactile cues required   HOME EXERCISE PROGRAM: See pt instruction section    PT Long Term Goals - 11/23/21 1722       PT LONG TERM GOAL #1   Title Pt will report decreased urgency to urinate after 1.5 hours of travelling, going from sit to stand ,  and without altered gait to the bathroom without leakage  to demo improved continence and to visit step mom in [place]    Baseline urge to urinate after 1-2 hours and uses altered gait pattern    Time 6    Period Weeks    Status On-going    Target Date 01/04/22      PT LONG TERM GOAL #2   Title Pt will demo levelled pelvic girdle and shoulder and less scoliotic curve to progress to deep core HEP in order to improve postural stability for QOL    Baseline R shoudler and L iliac rest lower, convex T/L junction    Time 4    Period Weeks    Status Achieved    Target Date 10/11/21      PT LONG TERM GOAL #3   Title Pt will demo improved gait speed from 1.11 m/s to > 1.3 m/s and demo reciporcal gait pattern to progress to walking longer distance    Time 6    Status Partially Met    Target Date 01/04/22      PT LONG TERM GOAL #4   Title Pt will demo increased R hip LE from 4-/5 to 5/5  and no LBP with R sideflexion in order to sleep on R side with no R hip bursitis pain    Time 8    Period Weeks    Status On-going    Target Date 01/18/22      PT LONG TERM GOAL #5    Title Pt will improve bowel leakage on FOTO from 62 pt by > 5 pt point change in order to demo more control of sphincter when urinating    Time 10    Status On-going    Target Date 02/01/22      PT LONG TERM GOAL #6   Title Pt will report no difficulty / R shoulder pain when wiping for bowel movements    Time 5    Period Weeks    Status On-going    Target Date 12/06/21                   Plan     Clinical Impression Statement Provided excessive cues for propioception training for spine and gait. Emphasized more anterior tilt of pelvis, WBing equal between ballmounds, heels, thorax, cervical spine , gait training with more hip flexion/ knee flexion.  Explained anterior pelvic tilt with toileting to help with urine elimination. Plan to assess more at pelvic floor next session.   Advised pt to consult her medical doctors about her medications as pt voiced she wanted to decrease taking one of the medications. Pt voiced understanding.     Pt continues to benefit from skilled PT.      Personal Factors and Comorbidities Comorbidity 3+    Comorbidities Hodgkin Lymphoma in 2006 with chemo, radiation Tx, scoliosis    Stability/Clinical Decision Making  Evolving/Moderate complexity    Rehab Potential Good    PT Frequency 1x / week    PT Duration Other (comment)   10   PT Treatment/Interventions Neuromuscular re-education;Balance training;Therapeutic exercise;Gait training;Stair training;Functional mobility training;Therapeutic activities;Splinting;Taping;Patient/family education;Scar mobilization;Dry needling;Manual techniques;Electrical Stimulation;Cryotherapy;Moist Heat;ADLs/Self Care Home Management;Traction;Energy conservation    Consulted and Agree with Plan of Care Patient               Jerl Mina, PT 11/30/2021, 3:09 PM

## 2021-12-08 ENCOUNTER — Ambulatory Visit: Payer: Medicare Other | Admitting: Physical Therapy

## 2021-12-08 DIAGNOSIS — M533 Sacrococcygeal disorders, not elsewhere classified: Secondary | ICD-10-CM

## 2021-12-08 DIAGNOSIS — M4125 Other idiopathic scoliosis, thoracolumbar region: Secondary | ICD-10-CM

## 2021-12-08 DIAGNOSIS — R279 Unspecified lack of coordination: Secondary | ICD-10-CM

## 2021-12-08 DIAGNOSIS — M25551 Pain in right hip: Secondary | ICD-10-CM

## 2021-12-08 DIAGNOSIS — G8929 Other chronic pain: Secondary | ICD-10-CM

## 2021-12-08 DIAGNOSIS — M25511 Pain in right shoulder: Secondary | ICD-10-CM | POA: Diagnosis not present

## 2021-12-08 NOTE — Patient Instructions (Signed)
OUTPATIENT PHYSICAL THERAPY FEMALE PELVIC Treatment    Patient Name: Andrea Noble MRN: [id number] DOB:06/16/1989, 71 y.o., female Today's Date: 04/12/2022    PT End of Session - 04/11/22 1322     Visit Number 14    Number of Visits 22    Date for PT Re-Evaluation 05/26/22    PT Start Time 1015    PT Stop Time 1100    PT Time Calculation (min) 45 min    Activity Tolerance Patient tolerated treatment well    Behavior During Therapy WFL for tasks assessed/performed             Past Medical History:  Diagnosis Date   Motion sickness    cars, boats, planes   Wears contact lenses    Past Surgical History:  Procedure Laterality Date   MASS EXCISION N/A 04/07/2017   Procedure: MINOR EXCISION OF MASS  FACIAL .5CM;  Surgeon: McQueen, Chapman, MD;  Location: [facility];  Service: ENT;  Laterality: N/A;   TONSILLECTOMY  2008   Dr. McQueen   There are no problems to display for this patient.     REFERRING PROVIDER: Danielle Wilson, CNM   REFERRING DIAG: Urinary incontinence  THERAPY DIAG:  Sacrococcygeal disorders, not elsewhere classified  Other lack of coordination  Other muscle spasm  Chronic pain of left knee  Other low back pain  Chronic pain of right knee  Chronic pain of both feet  Rationale for Evaluation and Treatment Rehabilitation  ONSET DATE:  04/2016 when her 1st UTI and LBP occurred   SUBJECTIVE:                                                                                                                                                                                           SUBJECTIVE STATEMENT: Pt has no complaints today. Pt is ready for internal pelvic floor assessment today  PAIN:  Are you having pain? No  PRECAUTIONS: None  WEIGHT BEARING RESTRICTIONS No  FALLS:  Has patient fallen in last 6 months? No  LIVING ENVIRONMENT: Lives with: lives with their family Lives in: House/apartment Stairs: No Has  following equipment at home: None  OCCUPATION: consultant and sits all day   PLOF: Independent  PATIENT GOALS   "To not have incontince, UTI and to prepare for conception"  PERTINENT HISTORY:  See above in Subjective    OBJECTIVE:   Muscle strength    ( eval 10/20/21)  RLE 4-/5 hip flexion. Knee flexion, R digit II-IV toe ext  3/5,  R 5/5 DF/ EV, PF  LE  5/5 for hip flexion, knee ext, flexion, DF/   EV, PF                          L hip abd 3+/5, R hip 4/5   ( post Tx: L hip abd 4/5) (11/04/21) B hip abd 4/5  indicates good carry over from last Tx (12/22/21)  PF:  R 1 rep without UE support , L 3 reps MMT 4/5, with UE support R 7 reps, L 12 reps.         (12/27/21)  PF:  R 11 rep without UE support , L 5 reps without UE support        (12/22/21)  R hip abd 3+/5, L 4/5          (12/27/21)  R hip abd 4-/5, L 4+/5    ROM:  ANKLE: Pre Tx: DF AROM OKC B 100 deg / Post Tx: 85 deg B , standing other knee against wall, back ankle 85 deg B CKC/ Post Tx: B 72 deg    (11/04/21)   DF AROM OKC L 68 deg, R 65  deg .   Standing: L 75 deg , R deg ( 11/11/21) , R 75 deg ( 11/25/21)   DF AROM OKC L 60 deg, R 70 deg  Standing :  L 70 deg, R 60 deg, ( 12/08/21)   KNEE Extension ( supine 90-90 deg position)  Pre Tx: R 145 deg, L 140 deg / Post Ts:  R 152 deg, L 145 deg ( 11/04/21)  R 145 deg, L 155 deg ( 11/11/21)  R 147 deg, L 135 deg ( 11/25/21)  B 165 deg (12/08/21)    TOE EXT : limited B , tenderness at the dorsum  ( 11/04/21)   SIJ : R ABD/ ER more limited  ( 11/04/21)  R ABD/ER restored mobility, L limited ( 11/11/21)  L FADDIR mobility limited ( pre Tx)     GAIT: Supination, limited trunk rotation, hip flexion, adducted knees ( eval 10/20/21)                 POSTURE:  forward head and posterior pelvic tilt    ( eval 10/20/21)  Less forward head ( 11/25/21)                                 PELVIC ALIGNMENT:       L shoulder/ L iliac crest lowered in standing and supine  ( eval 10/20/21)  Levelled  pelvis./ shoulders ( 11/04/21)              Palpation:   Increased tightness along L paraspinals, posterior tilt of pelvis, hyperextended knees, adducted knees ( eval 10/20/21)  Increased tightness along L medial/ lateral hamstrings, dorsum / plantar of B feet/ post Tx: improved mobility ( 11/04/21)     Pelvic Floor Special Questions - 04/11/22 1325     Pelvic Floor Internal Exam pt consented verbally without contraindications    Exam Type Vaginal    Palpation tightness at iliococcygeus B, piriformis, ATLA L > R, coccygeus ,  11-2 o'clock   poor feet propioception, when cued for feet stability, pelvic floor reported to be less tender and tight            [facility] Adult PT Treatment/Exercise - 04/11/22 1330       Neuro Re-ed    Neuro Re-ed Details  cued for feet propioception with   pelvic floor relaxation/ lenghtening , cued for pelvic floor stretches      Manual Therapy   Internal Pelvic Floor STM/MWM at areas note din assessment to promote movement of pelvic floor               PATIENT EDUCATION:  Education details: see pt instructions,  Connection of lower kinetic chain to pelvic floor overactivity and how to restore proper alignment and less tightness of feet and knees / SIJ   Person educated: Patient Education method: Explanation, Demonstration, Tactile cues, Verbal cues, and Handouts Education comprehension: verbalized understanding, returned demonstration, verbal cues required, and tactile cues required   HOME EXERCISE PROGRAM: See pt instruction     CLINICAL IMPRESSION:    Addressed pelvic floor mm with internal assessment and Tx today.  Pt demo'd increased mm tensions posterior mm > anterior and required excessive cues for feet propioception to optimize pelvic floor relaxation and lengthening. Plan to continue with internal pelvic floor Tx at next session to achieve proper coordination and mobility with deep core system.   Pt benefits from skilled PT to further  improve Sx /achieve goals.    OBJECTIVE IMPAIRMENTS Abnormal gait, decreased activity tolerance, decreased balance, decreased coordination, decreased endurance, decreased mobility, decreased ROM, decreased strength, decreased safety awareness, dizziness, increased muscle spasms, impaired flexibility, impaired UE functional use, improper body mechanics, and postural dysfunction.   ACTIVITY LIMITATIONS standing, squatting, and stairs  PARTICIPATION LIMITATIONS: community activity    REHAB POTENTIAL: Good  CLINICAL DECISION MAKING: Evolving/moderate complexity  EVALUATION COMPLEXITY: Moderate   GOALS: Goals reviewed with patient? Yes  SHORT TERM GOAL:  Target date:  11/18/2021  Pt will demo IND with HEP  Baseline:  not IND  Goal status: achieved   LONG TERM GOALS: Target date: 05/26/2022      Pt will demo levelled pelvic girdle and shoulder height in order to progress to deep core strengthening HEP  Baseline:   L shoulder/ L iliac crest lowered in standing and supine  Goal status: achieved   2.  Pt will demo less adducted knees and supination in gait to ascend / descend stairs with less pain Baseline:  adducted knees and pain with stairs  Goal status: partially met    ( 12/22/21: pt feels she can squat lower than before and less pain with stairs, slightly adducted knees present)   3. Pt will demo increased toe extension of digit II-IV on R in order to achieve more WBing on transverse arch and less supination to decrease feet pain Baseline: 3/5 MMT  Goal status:partially met   ( 12/22/21: 3+/5 MMT)   4.  Pt will demo > 5 pt change on Lumbar FOTO to improve QOL  Baseline:  94 pts   Goal status: Achieved ( 12/22/21: 83pts)   5.  Pt will demo > 5 pt change on Urinary Problem  FOTO Baseline:  55 pts    Goal status: Achieved ( 12/22/21: 60 pts)   6. Pt will demo increased hip abd to > 4/5 on L in order to achieve pelvic girdle stability, minimize adducted knees/ overactivity  of pelvic floor  Baseline: 3/5 MMT  Goal status: New     PLAN: PT FREQUENCY: 1x/week  PT DURATION: 10 weeks  PLANNED INTERVENTIONS: Therapeutic exercises, Therapeutic activity, Neuromuscular re-education, Balance training, Gait training, Patient/Family education, Joint mobilization, Dry Needling, Spinal mobilization, Moist heat, Taping, and Manual therapy  PLAN FOR NEXT SESSION:  Plan to address pelvic and spinal misalignment at next session.      Jude Linck,Shin Yiing, PT 04/12/2022, 8:38 AM 

## 2021-12-08 NOTE — Therapy (Addendum)
OUTPATIENT PHYSICAL THERAPY TREATMENT NOTE    Patient Name: Andrea Noble MRN: [id number] DOB:[birth year], 71 y.o., female Today's Date: 12/08/2021   REFERRING PROVIDER:  Schermerhorn   PT End of Session - 11/921      Visit Number 9   Date for PT Re-Evaluation 02/01/2022    PT Start Time 3220   PT Stop Time 1500   PT Time Calculation (min) 38 min    Activity Tolerance Patient tolerated treatment well    Behavior During Therapy WFL for tasks assessed/performed                  Past Medical History:  Diagnosis Date   Anxiety    Arthritis    Asthma    Cataract    Depression    Diverticulitis    GERD (gastroesophageal reflux disease)    Hodgkin's lymphoma ([place]) 2006   underwent radiation and chemo, last CT scan since moving to [place] from [place]   Hyperlipidemia    Hypothyroidism    Menopausal syndrome    OAB (overactive bladder)    Pneumonia    Seasonal allergies    SVT (supraventricular tachycardia) ([place])    Thyroid disease    hypothyroid   Past Surgical History:  Procedure Laterality Date   BLEPHAROPLASTY Bilateral    BREAST BIOPSY     benign   BREAST BIOPSY  07/27/2018   [place] core bx venus COLUMNAR CELL METAPLASIA, AND PSEUDOANGIOMATOUS STROMAL   CARPAL TUNNEL RELEASE Right    CARPAL TUNNEL RELEASE Left    CATARACT EXTRACTION Bilateral 2015   CLEFT LIP REPAIR     COLONOSCOPY WITH PROPOFOL N/A 07/09/2018   Procedure: COLONOSCOPY WITH PROPOFOL;  Surgeon: Lollie Sails, MD;  Location: [facility];  Service: Endoscopy;  Laterality: N/A;   DE QUERVAIN'S RELEASE Bilateral    DIAGNOSTIC LAPAROSCOPY     endometriosis   ESOPHAGOGASTRODUODENOSCOPY N/A 07/09/2018   Procedure: ESOPHAGOGASTRODUODENOSCOPY (EGD);  Surgeon: Lollie Sails, MD;  Location: [facility] ENDOSCOPY;  Service: Endoscopy;  Laterality: N/A;   FINGER SURGERY Right    Middle finger joint replaced   MAXIMUM ACCESS (MAS) TRANSFORAMINAL LUMBAR INTERBODY FUSION (TLIF) 2 LEVEL Right 07/06/2020    Procedure: L4/5, L5/S1 TRANSFORAMINAL LUMBAR INTERBODY FUSION (TLIF), L4-S1 PEDICLE SCREWS,DECOMPRESSION ;  Surgeon: Deetta Perla, MD;  Location: [facility];  Service: Neurosurgery;  Laterality: Right;   THUMB SURGERY Right    took tendon from forearm and wrapped it around the thumb joint   Patient Active Problem List   Diagnosis Date Noted   Loose stools 10/15/2021   Cough 10/15/2021   Preventative health care 02/03/2021   Mood disorder ([place]) 02/03/2021   Atherosclerosis of aorta ([place]) 02/03/2021   Intermittent asthma, well controlled 07/31/2020   SVT (supraventricular tachycardia) ([facility])    Hyperlipidemia    Menopausal syndrome    OAB (overactive bladder)    Seasonal allergies    Lumbar radiculopathy 07/06/2020   History of Hodgkin's lymphoma 03/01/2017    REFERRING DIAG:             Urinary Urgency  THERAPY DIAG:  Chronic right shoulder pain  Other idiopathic scoliosis, thoracolumbar region  Pain in right hip  Unspecified lack of coordination  Sacrococcygeal disorders, not elsewhere classified  Rationale for Evaluation and Treatment Rehabilitation  PERTINENT HISTORY:    Sx reported on eval 09/13/21  1) urinary urgency/ fecal leakage: Sometimes pt has leakage 20% of the time.  Pt wear a pad as a precaution. Pt feels the  urge to urinate when going from sit to stand after sitting for 1-2 hours.  Pt would wake up and need to go to the bathroom and she when she gets to the toilet , she has soft stool with urination. Pt is taking Miralax Colace and she wants to take as minimal as possible. Pt has fecal leakage recently when taking the laxatives when sitting on the toilet. Pt has Hx of diverticulitis. Pt has not messed her pants.     2) LBP 1/10 today, at its worst 5/10,  located at B L/S junciton ( pt points to this area). pain is constant at low level. Started at least 20 years ago and she was on a step stool and reaching trying to paint and lost her balance and felt onto her knee.   Plus pt also had skiing accident when she felt flat on her back. Pt underwent laminectomy surgery March 2022. Pt rteported she had  "screws." Pt had PT at [facility] where she lives. LBP occurs after period of standing > 30 min.      3) R hip bursitis started 2021 impacts her walking sometimes with going up stairs at home  with weight bearing .  Pt is not able to lie on her R side because of her R shoulder and R hip.      4) R shoulder pain for the past 5 years.  and limits with reaching behind her back and overheading.     Physical Walking: not able to walk daily  > 30 min but is not walking the distance she used to walk.   PRECAUTIONS:  fall risk   SUBJECTIVE:                           Pt thinks she has a UTI. Pt has difficulty with bowel movements and has to use her finger. Pt has been experimenting with  Miralax to help with stool consistency      TODAY'S Assessment/  TREATMENT:     Pelvic Floor Special Questions - 12/08/21 1726     External Palpation tightenss /tenderness along posterior pelvic floor R> L               [facility] Adult PT Treatment/Exercise - 12/08/21 1726       Therapeutic Activites    Other Therapeutic Activities explained importance of lengthened pelvic floor for bowel movements, reassessed FOTO      Neuro Re-ed    Neuro Re-ed Details  cued for pelvic floor stretches      Manual Therapy   Manual therapy comments /MWM at problem areas noted in assessment to minimize mm tightness along pelvic floor, optimize relaxation               PATIENT EDUCATION: Education details: Explained muscles attachments/ connection, physiology of deep core system/ spinal- thoracic-pelvis-lower kinetic chain as they relate to pt's presentation, Sx, and past Hx. Explained what and how these areas of deficits need to be restored to balance and function   See neuro-re-edu/ therapeutic activity section  Person educated: Patient Education method: Explanation,  Demonstration, Tactile cues, and Verbal cues Education comprehension: verbalized understanding, returned demonstration, verbal cues required, and tactile cues required   HOME EXERCISE PROGRAM: See pt instruction section    PT Long Term Goals       PT LONG TERM GOAL #1   Title Pt will report decreased urgency to urinate after 1.5 hours of  travelling, going from sit to stand ,  and without altered gait to the bathroom without leakage  to demo improved continence and to visit step mom in [place]    Baseline urge to urinate after 1-2 hours and uses altered gait pattern    Time 6    Period Weeks    Status On-going    Target Date 01/04/22      PT LONG TERM GOAL #2   Title Pt will demo levelled pelvic girdle and shoulder and less scoliotic curve to progress to deep core HEP in order to improve postural stability for QOL    Baseline R shoudler and L iliac rest lower, convex T/L junction    Time 4    Period Weeks    Status Achieved    Target Date 10/11/21      PT LONG TERM GOAL #3   Title Pt will demo improved gait speed from 1.11 m/s to > 1.3 m/s and demo reciporcal gait pattern to progress to walking longer distance    Time 6    Status Partially Met    Target Date 01/04/22      PT LONG TERM GOAL #4   Title Pt will demo increased R hip LE from 4-/5 to 5/5  and no LBP with R sideflexion in order to sleep on R side with no R hip bursitis pain    Time 8    Period Weeks    Status On-going    Target Date 01/18/22      PT LONG TERM GOAL #5   Title Pt will improve bowel leakage on FOTO from 62 pt by > 5 pt point change in order to demo more control of sphincter when urinating    Time 10    Status On-going    Target Date 02/01/22      PT LONG TERM GOAL #6   Title Pt will report no difficulty / R shoulder pain when wiping for bowel movements    Time 5    Period Weeks    Status On-going    Target Date 12/06/21                   Plan     Clinical Impression Statement   Pt showed significantly improved posture with much less thoracic kyphosis / forward head which deemed her readiness for pelvic floor assessment today. Progressed to pelvic floor assessment through clothing today and noted increased tightness with R > L. Following MWM techniques, pt demo'd improved mobility and lengthening to pelvic floor. Explained lengthening of pelvic floor to help with BMs. Plan to continue addressing pelvic floor at upcoming sessions to help with bowel elimination.  Pt continues to benefit from skilled PT.      Personal Factors and Comorbidities Comorbidity 3+    Comorbidities Hodgkin Lymphoma in 2006 with chemo, radiation Tx, scoliosis    Stability/Clinical Decision Making Evolving/Moderate complexity    Rehab Potential Good    PT Frequency 1x / week    PT Duration Other (comment)   10   PT Treatment/Interventions Neuromuscular re-education;Balance training;Therapeutic exercise;Gait training;Stair training;Functional mobility training;Therapeutic activities;Splinting;Taping;Patient/family education;Scar mobilization;Dry needling;Manual techniques;Electrical Stimulation;Cryotherapy;Moist Heat;ADLs/Self Care Home Management;Traction;Energy conservation    Consulted and Agree with Plan of Care Patient               Jerl Mina, PT 12/08/2021, 2:26 PM

## 2021-12-11 ENCOUNTER — Ambulatory Visit
Admission: EM | Admit: 2021-12-11 | Discharge: 2021-12-11 | Disposition: A | Discharge status: Home / Self Care | Payer: Medicare Other | Attending: Emergency Medicine | Admitting: Emergency Medicine

## 2021-12-11 DIAGNOSIS — R300 Dysuria: Secondary | ICD-10-CM | POA: Diagnosis not present

## 2021-12-11 LAB — POCT URINALYSIS DIP (MANUAL ENTRY)
Bilirubin, UA: NEGATIVE
Glucose, UA: NEGATIVE mg/dL
Ketones, POC UA: NEGATIVE mg/dL
Leukocytes, UA: NEGATIVE
Nitrite, UA: NEGATIVE
Protein Ur, POC: NEGATIVE mg/dL
Spec Grav, UA: <=1.005 — AB (ref 1.010–1.025)
Urobilinogen, UA: 0.2 E.U./dL
pH, UA: 7 (ref 5.0–8.0)

## 2021-12-11 NOTE — Discharge Instructions (Addendum)
Your urine does not show signs of infection.  The is a trace amount of blood in your urine.  Please have this rechecked by your doctor.

## 2021-12-11 NOTE — ED Triage Notes (Signed)
Patient to urgent care with complaints of dysuria that started today. Reports urinary frequency for some time that she takes a medication for. Denies any known fevers.

## 2021-12-11 NOTE — ED Provider Notes (Signed)
[name]    CSN: [id number] Arrival date & time: 12/11/21  1525      History   Chief Complaint Chief Complaint  Patient presents with   Urinary Frequency    HPI Andrea Noble is a 71 y.o. female.  Patient presents with dysuria x1 day.  She reports ongoing chronic urinary frequency.  She denies fever, chills, abdominal pain, flank pain, hematuria, pelvic pain, or other symptoms.  No treatments at home.  The history is provided by the patient and medical records.    Past Medical History:  Diagnosis Date   Anxiety    Arthritis    Asthma    Cataract    Depression    Diverticulitis    GERD (gastroesophageal reflux disease)    Hodgkin's lymphoma ([place]) 2006   underwent radiation and chemo, last CT scan since moving to [place] from [place]   Hyperlipidemia    Hypothyroidism    Menopausal syndrome    OAB (overactive bladder)    Pneumonia    Seasonal allergies    SVT (supraventricular tachycardia) ([place])    Thyroid disease    hypothyroid    Patient Active Problem List   Diagnosis Date Noted   Loose stools 10/15/2021   Cough 10/15/2021   Preventative health care 02/03/2021   Mood disorder ([place]) 02/03/2021   Atherosclerosis of aorta ([place]) 02/03/2021   Intermittent asthma, well controlled 07/31/2020   SVT (supraventricular tachycardia) ([facility])    Hyperlipidemia    Menopausal syndrome    OAB (overactive bladder)    Seasonal allergies    Lumbar radiculopathy 07/06/2020   History of Hodgkin's lymphoma 03/01/2017    Past Surgical History:  Procedure Laterality Date   BLEPHAROPLASTY Bilateral    BREAST BIOPSY     benign   BREAST BIOPSY  07/27/2018   [place] core bx venus COLUMNAR CELL METAPLASIA, AND PSEUDOANGIOMATOUS STROMAL   CARPAL TUNNEL RELEASE Right    CARPAL TUNNEL RELEASE Left    CATARACT EXTRACTION Bilateral 2015   CLEFT LIP REPAIR     COLONOSCOPY WITH PROPOFOL N/A 07/09/2018   Procedure: COLONOSCOPY WITH PROPOFOL;  Surgeon: Lollie Sails, MD;   Location: [facility] END[facility]COPY;  Service: Endoscopy;  Laterality: N/A;   DE QUERVAIN'S RELEASE Bilateral    DIAGNOSTIC LAPAROSCOPY     endometriosis   ESOPHAGOGASTRODUODENOSCOPY N/A 07/09/2018   Procedure: ESOPHAGOGASTRODUODENOSCOPY (EGD);  Surgeon: Lollie Sails, MD;  Location[facility] ENDOSCOPY;  Service: Endoscopy;  Laterality: N/A;   FINGER SURGERY Right    Middle finger joint replaced   MAXIMUM ACCESS (MAS) TRANSFORAMINAL LUMBAR INTERBODY FUSION (TLIF) 2 LEVEL Right 07/06/2020   Procedure: L4/5, L5/S1 TRANSFORAMINAL LUMBAR INTERBODY FUSION (TLIF), L4-S1 PEDICLE SCREWS,DECOMPRESSION ;  Surgeon: Deetta Perla, MD;  Location: [facility] ORS;  Service: Neurosurgery;  Laterality: Right;   THUMB SURGERY Right    took tendon from forearm and wrapped it around the thumb joint    OB History   No obstetric history on file.      Home Medications    Prior to Admission medications   Medication Sig Start Date End Date Taking? Authorizing Provider  acetaminophen (TYLENOL) 650 MG CR tablet Take 1,300 mg by mouth at bedtime.    [provider]  albuterol (VENTOLIN HFA) 108 (90 Base) MCG/ACT inhaler Inhale 2 puffs into the lungs every 4 (four) hours as needed for wheezing or shortness of breath.    [provider]  buPROPion (WELLBUTRIN SR) 200 MG 12 hr tablet TAKE 1 TABLET(200 MG)  BY MOUTH TWICE DAILY 06/09/21   Venia Carbon, MD  busPIRone (BUSPAR) 15 MG tablet Take 1 tablet (15 mg total) by mouth 2 (two) times daily. 10/15/21   Venia Carbon, MD  Calcium Carbonate-Vit D-Min (CALTRATE MINIS PLUS MINERALS PO) Take 40 mcg by mouth daily.    [provider]  Carboxymethylcellulose Sodium (THERATEARS) 0.25 % SOLN Place 1 drop into both eyes daily as needed (Dry eyes).    [provider]  Collagen-Boron-Hyaluronic Acid (MOVE FREE ULTRA JOINT HEALTH) 40-5-3.3 MG TABS Take 1 tablet by mouth every evening.    [provider]  CRANBERRY PO Take 4,200 mg by mouth daily at  12 noon.    [provider]  diclofenac Sodium (VOLTAREN) 1 % GEL Apply 1 application topically 2 (two) times daily. 03/04/20   [provider]  diflorasone-emollient (APEXICON E) 0.05 % CREA Apply 1 application  topically daily as needed (Eczema).    [provider]  diltiazem (CARDIZEM CD) 120 MG 24 hr capsule Take 120 mg by mouth daily. 05/14/21   [provider]  docusate sodium (COLACE) 100 MG capsule Take 100 mg by mouth 2 (two) times daily.    [provider]  fluocinonide cream (LIDEX) 7.10 % Apply 1 application topically daily as needed (Eczema).    [provider]  fluticasone (FLONASE) 50 MCG/ACT nasal spray Place 1 spray into both nostrils every morning. 07/16/12   [provider]  fluticasone-salmeterol (ADVAIR) 100-50 MCG/ACT AEPB Inhale 1 puff into the lungs 2 (two) times daily. 10/04/21   Venia Carbon, MD  gabapentin (NEURONTIN) 100 MG capsule Take 200 mg by mouth 3 (three) times daily.    [provider]  Ivermectin 1 % CREA Apply 1 application topically daily. On face    [provider]  levothyroxine (SYNTHROID, LEVOTHROID) 50 MCG tablet Take 50-75 mcg by mouth See admin instructions. Take 75 mg every  Monday, Wednesday and Friday Take 50 mg all the other days    [provider]  liothyronine (CYTOMEL) 5 MCG tablet Take 2.5 mcg by mouth daily. 04/24/20   [provider]  meclizine (ANTIVERT) 25 MG tablet Take 25 mg by mouth daily as needed (Dizzyness). 07/16/05   [provider]  Melatonin 10 MG TABS Take 10 mg by mouth at bedtime. Timed-Release    [provider]  methocarbamol (ROBAXIN) 500 MG tablet Take 1 tablet (500 mg total) by mouth every 8 (eight) hours as needed for muscle spasms. 07/08/20   Deetta Perla, MD  methylcellulose (CITRUCEL) oral powder Take 1 Scoop by mouth at bedtime.    [provider]  montelukast (SINGULAIR) 10 MG tablet Take 1 tablet  (10 mg total) by mouth at bedtime. 05/17/21   Venia Carbon, MD  Multiple Vitamin (MULTIVITAMIN) tablet Take 1 tablet by mouth daily. Vita Fusion adult Gummie    [provider]  ondansetron (ZOFRAN) 4 MG tablet Take 1 tablet (4 mg total) by mouth every 6 (six) hours. 07/31/21   Scot Jun, FNP  pantoprazole (PROTONIX) 40 MG tablet Take 40 mg by mouth every morning.    [provider]  Polyethyl Glycol-Propyl Glycol (SYSTANE) 0.4-0.3 % GEL ophthalmic gel Place 1 application into both eyes at bedtime.    [provider]  polyethylene glycol powder (GLYCOLAX/MIRALAX) 17 GM/SCOOP powder Take 17 g by mouth daily.    [provider]  pravastatin (PRAVACHOL) 40 MG tablet Take 2 tablets (80 mg total)  by mouth daily. 04/21/21   Venia Carbon, MD  PREMPRO 0.45-1.5 MG tablet Take 1 tablet by mouth daily. 12/24/16   [provider]  Probiotic Product ([place]) CAPS Take 1 capsule by mouth at bedtime.    [provider]  saccharomyces boulardii (FLORASTOR) 250 MG capsule Take 500 mg by mouth daily.    [provider]  sodium chloride (OCEAN) 0.65 % SOLN nasal spray Place 1 spray into both nostrils 2 (two) times daily.    [provider]  solifenacin (VESICARE) 10 MG tablet Take 10 mg by mouth every other day. 1530 04/17/20   [provider]  traZODone (DESYREL) 50 MG tablet TAKE 1 TABLET AT BEDTIME 04/05/21   Venia Carbon, MD    Family History Family History  Problem Relation Age of Onset   Breast cancer Maternal Aunt    Anuerysm Mother    Stroke Mother    Heart attack Father    Alzheimer's disease Father    Developmental delay Brother     Social History Social History   Tobacco Use   Smoking status: Never    Passive exposure: Past   Smokeless tobacco: Never  Vaping Use   Vaping Use: Never used  Substance Use Topics   Alcohol use: Yes    Comment: very occasional   Drug use: No      Allergies   Levofloxacin and Other   Review of Systems Review of Systems  Constitutional:  Negative for chills and fever.  Gastrointestinal:  Negative for abdominal pain, diarrhea and vomiting.  Genitourinary:  Positive for dysuria and frequency. Negative for flank pain, hematuria, pelvic pain and vaginal discharge.  Skin:  Negative for color change and rash.  All other systems reviewed and are negative.    Physical Exam Triage Vital Signs ED Triage Vitals  Enc Vitals Group     BP      Pulse      Resp      Temp      Temp src      SpO2      Weight      Height      Head Circumference      Peak Flow      Pain Score      Pain Loc      Pain Edu?      Excl. in [place]?    No data found.  Updated Vital Signs BP (!) 154/78   Pulse 96   Temp 98.4 F (36.9 C)   Resp 16   Ht '5\' 4"'$  (1.626 m)   Wt 144 lb (65.3 kg)   SpO2 99%   BMI 24.72 kg/m   Visual Acuity Right Eye Distance:   Left Eye Distance:   Bilateral Distance:    Right Eye Near:   Left Eye Near:    Bilateral Near:     Physical Exam Vitals and nursing note reviewed.  Constitutional:      General: She is not in acute distress.    Appearance: Normal appearance. She is well-developed. She is not ill-appearing.  HENT:     Mouth/Throat:     Mouth: Mucous membranes are moist.  Cardiovascular:     Rate and Rhythm: Normal rate and regular rhythm.     Heart sounds: Normal heart sounds.  Pulmonary:     Effort: Pulmonary effort is normal. No respiratory distress.     Breath sounds: Normal breath sounds.  Abdominal:  General: Bowel sounds are normal.     Palpations: Abdomen is soft.     Tenderness: There is no abdominal tenderness. There is no right CVA tenderness, left CVA tenderness, guarding or rebound.  Musculoskeletal:     Cervical back: Neck supple.  Skin:    General: Skin is warm and dry.  Neurological:     Mental Status: She is alert.  Psychiatric:        Mood and Affect: Mood normal.         Behavior: Behavior normal.      UC Treatments / Results  Labs (all labs ordered are listed, but only abnormal results are displayed) Labs Reviewed  POCT URINALYSIS DIP (MANUAL ENTRY) - Abnormal; Notable for the following components:      Result Value   Spec Grav, UA <=1.005 (*)    Blood, UA trace-intact (*)    All other components within normal limits    EKG   Radiology No results found.  Procedures Procedures (including critical care time)  Medications Ordered in UC Medications - No data to display  Initial Impression / Assessment and Plan / UC Course  I have reviewed the triage vital signs and the nursing notes.  Pertinent labs & imaging results that were available during my care of the patient were reviewed by me and considered in my medical decision making (see chart for details).   Dysuria.  Urine does not show signs of infection. It does have trace blood; discussed this with patient and instructed her to have this rechecked by her PCP.  Education provided on dysuria.  Patient agrees to plan of care.    Final Clinical Impressions(s) / UC Diagnoses   Final diagnoses:  Dysuria     Discharge Instructions      Your urine does not show signs of infection.  The is a trace amount of blood in your urine.  Please have this rechecked by your doctor.         ED Prescriptions   None    PDMP not reviewed this encounter.   Sharion Balloon, NP 12/11/21 1556

## 2021-12-14 ENCOUNTER — Encounter: Payer: Medicare Other | Admitting: Physical Therapy

## 2021-12-15 ENCOUNTER — Other Ambulatory Visit: Payer: Self-pay | Admitting: Internal Medicine

## 2021-12-15 ENCOUNTER — Ambulatory Visit: Payer: Medicare Other | Admitting: Physical Therapy

## 2021-12-16 ENCOUNTER — Telehealth: Payer: Self-pay | Admitting: Internal Medicine

## 2021-12-16 NOTE — Telephone Encounter (Addendum)
The 915 slot is not available. The pt at 9am needed a 30 min hospital f/u. Looks like pt is scheduled for 1145 tomorrow.

## 2021-12-16 NOTE — Telephone Encounter (Signed)
Pt was seen at Urgent Care on 12/11/21 and needs to see PCP with in a week . No available appointments until 8/29 . But there is a same day opening on 8/18 at  9:15 am . Can pt be put in that slot . Please Advise

## 2021-12-17 ENCOUNTER — Ambulatory Visit: Payer: Medicare Other | Admitting: Internal Medicine

## 2021-12-27 ENCOUNTER — Telehealth: Payer: Self-pay | Admitting: Internal Medicine

## 2021-12-27 NOTE — Telephone Encounter (Signed)
Tammy from [employer] home delivery stating that she sent over 2 faxes  on 8/17 and 8/21, on behalf of Pravastatin and buspirone. She hasn't received a response back. Shes asking for 90 day refills for these medications.

## 2021-12-28 MED ORDER — BUSPIRONE HCL 15 MG PO TABS: 15.0000 mg | ORAL_TABLET | Freq: Two times a day (BID) | ORAL | 3 refills | Status: DC

## 2021-12-28 MED ORDER — PRAVASTATIN SODIUM 40 MG PO TABS: 80.0000 mg | ORAL_TABLET | Freq: Every day | ORAL | 3 refills | Status: DC

## 2021-12-28 NOTE — Telephone Encounter (Signed)
We have not received these faxes or electronic requests. I have sent the 2 now.

## 2021-12-30 ENCOUNTER — Telehealth: Payer: Self-pay | Admitting: Oncology

## 2021-12-30 ENCOUNTER — Ambulatory Visit: Payer: Medicare Other | Admitting: Physical Therapy

## 2021-12-30 DIAGNOSIS — R279 Unspecified lack of coordination: Secondary | ICD-10-CM

## 2021-12-30 DIAGNOSIS — M4125 Other idiopathic scoliosis, thoracolumbar region: Secondary | ICD-10-CM

## 2021-12-30 DIAGNOSIS — M533 Sacrococcygeal disorders, not elsewhere classified: Secondary | ICD-10-CM

## 2021-12-30 DIAGNOSIS — M25551 Pain in right hip: Secondary | ICD-10-CM

## 2021-12-30 DIAGNOSIS — G8929 Other chronic pain: Secondary | ICD-10-CM

## 2021-12-30 NOTE — Telephone Encounter (Signed)
Patient saw Dr. Grayland Ormond 5 years ago and is having some issues again. She would like to schedule an appointment with him now.

## 2021-12-30 NOTE — Therapy (Signed)
OUTPATIENT PHYSICAL THERAPY TREATMENT NOTE  / Progress Note reporting from 09/13/21 across 10 visits    Patient Name: Andrea Noble MRN: [id number] DOB:1951/02/12, 71 y.o., female Today's Date: 12/30/2021   REFERRING PROVIDER:  Schermerhorn    PT End of Session - 12/30/21 1028     Visit Number 10    Date for PT Re-Evaluation 02/01/22   PN 12/30/21   PT Start Time 1021    PT Stop Time 1100    PT Time Calculation (min) 39 min    Activity Tolerance Patient tolerated treatment well    Behavior During Therapy [facility] for tasks assessed/performed                  Past Medical History:  Diagnosis Date   Anxiety    Arthritis    Asthma    Cataract    Depression    Diverticulitis    GERD (gastroesophageal reflux disease)    Hodgkin's lymphoma ([place]) 2006   underwent radiation and chemo, last CT scan since moving to [place] from [place]   Hyperlipidemia    Hypothyroidism    Menopausal syndrome    OAB (overactive bladder)    Pneumonia    Seasonal allergies    SVT (supraventricular tachycardia) ([place])    Thyroid disease    hypothyroid   Past Surgical History:  Procedure Laterality Date   BLEPHAROPLASTY Bilateral    BREAST BIOPSY     benign   BREAST BIOPSY  07/27/2018   [place] core bx venus COLUMNAR CELL METAPLASIA, AND PSEUDOANGIOMATOUS STROMAL   CARPAL TUNNEL RELEASE Right    CARPAL TUNNEL RELEASE Left    CATARACT EXTRACTION Bilateral 2015   CLEFT LIP REPAIR     COLONOSCOPY WITH PROPOFOL N/A 07/09/2018   Procedure: COLONOSCOPY WITH PROPOFOL;  Surgeon: Lollie Sails, MD;  Location: [facility] ENDOSCOPY;  Service: Endoscopy;  Laterality: N/A;   DE QUERVAIN'S RELEASE Bilateral    DIAGNOSTIC LAPAROSCOPY     endometriosis   ESOPHAGOGASTRODUODENOSCOPY N/A 07/09/2018   Procedure: ESOPHAGOGASTRODUODENOSCOPY (EGD);  Surgeon: Lollie Sails, MD;  Location: [facility] ENDOSCOPY;  Service: Endoscopy;  Laterality: N/A;   FINGER SURGERY Right    Middle finger joint replaced   MAXIMUM ACCESS  (MAS) TRANSFORAMINAL LUMBAR INTERBODY FUSION (TLIF) 2 LEVEL Right 07/06/2020   Procedure: L4/5, L5/S1 TRANSFORAMINAL LUMBAR INTERBODY FUSION (TLIF), L4-S1 PEDICLE SCREWS,DECOMPRESSION ;  Surgeon: Deetta Perla, MD;  Location: [facility] ORS;  Service: Neurosurgery;  Laterality: Right;   THUMB SURGERY Right    took tendon from forearm and wrapped it around the thumb joint   Patient Active Problem List   Diagnosis Date Noted   Loose stools 10/15/2021   Cough 10/15/2021   Preventative health care 02/03/2021   Mood disorder ([place]) 02/03/2021   Atherosclerosis of aorta ([place]) 02/03/2021   Intermittent asthma, well controlled 07/31/2020   SVT (supraventricular tachycardia) ([facility])    Hyperlipidemia    Menopausal syndrome    OAB (overactive bladder)    Seasonal allergies    Lumbar radiculopathy 07/06/2020   History of Hodgkin's lymphoma 03/01/2017    REFERRING DIAG:             Urinary Urgency  THERAPY DIAG:  Unspecified lack of coordination  Chronic right shoulder pain  Other idiopathic scoliosis, thoracolumbar region  Pain in right hip  Sacrococcygeal disorders, not elsewhere classified  Rationale for Evaluation and Treatment Rehabilitation  PERTINENT HISTORY:    Sx reported on eval 09/13/21  1) urinary urgency/ fecal leakage: Sometimes pt  has leakage 20% of the time.  Pt wear a pad as a precaution. Pt feels the urge to urinate when going from sit to stand after sitting for 1-2 hours.  Pt would wake up and need to go to the bathroom and she when she gets to the toilet , she has soft stool with urination. Pt is taking Miralax Colace and she wants to take as minimal as possible. Pt has fecal leakage recently when taking the laxatives when sitting on the toilet. Pt has Hx of diverticulitis. Pt has not messed her pants.     2) LBP 1/10 today, at its worst 5/10,  located at B L/S junciton ( pt points to this area). pain is constant at low level. Started at least 20 years ago and she was on a step  stool and reaching trying to paint and lost her balance and felt onto her knee.  Plus pt also had skiing accident when she felt flat on her back. Pt underwent laminectomy surgery March 2022. Pt rteported she had  "screws." Pt had PT at [facility] where she lives. LBP occurs after period of standing > 30 min.      3) R hip bursitis started 2021 impacts her walking sometimes with going up stairs at home  with weight bearing .  Pt is not able to lie on her R side because of her R shoulder and R hip.      4) R shoulder pain for the past 5 years.  and limits with reaching behind her back and overheading.     Physical Walking: not able to walk daily  > 30 min but is not walking the distance she used to walk.   PRECAUTIONS:  fall risk   SUBJECTIVE:                           Pt went to the Urgent Care on 8/12 /23 due to  burning with urination. Tests came back negative for UTI but positive for blood in the urine.  Pt is following up with Dr. Ouida Sills  9/12 @ 3pm.  Pt is still having burning with urination. Pt gets up in the middle of the night to pee and poop. Pt takes Miralax in the morning and one stool softener in the evening. Pt reports stools are small 2 inch pieces and coming out now and no longer constipated in a long time since taking the stool softener.  Pt notices when she pees and not pooping, she notices she has some feces coming out. Pt went to bed last night after 2 bowel movements and noticed her stool is still soft enough and it does not come out.   Every now and then, pt has noticed it takes longer to start to peeing during the night. But at night, "the floodgates open" and the stream is stronger. Pt gets one once a night to pee. Pt is trying to pee once every hour to avoid the urgency to pee.   Pt has not seen her oncologist for 5 years.   Pt is walking and not having back pain.     TODAY'S Assessment/  TREATMENT:   [facility] PT Assessment - 12/30/21 1318       Observation/Other  Assessments   Observations pelvic floor stretch modification required adjustment due to her limited IJ/ hip mobility      Strength   Overall Strength Comments pt prefrs her previous hip exercises she  learned from other PT rehab and peformed hip abduction standing with hip flexion.             [facility] PT Assessment - 12/30/21 1318       Observation/Other Assessments   Observations pelvic floor stretch modification required adjustment due to her limited IJ/ hip mobility      Strength   Overall Strength Comments pt prefrs her previous hip exercises she learned from other PT rehab and peformed hip abduction standing with hip flexion.              PATIENT EDUCATION: Education details: Explained muscles attachments/ connection, physiology of deep core system/ spinal- thoracic-pelvis-lower kinetic chain as they relate to pt's presentation, Sx, and past Hx. Explained what and how these areas of deficits need to be restored to balance and function   See neuro-re-edu/ therapeutic activity section  Person educated: Patient Education method: Explanation, Demonstration, Tactile cues, and Verbal cues Education comprehension: verbalized understanding, returned demonstration, verbal cues required, and tactile cues required   HOME EXERCISE PROGRAM: See pt instruction section    PT Long Term Goals       PT LONG TERM GOAL #1   Title Pt will report decreased urgency to urinate after 1.5 hours of travelling, going from sit to stand ,  and without altered gait to the bathroom without leakage to demo improved continence and to visit step mom in [place] urge to urinate after 1-2 hours and uses altered gait pattern   ( 12/30/21: pt has not travelled)    Time 6    Period Weeks    Status On-going    Target Date 01/04/22      PT LONG TERM GOAL #2   Title Pt will demo levelled pelvic girdle and shoulder and less scoliotic curve to progress to deep core HEP in order to improve  postural stability for QOL    Baseline R shoudler and L iliac rest lower, convex T/L junction    Time 4    Period Weeks    Status Achieved    Target Date 10/11/21      PT LONG TERM GOAL #3   Title Pt will demo improved gait speed from 1.11 m/s to > 1.3 m/s and demo reciporcal gait pattern to progress to walking longer distance   ( 12/30/21: 1.6 m/s with reciprocal gait and pt has returned walking atleast 5 days a week) .    Time 6    Status Achieved    Target Date 01/04/22      PT LONG TERM GOAL #4   Title Pt will demo increased R hip LE from 4-/5 to 5/5  and no LBP with R sideflexion in order to sleep on R side with no R hip bursitis pain   (12/30/21: RLE 5/5)    Time 8    Period Weeks    Status Achieved    Target Date 01/18/22      PT LONG TERM GOAL #5   Title Pt will improve bowel leakage on FOTO from 62 pt by > 5 pt point change in order to demo more control of sphincter when urinating    Time 10    Status On-going    Target Date 02/01/22      PT LONG TERM GOAL #6   Title Pt will report no difficulty / R shoulder pain when wiping for bowel movements   (12/30/21: 50% improved )    Time 5  Period Weeks    Status Partially achieved    Target Date 12/06/21                   Plan     Clinical Impression Statement Pt has achieved 3/6 goals and progressing towards remaining goals.  Pt's spinal/ pelvic  deviations, upright posture, hip strength, and gait speed have improved significantly.   Orthopedic issues have been addressed to great degree across the past visits:  FOTO score for LBP has improved which correlates with her report of 75% improvement with LBP and her pelvic and spinal misalignment which was addressed. Pt has returned to walking routine without LBP.  Pt;s improved upright posture and decreased thoracic kyphosis and less forward head will minimize worsening of shoulder pain. Pt reports she is able to use R shoulder with bowel movements and hygiene but  R shoulder still hurts hen she is lying on her R side for sleeping.   Pelvic issues remain an area of focus for upcoming sessions. So far, ptt is IND with deep core strengthening exercises and demonstrates proper lengthening of pelvic floor mm which will help with bowel movements and decreasing urinary frequency and urgency. Internal pelvic floor assessment has not been performed and has been on hold. Only external assessment was performed at last session which showed increased mm tightness. Pt is IND with modification of pelvic floor stretches that was trialed in today's session.     Interdisciplinary updates:  Discussed putting PT on hold so pt can f/u with her medical providers for burning with urination and blood was found in her urine when she went to the Urgent Care on 12/11/21.   Pt has an appt with  her OB/GYN. Witholding on initiating internal pelvic floor assessment until pt's symptoms related to blood in her urine and burning with urination  have been cleared from her medical providers.   Advised pt to also f/u with her oncologist whom she has not seen in 5 years after Tx for [place] Lymphoma. Pt also plans to f/u with PCP .     Pt continues to benefit from skilled PT but plan to await for clearance from medical providers before resuming pelvic floor Tx.     Personal Factors and Comorbidities Comorbidity 3+    Comorbidities Hodgkin Lymphoma in 2006 with chemo, radiation Tx, scoliosis    Stability/Clinical Decision Making Evolving/Moderate complexity    Rehab Potential Good    PT Frequency 1x / week    PT Duration Other (comment)   10   PT Treatment/Interventions Neuromuscular re-education;Balance training;Therapeutic exercise;Gait training;Stair training;Functional mobility training;Therapeutic activities;Splinting;Taping;Patient/family education;Scar mobilization;Dry needling;Manual techniques;Electrical Stimulation;Cryotherapy;Moist Heat;ADLs/Self Care Home Management;Traction;Energy  conservation    Consulted and Agree with Plan of Care Patient               Jerl Mina, PT 12/30/2021, 1:23 PM

## 2022-01-06 ENCOUNTER — Inpatient Hospital Stay: Payer: Medicare Other | Attending: Internal Medicine | Admitting: Internal Medicine

## 2022-01-06 ENCOUNTER — Inpatient Hospital Stay: Payer: Medicare Other

## 2022-01-06 ENCOUNTER — Encounter: Payer: Self-pay | Admitting: Internal Medicine

## 2022-01-06 VITALS — BP 132/70 | HR 99 | Temp 99.2°F | Resp 18 | Wt 143.3 lb

## 2022-01-06 DIAGNOSIS — Z923 Personal history of irradiation: Secondary | ICD-10-CM | POA: Diagnosis not present

## 2022-01-06 DIAGNOSIS — K219 Gastro-esophageal reflux disease without esophagitis: Secondary | ICD-10-CM | POA: Insufficient documentation

## 2022-01-06 DIAGNOSIS — E039 Hypothyroidism, unspecified: Secondary | ICD-10-CM | POA: Diagnosis not present

## 2022-01-06 DIAGNOSIS — R5383 Other fatigue: Secondary | ICD-10-CM | POA: Diagnosis not present

## 2022-01-06 DIAGNOSIS — Z79899 Other long term (current) drug therapy: Secondary | ICD-10-CM | POA: Insufficient documentation

## 2022-01-06 DIAGNOSIS — E785 Hyperlipidemia, unspecified: Secondary | ICD-10-CM | POA: Diagnosis not present

## 2022-01-06 DIAGNOSIS — M19011 Primary osteoarthritis, right shoulder: Secondary | ICD-10-CM | POA: Insufficient documentation

## 2022-01-06 DIAGNOSIS — I471 Supraventricular tachycardia: Secondary | ICD-10-CM | POA: Diagnosis not present

## 2022-01-06 DIAGNOSIS — Z8571 Personal history of Hodgkin lymphoma: Secondary | ICD-10-CM | POA: Diagnosis not present

## 2022-01-06 DIAGNOSIS — Z8572 Personal history of non-Hodgkin lymphomas: Secondary | ICD-10-CM | POA: Insufficient documentation

## 2022-01-06 DIAGNOSIS — Z803 Family history of malignant neoplasm of breast: Secondary | ICD-10-CM | POA: Diagnosis not present

## 2022-01-06 DIAGNOSIS — Z79890 Hormone replacement therapy (postmenopausal): Secondary | ICD-10-CM | POA: Diagnosis not present

## 2022-01-06 NOTE — Progress Notes (Signed)
[place]  [phone] [phone]) [phone]  ID: Adalena Abdulla OB: Oct 26, 1950  MR#: [id number]  JOI#:[id number]  Patient Care Team: Venia Carbon, MD as PCP - General (Internal Medicine)  REFERRING PROVIDER: Dr. Silvio Pate   REASON FOR REFERRAL: history of Hodgkin lymphoma   HPI: Andrea Noble is a 71 y.o. female with past medical history of Hodgkin's lymphoma, diverticulitis, GERD, SVT, hypothyroidism was seen today for surveillance follow-up.  She was diagnosed with Hodgkin's lymphoma from a neck lymph node biopsy in December 2006.  She received chemotherapy with ABVD starting in December 2006 and completing in March 2007.  She also had adjuvant XRT in May and June 2007.  She had multiple PET scans after revealing complete remission.  She had a treatment in [place].  She was seen by Dr. Grayland Ormond in 2018 when she was admitted to ED for shortness of breath.  CT neck, chest, abdomen pelvis did not show any concerns for recurrence at that time.  Patient wanted to be checked out because she has been having bladder/bowel issues and right shoulder pain for some time now.  She has history of constipation and takes MiraLAX and stool softener.  She has urinary urgency at times.  She is on Mybertiq and doing pelvic floor exercise.  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  Reportedly she had x-ray which showed bone-on-bone disease.  She has chronic exhaustion.  Reports history of fibromyalgia.  She follows with endocrinologist for her thyroid function.  Her PCP is doing annual breast MRIs with her history of prior radiation.  Patient denies fever, chills, nausea, vomiting, shortness of breath, cough, night sweats, abdominal pain, bleeding, bowel or bladder issues. Appetite is good.  Denies any weight loss.  MEDICAL HISTORY:  Past Medical History:  Diagnosis Date   Anxiety    Arthritis    Asthma    Cataract    Depression     Diverticulitis    GERD (gastroesophageal reflux disease)    Hodgkin's lymphoma ([place]) 2006   underwent radiation and chemo, last CT scan since moving to  from [place]   Hyperlipidemia    Hypothyroidism    Menopausal syndrome    OAB (overactive bladder)    Pneumonia    Seasonal allergies    SVT (supraventricular tachycardia) (HCC)    Thyroid disease    hypothyroid    SURGICAL HISTORY: Past Surgical History:  Procedure Laterality Date   BLEPHAROPLASTY Bilateral    BREAST BIOPSY     benign   BREAST BIOPSY  07/27/2018   [place] core bx venus COLUMNAR CELL METAPLASIA, AND PSEUDOANGIOMATOUS STROMAL   CARPAL TUNNEL RELEASE Right    CARPAL TUNNEL RELEASE Left    CATARACT EXTRACTION Bilateral 2015   CLEFT LIP REPAIR     COLONOSCOPY WITH PROPOFOL N/A 07/09/2018   Procedure: COLONOSCOPY WITH PROPOFOL;  Surgeon: Lollie Sails, MD;  Location: [facility] ENDOSCOPY;  Service: Endoscopy;  Laterality: N/A;   DE QUERVAIN'S RELEASE Bilateral    DIAGNOSTIC LAPAROSCOPY     endometriosis   ESOPHAGOGASTRODUODENOSCOPY N/A 07/09/2018   Procedure: ESOPHAGOGASTRODUODENOSCOPY (EGD);  Surgeon: Lollie Sails, MD;  Location: [facility] ENDOSCOPY;  Service: Endoscopy;  Laterality: N/A;   FINGER SURGERY Right    Middle finger joint replaced   MAXIMUM ACCESS (MAS) TRANSFORAMINAL LUMBAR INTERBODY FUSION (TLIF) 2 LEVEL Right 07/06/2020   Procedure: L4/5, L5/S1 TRANSFORAMINAL LUMBAR INTERBODY FUSION (TLIF), L4-S1 PEDICLE SCREWS,DECOMPRESSION ;  Surgeon: Deetta Perla, MD;  Location: [facility] ORS;  Service: Neurosurgery;  Laterality: Right;   THUMB SURGERY Right    took tendon from forearm and wrapped it around the thumb joint    SOCIAL HISTORY: Social History   Socioeconomic History   Marital status: Married    Spouse name: Dwight   Number of children: 0   Years of education: Not on file   Highest education level: Not on file  Occupational History   Occupation: Social work/chaplain    Comment: 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  [employer]  Tobacco Use   Smoking status: Never    Passive exposure: Past   Smokeless tobacco: Never  Vaping Use   Vaping Use: Never used  Substance and Sexual Activity   Alcohol use: Yes    Comment: very occasional   Drug use: No   Sexual activity: Not on file  Other Topics Concern   Not on file  Social History Narrative   Has living will   Husband is health care POA   Would accept resuscitation attempts   No feeding tube if cognitively unaware   Social Determinants of Health   Financial Resource Strain: Not on file  Food Insecurity: Not on file  Transportation Needs: Not on file  Physical Activity: Not on file  Stress: Not on file  Social Connections: Not on file  Intimate Partner Violence: Not on file    FAMILY HISTORY: Family History  Problem Relation Age of Onset   Breast cancer Maternal Aunt    Anuerysm Mother    Stroke Mother    Heart attack Father    Alzheimer's disease Father    Developmental delay Brother     ALLERGIES:  is allergic to levofloxacin and other.  MEDICATIONS:  Current Outpatient Medications  Medication Sig Dispense Refill   acetaminophen (TYLENOL) 650 MG CR tablet Take 1,300 mg by mouth at bedtime.     albuterol (VENTOLIN HFA) 108 (90 Base) MCG/ACT inhaler Inhale 2 puffs into the lungs every 4 (four) hours as needed for wheezing or shortness of breath.     buPROPion (WELLBUTRIN SR) 200 MG 12 hr tablet TAKE 1 TABLET(200 MG) BY MOUTH TWICE DAILY 60 tablet 11   busPIRone (BUSPAR) 15 MG tablet Take 1 tablet (15 mg total) by mouth 2 (two) times daily. 180 tablet 3   Calcium Carbonate-Vit D-Min (CALTRATE MINIS PLUS MINERALS PO) Take 40 mcg by mouth daily.     Carboxymethylcellulose Sodium (THERATEARS) 0.25 % SOLN Place 1 drop into both eyes daily as needed (Dry eyes).     Collagen-Boron-Hyaluronic Acid (MOVE FREE ULTRA JOINT HEALTH) 40-5-3.3 MG TABS Take 1 tablet by mouth every evening.     CRANBERRY PO Take 4,200 mg by mouth daily at 12 noon.      diclofenac Sodium (VOLTAREN) 1 % GEL Apply 1 application topically 2 (two) times daily.     diltiazem (CARDIZEM CD) 120 MG 24 hr capsule Take 120 mg by mouth daily.     docusate sodium (COLACE) 100 MG capsule Take 100 mg by mouth 2 (two) times daily.     fluocinonide cream (LIDEX) 4.13 % Apply 1 application topically daily as needed (Eczema).     gabapentin (NEURONTIN) 100 MG capsule Take 200 mg by mouth 3 (three) times daily.     Ivermectin 1 % CREA Apply 1 application topically daily. On face     levothyroxine (SYNTHROID, LEVOTHROID) 50 MCG tablet Take 50-75 mcg by mouth See admin instructions. Take 75 mg every  Monday, Wednesday and Friday Take 50 mg all the  other days     liothyronine (CYTOMEL) 5 MCG tablet Take 2.5 mcg by mouth daily.     meclizine (ANTIVERT) 25 MG tablet Take 25 mg by mouth daily as needed (Dizzyness).     Melatonin 10 MG TABS Take 10 mg by mouth at bedtime. Timed-Release     montelukast (SINGULAIR) 10 MG tablet Take 1 tablet (10 mg total) by mouth at bedtime. 90 tablet 3   Multiple Vitamin (MULTIVITAMIN) tablet Take 1 tablet by mouth daily. Vita Fusion adult Gummie     ondansetron (ZOFRAN) 4 MG tablet Take 1 tablet (4 mg total) by mouth every 6 (six) hours. 12 tablet 0   pantoprazole (PROTONIX) 40 MG tablet Take 40 mg by mouth every morning.     Polyethyl Glycol-Propyl Glycol (SYSTANE) 0.4-0.3 % GEL ophthalmic gel Place 1 application into both eyes at bedtime.     polyethylene glycol powder (GLYCOLAX/MIRALAX) 17 GM/SCOOP powder Take 17 g by mouth daily.     pravastatin (PRAVACHOL) 40 MG tablet Take 2 tablets (80 mg total) by mouth daily. 180 tablet 3   PREMPRO 0.45-1.5 MG tablet Take 1 tablet by mouth daily.     Probiotic Product ([place]) CAPS Take 1 capsule by mouth at bedtime.     saccharomyces boulardii (FLORASTOR) 250 MG capsule Take 500 mg by mouth daily.     sodium chloride (OCEAN) 0.65 % SOLN nasal spray Place 1 spray into both nostrils 2 (two)  times daily.     traZODone (DESYREL) 50 MG tablet TAKE 1 TABLET AT BEDTIME 90 tablet 3   WIXELA INHUB 100-50 MCG/ACT AEPB USE 1 INHALATION TWICE A DAY 60 each 1   solifenacin (VESICARE) 10 MG tablet Take 10 mg by mouth every other day. 1530 (Patient not taking: Reported on 01/06/2022)     No current facility-administered medications for this visit.    REVIEW OF SYSTEMS:   Pertinent information mentioned in HPI All other systems were reviewed with the patient and are negative.  PHYSICAL EXAMINATION: ECOG PERFORMANCE STATUS: 1 - Symptomatic but completely ambulatory  Vitals:   01/06/22 1419  BP: 132/70  Pulse: 99  Resp: 18  Temp: 99.2 F (37.3 C)  SpO2: 97%   Filed Weights   01/06/22 1419  Weight: 143 lb 4.8 oz (65 kg)    GENERAL:alert, no distress and comfortable SKIN: skin color, texture, turgor are normal, no rashes or significant lesions EYES: normal, conjunctiva are pink and non-injected, sclera clear OROPHARYNX:no exudate, no erythema and lips, buccal mucosa, and tongue normal  NECK: supple, thyroid normal size, non-tender, without nodularity LYMPH:  no palpable lymphadenopathy in the cervical, axillary or inguinal LUNGS: clear to auscultation and percussion with normal breathing effort HEART: regular rate & rhythm and no murmurs and no lower extremity edema ABDOMEN:abdomen soft, non-tender and normal bowel sounds Musculoskeletal:no cyanosis of digits and no clubbing  PSYCH: alert & oriented x 3 with fluent speech NEURO: no focal motor/sensory deficits  LABORATORY DATA:  I have reviewed the data as listed Lab Results  Component Value Date   WBC 5.3 07/06/2020   HGB 12.3 07/06/2020   HCT 36.8 07/06/2020   MCV 93.6 07/06/2020   PLT 223 07/06/2020   No results for input(s): "NA", "K", "CL", "CO2", "GLUCOSE", "BUN", "CREATININE", "CALCIUM", "GFRNONAA", "GFRAA", "PROT", "ALBUMIN", "AST", "ALT", "ALKPHOS", "BILITOT", "BILIDIR", "IBILI" in the last 8760  hours.  RADIOGRAPHIC STUDIES: I have personally reviewed the radiological images as listed and agreed with the findings in the report.  No results found.  Patient denies fever, chills, nausea, vomiting, shortness of breath, cough, night sweats, abdominal pain, bleeding, bowel or bladder issues. Appetite is good.  Denies any weight loss.   REVIEW OF SYSTEMS:   ROS  As per HPI. Otherwise, a complete review of systems is negative.  PAST MEDICAL HISTORY: Past Medical History:  Diagnosis Date   Anxiety    Arthritis    Asthma    Cataract    Depression    Diverticulitis    GERD (gastroesophageal reflux disease)    Hodgkin's lymphoma ([place]) 2006   underwent radiation and chemo, last CT scan since moving to [place] from [place]   Hyperlipidemia    Hypothyroidism    Menopausal syndrome    OAB (overactive bladder)    Pneumonia    Seasonal allergies    SVT (supraventricular tachycardia) (HCC)    Thyroid disease    hypothyroid    PAST SURGICAL HISTORY: Past Surgical History:  Procedure Laterality Date   BLEPHAROPLASTY Bilateral    BREAST BIOPSY     benign   BREAST BIOPSY  07/27/2018   [place] core bx venus COLUMNAR CELL METAPLASIA, AND PSEUDOANGIOMATOUS STROMAL   CARPAL TUNNEL RELEASE Right    CARPAL TUNNEL RELEASE Left    CATARACT EXTRACTION Bilateral 2015   CLEFT LIP REPAIR     COLONOSCOPY WITH PROPOFOL N/A 07/09/2018   Procedure: COLONOSCOPY WITH PROPOFOL;  Surgeon: Lollie Sails, MD;  Location[facility] ENDOSCOPY;  Service: Endoscopy;  Laterality: N/A;   DE QUERVAIN'S RELEASE Bilateral    DIAGNOSTIC LAPAROSCOPY     endometriosis   ESOPHAGOGASTRODUODENOSCOPY N/A 07/09/2018   Procedure: ESOPHAGOGASTRODUODENOSCOPY (EGD);  Surgeon: Lollie Sails, MD;  Location: [facility] ENDOSCOPY;  Service: Endoscopy;  Laterality: N/A;   FINGER SURGERY Right    Middle finger joint replaced   MAXIMUM ACCESS (MAS) TRANSFORAMINAL LUMBAR INTERBODY FUSION (TLIF) 2 LEVEL Right 07/06/2020   Procedure: L4/5,  L5/S1 TRANSFORAMINAL LUMBAR INTERBODY FUSION (TLIF), L4-S1 PEDICLE SCREWS,DECOMPRESSION ;  Surgeon: Deetta Perla, MD;  Location: [facility] ORS;  Service: Neurosurgery;  Laterality: Right;   THUMB SURGERY Right    took tendon from forearm and wrapped it around the thumb joint    FAMILY HISTORY: Family History  Problem Relation Age of Onset   Breast cancer Maternal Aunt    Anuerysm Mother    Stroke Mother    Heart attack Father    Alzheimer's disease Father    Developmental delay Brother     HEALTH MAINTENANCE: Social History   Tobacco Use   Smoking status: Never    Passive exposure: Past   Smokeless tobacco: Never  Vaping Use   Vaping Use: Never used  Substance Use Topics   Alcohol use: Yes    Comment: very occasional   Drug use: No     Allergies  Allergen Reactions   Levofloxacin     IV Levaquin causes burning   Other Itching    Surgical glue    Current Outpatient Medications  Medication Sig Dispense Refill   acetaminophen (TYLENOL) 650 MG CR tablet Take 1,300 mg by mouth at bedtime.     albuterol (VENTOLIN HFA) 108 (90 Base) MCG/ACT inhaler Inhale 2 puffs into the lungs every 4 (four) hours as needed for wheezing or shortness of breath.     buPROPion (WELLBUTRIN SR) 200 MG 12 hr tablet TAKE 1 TABLET(200 MG) BY MOUTH TWICE DAILY 60 tablet 11   busPIRone (BUSPAR) 15 MG tablet Take 1 tablet (15 mg total) by  mouth 2 (two) times daily. 180 tablet 3   Calcium Carbonate-Vit D-Min (CALTRATE MINIS PLUS MINERALS PO) Take 40 mcg by mouth daily.     Carboxymethylcellulose Sodium (THERATEARS) 0.25 % SOLN Place 1 drop into both eyes daily as needed (Dry eyes).     Collagen-Boron-Hyaluronic Acid (MOVE FREE ULTRA JOINT HEALTH) 40-5-3.3 MG TABS Take 1 tablet by mouth every evening.     CRANBERRY PO Take 4,200 mg by mouth daily at 12 noon.     diclofenac Sodium (VOLTAREN) 1 % GEL Apply 1 application topically 2 (two) times daily.     diltiazem (CARDIZEM CD) 120 MG 24 hr capsule Take 120  mg by mouth daily.     docusate sodium (COLACE) 100 MG capsule Take 100 mg by mouth 2 (two) times daily.     fluocinonide cream (LIDEX) 2.29 % Apply 1 application topically daily as needed (Eczema).     gabapentin (NEURONTIN) 100 MG capsule Take 200 mg by mouth 3 (three) times daily.     Ivermectin 1 % CREA Apply 1 application topically daily. On face     levothyroxine (SYNTHROID, LEVOTHROID) 50 MCG tablet Take 50-75 mcg by mouth See admin instructions. Take 75 mg every  Monday, Wednesday and Friday Take 50 mg all the other days     liothyronine (CYTOMEL) 5 MCG tablet Take 2.5 mcg by mouth daily.     meclizine (ANTIVERT) 25 MG tablet Take 25 mg by mouth daily as needed (Dizzyness).     Melatonin 10 MG TABS Take 10 mg by mouth at bedtime. Timed-Release     montelukast (SINGULAIR) 10 MG tablet Take 1 tablet (10 mg total) by mouth at bedtime. 90 tablet 3   Multiple Vitamin (MULTIVITAMIN) tablet Take 1 tablet by mouth daily. Vita Fusion adult Gummie     ondansetron (ZOFRAN) 4 MG tablet Take 1 tablet (4 mg total) by mouth every 6 (six) hours. 12 tablet 0   pantoprazole (PROTONIX) 40 MG tablet Take 40 mg by mouth every morning.     Polyethyl Glycol-Propyl Glycol (SYSTANE) 0.4-0.3 % GEL ophthalmic gel Place 1 application into both eyes at bedtime.     polyethylene glycol powder (GLYCOLAX/MIRALAX) 17 GM/SCOOP powder Take 17 g by mouth daily.     pravastatin (PRAVACHOL) 40 MG tablet Take 2 tablets (80 mg total) by mouth daily. 180 tablet 3   PREMPRO 0.45-1.5 MG tablet Take 1 tablet by mouth daily.     Probiotic Product ([place]) CAPS Take 1 capsule by mouth at bedtime.     saccharomyces boulardii (FLORASTOR) 250 MG capsule Take 500 mg by mouth daily.     sodium chloride (OCEAN) 0.65 % SOLN nasal spray Place 1 spray into both nostrils 2 (two) times daily.     traZODone (DESYREL) 50 MG tablet TAKE 1 TABLET AT BEDTIME 90 tablet 3   WIXELA INHUB 100-50 MCG/ACT AEPB USE 1 INHALATION TWICE A DAY  60 each 1   solifenacin (VESICARE) 10 MG tablet Take 10 mg by mouth every other day. 1530 (Patient not taking: Reported on 01/06/2022)     No current facility-administered medications for this visit.    OBJECTIVE: Vitals:   01/06/22 1419  BP: 132/70  Pulse: 99  Resp: 18  Temp: 99.2 F (37.3 C)  SpO2: 97%     Body mass index is 24.6 kg/m.      General: Well-developed, well-nourished, no acute distress. Eyes: Pink conjunctiva, anicteric sclera. HEENT: Normocephalic, moist mucous membranes, clear oropharnyx. Lungs:  Clear to auscultation bilaterally. Heart: Regular rate and rhythm. No rubs, murmurs, or gallops. Abdomen: Soft, nontender, nondistended. No organomegaly noted, normoactive bowel sounds. Musculoskeletal: No edema, cyanosis, or clubbing. Neuro: Alert, answering all questions appropriately. Cranial nerves grossly intact. Skin: No rashes or petechiae noted. Psych: Normal affect. Lymphatics: No cervical, calvicular, axillary or inguinal LAD.   LAB RESULTS:  Lab Results  Component Value Date   NA 131 (L) 06/02/2020   K 4.1 06/02/2020   CL 94 (L) 06/02/2020   CO2 28 06/02/2020   GLUCOSE 81 06/02/2020   BUN 9 06/02/2020   CREATININE 0.83 06/02/2020   CALCIUM 9.3 06/02/2020   PROT 7.0 06/23/2018   ALBUMIN 4.2 06/23/2018   AST 25 06/23/2018   ALT 22 06/23/2018   ALKPHOS 53 06/23/2018   BILITOT 0.7 06/23/2018   GFRNONAA >60 06/02/2020   GFRAA >60 06/23/2018    Lab Results  Component Value Date   WBC 5.3 07/06/2020   NEUTROABS 4.4 03/06/2017   HGB 12.3 07/06/2020   HCT 36.8 07/06/2020   MCV 93.6 07/06/2020   PLT 223 07/06/2020    No results found for: "TIBC", "FERRITIN", "IRONPCTSAT"   STUDIES: No results found.  ASSESSMENT AND PLAN:   Adya Wirz is a 71 y.o. female with pmh of Hodgkin's lymphoma, diverticulitis, GERD, SVT, hypothyroidism was seen today for surveillance follow-up.  # History of Hodgkin lymphoma  - She was diagnosed with Hodgkin's  lymphoma from a neck lymph node biopsy in December 2006.  She received chemotherapy with ABVD starting in December 2006 and completing in March 2007.  She also had adjuvant XRT in May and June 2007.  She had multiple PET scans after revealing complete remission.  She had a treatment in [place].  She was seen by Dr. Grayland Ormond in 2018 when she was admitted to ED for shortness of breath.  CT neck, chest, abdomen pelvis did not show any concerns for recurrence at that time.    -Patient wanted to be checked out because she has been having bladder/bowel issues and right shoulder pain for some time now.  Patient denies any B symptoms or palpable lumps. -Exam was unremarkable.  Will defer CT imaging at this time. -Obtain CBC and CMP.  Patient reports she has lab appointment on 01/10/22 with her endocrinologist.  Advised to let them know to draw our labs.   Patient would like to keep as needed follow-up.  Patient expressed understanding and was in agreement with this plan. She also understands that She can call clinic at any time with any questions, concerns, or complaints.   I spent a total of 35 minutes reviewing chart data, face-to-face evaluation with the patient, counseling and coordination of care as detailed above.  Jane Canary, MD   01/06/2022 2:46 PM

## 2022-01-06 NOTE — Progress Notes (Signed)
Patient here today for follow up visit regarding lymphoma.

## 2022-01-13 ENCOUNTER — Encounter: Payer: Medicare Other | Admitting: Physical Therapy

## 2022-01-27 ENCOUNTER — Encounter: Payer: Medicare Other | Admitting: Physical Therapy

## 2022-02-04 ENCOUNTER — Encounter: Payer: Self-pay | Admitting: Internal Medicine

## 2022-02-04 ENCOUNTER — Ambulatory Visit (INDEPENDENT_AMBULATORY_CARE_PROVIDER_SITE_OTHER): Payer: Medicare Other | Admitting: Internal Medicine

## 2022-02-04 VITALS — BP 130/80 | HR 108 | Temp 97.4°F | Ht 63.5 in | Wt 142.0 lb

## 2022-02-04 DIAGNOSIS — I700 Atherosclerosis of aorta: Secondary | ICD-10-CM

## 2022-02-04 DIAGNOSIS — Z0000 Encounter for general adult medical examination without abnormal findings: Secondary | ICD-10-CM | POA: Diagnosis not present

## 2022-02-04 DIAGNOSIS — J4520 Mild intermittent asthma, uncomplicated: Secondary | ICD-10-CM | POA: Diagnosis not present

## 2022-02-04 DIAGNOSIS — Z23 Encounter for immunization: Secondary | ICD-10-CM

## 2022-02-04 DIAGNOSIS — F39 Unspecified mood [affective] disorder: Secondary | ICD-10-CM

## 2022-02-04 DIAGNOSIS — N951 Menopausal and female climacteric states: Secondary | ICD-10-CM | POA: Diagnosis not present

## 2022-02-04 DIAGNOSIS — E039 Hypothyroidism, unspecified: Secondary | ICD-10-CM | POA: Insufficient documentation

## 2022-02-04 DIAGNOSIS — I471 Supraventricular tachycardia, unspecified: Secondary | ICD-10-CM | POA: Diagnosis not present

## 2022-02-04 LAB — COMPREHENSIVE METABOLIC PANEL
ALT: 24 U/L (ref 0–35)
AST: 26 U/L (ref 0–37)
Albumin: 4.6 g/dL (ref 3.5–5.2)
Alkaline Phosphatase: 46 U/L (ref 39–117)
BUN: 8 mg/dL (ref 6–23)
CO2: 28 mEq/L (ref 19–32)
Calcium: 9.8 mg/dL (ref 8.4–10.5)
Chloride: 95 mEq/L — ABNORMAL LOW (ref 96–112)
Creatinine, Ser: 0.9 mg/dL (ref 0.40–1.20)
GFR: 65.43 mL/min (ref >60.00)
Glucose, Bld: 82 mg/dL (ref 70–99)
Potassium: 4.5 mEq/L (ref 3.5–5.1)
Sodium: 130 mEq/L — ABNORMAL LOW (ref 135–145)
Total Bilirubin: 0.4 mg/dL (ref 0.2–1.2)
Total Protein: 7.3 g/dL (ref 6.0–8.3)

## 2022-02-04 LAB — T3, FREE: T3, Free: 3.2 pg/mL (ref 2.3–4.2)

## 2022-02-04 LAB — CBC
HCT: 38 % (ref 36.0–46.0)
Hemoglobin: 12.6 g/dL (ref 12.0–15.0)
MCHC: 33.3 g/dL (ref 30.0–36.0)
MCV: 95.2 fl (ref 78.0–100.0)
Platelets: 305 10*3/uL (ref 150.0–400.0)
RBC: 3.99 Mil/uL (ref 3.87–5.11)
RDW: 14.7 % (ref 11.5–15.5)
WBC: 4.3 10*3/uL (ref 4.0–10.5)

## 2022-02-04 LAB — LIPID PANEL
Cholesterol: 196 mg/dL (ref 0–200)
HDL: 99.6 mg/dL (ref >39.00)
LDL Cholesterol: 77 mg/dL (ref 0–99)
NonHDL: 96.52
Total CHOL/HDL Ratio: 2
Triglycerides: 99 mg/dL (ref 0.0–149.0)
VLDL: 19.8 mg/dL (ref 0.0–40.0)

## 2022-02-04 LAB — TSH: TSH: 2.85 u[IU]/mL (ref 0.35–5.50)

## 2022-02-04 LAB — T4, FREE: Free T4: 0.75 ng/dL (ref 0.60–1.60)

## 2022-02-04 MED ORDER — AZELASTINE HCL 0.1 % NA SOLN: 2.0000 | Freq: Two times a day (BID) | NASAL | 3 refills | Status: DC

## 2022-02-04 MED ORDER — FLUTICASONE-SALMETEROL 100-50 MCG/ACT IN AEPB: INHALATION_SPRAY | RESPIRATORY_TRACT | 3 refills | Status: DC

## 2022-02-04 NOTE — Progress Notes (Signed)
Subjective:    Patient ID: Andrea Noble, female    DOB: [birth year], 71 y.o.   MRN: [id number]  HPI Here for Medicare wellness visit and follow up of chronic health conditions Reviewed advanced directives Reviewed other doctors----Dr [facility] surgeon, Dr Casimiro Needle, Dr Arline Asp, Chana Bode, Dr Darcus Pester, Dr Schermerhorn--gyn, Dr Agrawal--oncology, Dr Patel--rheumatology, Dr Azzie Almas, Dr Georgina Peer, Dr Kowalski--cardiology, Dr Cook--neurosurgery, Dr Bobbie Stack No surgery or hospitalizations in the past year Vision is okay Hearing is fine Rare alcohol No tobacco Exercises regularly 1 fall--no sig injury Independent with instrumental ADLs No sig memory problems  May need right shoulder replacement soon--Dr Ashley Akin Dr Peggye Ley about right arm pain--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   Now on wixela inhaler This controls the asthma Is having recurrent sinus symptoms Got azelastine from walk in clinic--this has helped Only occ cough--mostly at bedtime (uses mucinex DM and it helps)  Keeps knocking her head---like standing up after leaning over Actually fell over backwards once No trouble walking or with balance  Had urinary symptoms---went to [facility] urgent care ("soreness") Found microscopic hematuria No referral made as yet Still on solfenacin---~ every other day On prempro from gyn  Known aortic atherosclerosis On pravastatin  No palpitations No chest pain or SOB Does walk every day---will get hot at times No dizziness or syncope  Mood is pretty good Back to singing in the choir Did see counselor for a while--now finished Still on meds Episodic sad feelings--if she doesn't get out No anhedonia Current Outpatient Medications on File Prior to Visit  Medication Sig Dispense Refill   acetaminophen (TYLENOL) 650 MG CR tablet Take 1,300 mg by mouth at bedtime.     albuterol (VENTOLIN HFA) 108 (90 Base) MCG/ACT inhaler Inhale 2 puffs into the lungs  every 4 (four) hours as needed for wheezing or shortness of breath.     buPROPion (WELLBUTRIN SR) 200 MG 12 hr tablet TAKE 1 TABLET(200 MG) BY MOUTH TWICE DAILY 60 tablet 11   busPIRone (BUSPAR) 15 MG tablet Take 1 tablet (15 mg total) by mouth 2 (two) times daily. 180 tablet 3   Calcium Carbonate-Vit D-Min (CALTRATE MINIS PLUS MINERALS PO) Take 40 mcg by mouth daily.     Carboxymethylcellulose Sodium (THERATEARS) 0.25 % SOLN Place 1 drop into both eyes daily as needed (Dry eyes).     Collagen-Boron-Hyaluronic Acid (MOVE FREE ULTRA JOINT HEALTH) 40-5-3.3 MG TABS Take 1 tablet by mouth every evening.     CRANBERRY PO Take 4,200 mg by mouth daily at 12 noon.     diclofenac Sodium (VOLTAREN) 1 % GEL Apply 1 application topically 2 (two) times daily.     diltiazem (CARDIZEM CD) 120 MG 24 hr capsule Take 120 mg by mouth daily.     docusate sodium (COLACE) 100 MG capsule Take 100 mg by mouth 2 (two) times daily.     fluocinonide cream (LIDEX) 8.10 % Apply 1 application topically daily as needed (Eczema).     gabapentin (NEURONTIN) 100 MG capsule Take 200 mg by mouth 3 (three) times daily.     Ivermectin 1 % CREA Apply 1 application topically daily. On face     levothyroxine (SYNTHROID, LEVOTHROID) 50 MCG tablet Take 50-75 mcg by mouth See admin instructions. Take 75 mg every  Monday, Wednesday and Friday Take 50 mg all the other days     liothyronine (CYTOMEL) 5 MCG tablet Take 2.5 mcg by mouth daily.     meclizine (ANTIVERT) 25 MG tablet Take 25 mg by mouth daily  as needed (Dizzyness).     Melatonin 10 MG TABS Take 10 mg by mouth at bedtime. Timed-Release     montelukast (SINGULAIR) 10 MG tablet Take 1 tablet (10 mg total) by mouth at bedtime. 90 tablet 3   Multiple Vitamin (MULTIVITAMIN) tablet Take 1 tablet by mouth daily. Vita Fusion adult Gummie     ondansetron (ZOFRAN) 4 MG tablet Take 1 tablet (4 mg total) by mouth every 6 (six) hours. 12 tablet 0   pantoprazole (PROTONIX) 40 MG tablet Take 40  mg by mouth every morning.     Polyethyl Glycol-Propyl Glycol (SYSTANE) 0.4-0.3 % GEL ophthalmic gel Place 1 application into both eyes at bedtime.     polyethylene glycol powder (GLYCOLAX/MIRALAX) 17 GM/SCOOP powder Take 17 g by mouth daily.     pravastatin (PRAVACHOL) 40 MG tablet Take 2 tablets (80 mg total) by mouth daily. 180 tablet 3   PREMPRO 0.45-1.5 MG tablet Take 1 tablet by mouth daily.     Probiotic Product (Donaldson) CAPS Take 1 capsule by mouth at bedtime.     saccharomyces boulardii (FLORASTOR) 250 MG capsule Take 500 mg by mouth daily.     sodium chloride (OCEAN) 0.65 % SOLN nasal spray Place 1 spray into both nostrils 2 (two) times daily.     solifenacin (VESICARE) 10 MG tablet Take 10 mg by mouth every other day. 1530     traZODone (DESYREL) 50 MG tablet TAKE 1 TABLET AT BEDTIME 90 tablet 3   WIXELA INHUB 100-50 MCG/ACT AEPB USE 1 INHALATION TWICE A DAY 60 each 1   No current facility-administered medications on file prior to visit.    Allergies  Allergen Reactions   Levofloxacin     IV Levaquin causes burning   Other Itching    Surgical glue    Past Medical History:  Diagnosis Date   Anxiety    Arthritis    Asthma    Cataract    Depression    Diverticulitis    GERD (gastroesophageal reflux disease)    Hodgkin's lymphoma ([place]) 2006   underwent radiation and chemo, last CT scan since moving to  from [place]   Hyperlipidemia    Hypothyroidism    Menopausal syndrome    OAB (overactive bladder)    Pneumonia    Seasonal allergies    SVT (supraventricular tachycardia)    Thyroid disease    hypothyroid    Past Surgical History:  Procedure Laterality Date   BLEPHAROPLASTY Bilateral    BREAST BIOPSY     benign   BREAST BIOPSY  07/27/2018   [place] core bx venus COLUMNAR CELL METAPLASIA, AND PSEUDOANGIOMATOUS STROMAL   CARPAL TUNNEL RELEASE Right    CARPAL TUNNEL RELEASE Left    CATARACT EXTRACTION Bilateral 2015   CLEFT LIP REPAIR      COLONOSCOPY WITH PROPOFOL N/A 07/09/2018   Procedure: COLONOSCOPY WITH PROPOFOL;  Surgeon: Lollie Sails, MD;  Location: [facility] ENDOSCOPY;  Service: Endoscopy;  Laterality: N/A;   DE QUERVAIN'S RELEASE Bilateral    DIAGNOSTIC LAPAROSCOPY     endometriosis   ESOPHAGOGASTRODUODENOSCOPY N/A 07/09/2018   Procedure: ESOPHAGOGASTRODUODENOSCOPY (EGD);  Surgeon: Lollie Sails, MD;  [facility] END[facility]COPY;  Service: Endoscopy;  Laterality: N/A;   FINGER SURGERY Right    Middle finger joint replaced   MAXIMUM ACCESS (MAS) TRANSFORAMINAL LUMBAR INTERBODY FUSION (TLIF) 2 LEVEL Right 07/06/2020   Procedure: L4/5, L5/S1 TRANSFORAMINAL LUMBAR INTERBODY FUSION (TLIF), L4-S1 PEDICLE SCREWS,DECOMPRESSION ;  Surgeon: Deetta Perla,  MD;  Location: [facility] ORS;  Service: Neurosurgery;  Laterality: Right;   THUMB SURGERY Right    took tendon from forearm and wrapped it around the thumb joint    Family History  Problem Relation Age of Onset   Breast cancer Maternal Aunt    Anuerysm Mother    Stroke Mother    Heart attack Father    Alzheimer's disease Father    Developmental delay Brother     Social History   Socioeconomic History   Marital status: Married    Spouse name: Dwight   Number of children: 0   Years of education: Not on file   Highest education level: Not on file  Occupational History   Occupation: Social work/chaplain    Comment: 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 [place]  Tobacco Use   Smoking status: Never    Passive exposure: Past   Smokeless tobacco: Never  Vaping Use   Vaping Use: Never used  Substance and Sexual Activity   Alcohol use: Yes    Comment: very occasional   Drug use: No   Sexual activity: Not on file  Other Topics Concern   Not on file  Social History Narrative   Has living will   Husband is health care POA-- sister Margaret [place] is alternate   Would accept resuscitation attempts   No feeding tube if cognitively unaware   Social Determinants of Health   Financial  Resource Strain: Not on file  Food Insecurity: Not on file  Transportation Needs: Not on file  Physical Activity: Not on file  Stress: Not on file  Social Connections: Not on file  Intimate Partner Violence: Not on file   Review of Systems Appetite is good--likes sweets Weight is stable Sleep is variable--nocturia at times Wears seat belt Teeth are okay---recent crown redo Taking miralax every other day/stool softener daily---some gas problems Some heartburn if she skips pantoprazole. No dysphagia Other chronic joint issues Ongoing facial bumps--they concern her    Objective:   Physical Exam Constitutional:      Appearance: Normal appearance.  HENT:     Mouth/Throat:     Comments: No lesions Eyes:     Conjunctiva/sclera: Conjunctivae normal.     Pupils: Pupils are equal, round, and reactive to light.  Cardiovascular:     Rate and Rhythm: Normal rate and regular rhythm.     Pulses: Normal pulses.     Heart sounds: No murmur heard.    No gallop.  Pulmonary:     Effort: Pulmonary effort is normal.     Breath sounds: Normal breath sounds. No wheezing or rales.  Abdominal:     Palpations: Abdomen is soft.     Tenderness: There is no abdominal tenderness.  Musculoskeletal:     Cervical back: Neck supple.     Right lower leg: No edema.     Left lower leg: No edema.  Lymphadenopathy:     Cervical: No cervical adenopathy.  Skin:    Findings: No lesion or rash.     Comments: Benign flesh colored papules on right cheek  Neurological:     General: No focal deficit present.     Mental Status: She is alert and oriented to person, place, and time.     Comments: Mini-cog--normal  Psychiatric:        Mood and Affect: Mood normal.        Behavior: Behavior normal.            Assessment & Plan:

## 2022-02-04 NOTE — Progress Notes (Signed)
Hearing Screening - Comments:: Passed whisper test Vision Screening - Comments:: June 2023?  

## 2022-02-04 NOTE — Assessment & Plan Note (Signed)
No recurrence on the diltiazem 120 daily

## 2022-02-04 NOTE — Assessment & Plan Note (Signed)
On imaging Is on pravastatin 40

## 2022-02-04 NOTE — Assessment & Plan Note (Signed)
Using the prempro

## 2022-02-04 NOTE — Assessment & Plan Note (Signed)
Controlled with wixela 100/50 bid Now uses azelastine for sinus Hasn't needed albuterol

## 2022-02-04 NOTE — Addendum Note (Signed)
Addended by: Pilar Grammes on: 02/04/2022 12:42 PM   Modules accepted: Orders

## 2022-02-04 NOTE — Assessment & Plan Note (Signed)
Mild persistent symptoms controlled with buspirone 15 bid and bupropion 200 bid

## 2022-02-04 NOTE — Assessment & Plan Note (Signed)
I have personally reviewed the Medicare Annual Wellness questionnaire and have noted 1. The patient's medical and social history 2. Their use of alcohol, tobacco or illicit drugs 3. Their current medications and supplements 4. The patient's functional ability including ADL's, fall risks, home safety risks and hearing or visual             impairment. 5. Diet and physical activities 6. Evidence for depression or mood disorders  The patients weight, height, BMI and visual acuity have been recorded in the chart I have made referrals, counseling and provided education to the patient based review of the above and I have provided the pt with a written personalized care plan for preventive services.  I have provided you with a copy of your personalized plan for preventive services. Please take the time to review along with your updated medication list.  Does exercise Colon due 2025 Yearly mammogram-- at least till 67 Done with paps Flu vaccine today Updated COVID and Td at the pharmacy

## 2022-02-04 NOTE — Assessment & Plan Note (Signed)
On levothyroxine and cytomel Will check labs

## 2022-02-07 ENCOUNTER — Telehealth: Payer: Self-pay | Admitting: Internal Medicine

## 2022-02-07 NOTE — Telephone Encounter (Signed)
Patient called in and wanted to speak with someone to discuss her going back on Pro-Air. She can be reached at [phone]. Thank you!

## 2022-02-07 NOTE — Telephone Encounter (Signed)
Pt says a previous dr had written this for her. Asking if Dr Silvio Pate would approve a rx to [facility] for her.

## 2022-02-08 MED ORDER — ALBUTEROL SULFATE HFA 108 (90 BASE) MCG/ACT IN AERS: 2.0000 | INHALATION_SPRAY | RESPIRATORY_TRACT | 2 refills | Status: DC | PRN

## 2022-02-08 NOTE — Telephone Encounter (Signed)
Rx sent to [facility]. Left message on VM for pt to let her know.

## 2022-02-08 NOTE — Addendum Note (Signed)
Addended by: Pilar Grammes on: 02/08/2022 08:40 AM   Modules accepted: Orders

## 2022-02-10 ENCOUNTER — Ambulatory Visit: Payer: Medicare Other | Attending: Obstetrics and Gynecology | Admitting: Physical Therapy

## 2022-02-10 DIAGNOSIS — R279 Unspecified lack of coordination: Secondary | ICD-10-CM | POA: Diagnosis present

## 2022-02-10 DIAGNOSIS — M25511 Pain in right shoulder: Secondary | ICD-10-CM | POA: Insufficient documentation

## 2022-02-10 DIAGNOSIS — M25551 Pain in right hip: Secondary | ICD-10-CM | POA: Insufficient documentation

## 2022-02-10 DIAGNOSIS — G8929 Other chronic pain: Secondary | ICD-10-CM | POA: Diagnosis present

## 2022-02-10 DIAGNOSIS — M533 Sacrococcygeal disorders, not elsewhere classified: Secondary | ICD-10-CM | POA: Diagnosis present

## 2022-02-10 DIAGNOSIS — M4125 Other idiopathic scoliosis, thoracolumbar region: Secondary | ICD-10-CM | POA: Insufficient documentation

## 2022-02-10 NOTE — Therapy (Signed)
OUTPATIENT PHYSICAL THERAPY TREATMENT NOTE    Patient Name: Andrea Noble MRN: [id number] DOB:08-02-1950, 71 y.o., female Today's Date: 02/11/2022   REFERRING PROVIDER:  Schermerhorn    PT End of Session - 02/10/22 1555     Visit Number 11    Date for PT Re-Evaluation 02/11/22   PN 12/30/21   PT Start Time 1550    PT Stop Time 1640    PT Time Calculation (min) 50 min    Activity Tolerance Patient tolerated treatment well    Behavior During Therapy [facility] for tasks assessed/performed                  Past Medical History:  Diagnosis Date   Anxiety    Arthritis    Asthma    Cataract    Depression    Diverticulitis    GERD (gastroesophageal reflux disease)    Hodgkin's lymphoma ([place]) 2006   underwent radiation and chemo, last CT scan since moving to [place] from [place]   Hyperlipidemia    Hypothyroidism    Menopausal syndrome    OAB (overactive bladder)    Pneumonia    Seasonal allergies    SVT (supraventricular tachycardia)    Thyroid disease    hypothyroid   Past Surgical History:  Procedure Laterality Date   BLEPHAROPLASTY Bilateral    BREAST BIOPSY     benign   BREAST BIOPSY  07/27/2018   [place] core bx venus COLUMNAR CELL METAPLASIA, AND PSEUDOANGIOMATOUS STROMAL   CARPAL TUNNEL RELEASE Right    CARPAL TUNNEL RELEASE Left    CATARACT EXTRACTION Bilateral 2015   CLEFT LIP REPAIR     COLONOSCOPY WITH PROPOFOL N/A 07/09/2018   Procedure: COLONOSCOPY WITH PROPOFOL;  Surgeon: Lollie Sails, MD;  Location: [facility] ENDOSCOPY;  Service: Endoscopy;  Laterality: N/A;   DE QUERVAIN'S RELEASE Bilateral    DIAGNOSTIC LAPAROSCOPY     endometriosis   ESOPHAGOGASTRODUODENOSCOPY N/A 07/09/2018   Procedure: ESOPHAGOGASTRODUODENOSCOPY (EGD);  Surgeon: Lollie Sails, MD;  Location: [facility] ENDOSCOPY;  Service: Endoscopy;  Laterality: N/A;   FINGER SURGERY Right    Middle finger joint replaced   MAXIMUM ACCESS (MAS) TRANSFORAMINAL LUMBAR INTERBODY FUSION (TLIF) 2 LEVEL Right  07/06/2020   Procedure: L4/5, L5/S1 TRANSFORAMINAL LUMBAR INTERBODY FUSION (TLIF), L4-S1 PEDICLE SCREWS,DECOMPRESSION ;  Surgeon: Deetta Perla, MD;  Location: [facility] ORS;  Service: Neurosurgery;  Laterality: Right;   THUMB SURGERY Right    took tendon from forearm and wrapped it around the thumb joint   Patient Active Problem List   Diagnosis Date Noted   Hypothyroidism 02/04/2022   Preventative health care 02/03/2021   Mood disorder ([place]) 02/03/2021   Atherosclerosis of aorta ([place]) 02/03/2021   Intermittent asthma, well controlled 07/31/2020   SVT (supraventricular tachycardia)    Hyperlipidemia    Menopausal syndrome    OAB (overactive bladder)    Seasonal allergies    Lumbar radiculopathy 07/06/2020   History of Hodgkin's lymphoma 03/01/2017    REFERRING DIAG:             Urinary Urgency  THERAPY DIAG:  Other idiopathic scoliosis, thoracolumbar region  Chronic right shoulder pain  Pain in right hip  Unspecified lack of coordination  Sacrococcygeal disorders, not elsewhere classified  Rationale for Evaluation and Treatment Rehabilitation    PERTINENT HISTORY:    Sx reported on eval 09/13/21  1) urinary urgency/ fecal leakage: Sometimes pt has leakage 20% of the time.  Pt wear a pad as a precaution. Pt  feels the urge to urinate when going from sit to stand after sitting for 1-2 hours.  Pt would wake up and need to go to the bathroom and she when she gets to the toilet , she has soft stool with urination. Pt is taking Miralax Colace and she wants to take as minimal as possible. Pt has fecal leakage recently when taking the laxatives when sitting on the toilet. Pt has Hx of diverticulitis. Pt has not messed her pants.     2) LBP 1/10 today, at its worst 5/10,  located at B L/S junciton ( pt points to this area). pain is constant at low level. Started at least 20 years ago and she was on a step stool and reaching trying to paint and lost her balance and felt onto her knee.  Plus  pt also had skiing accident when she felt flat on her back. Pt underwent laminectomy surgery March 2022. Pt rteported she had  "screws." Pt had PT at [facility] where she [facility]. LBP occurs after period of standing > 30 min.      3) R hip bursitis started 2021 impacts her walking sometimes with going up stairs at home  with weight bearing .  Pt is not able to lie on her R side because of her R shoulder and R hip.      4) R shoulder pain for the past 5 years.  and limits with reaching behind her back and overheading.     Physical Walking: not able to walk daily  > 30 min but is not walking the distance she used to walk.   PRECAUTIONS:  fall risk   SUBJECTIVE:                           Pt reports blood in her urine has cleared up. Oncology did not find anything. She order two lab tests. Pt saw her PCP who also ordered blood work. Gynecologist cleared her from gynecological issues. Pt had fecal leakage     TODAY'S Assessment/  TREATMENT:   [facility] PT Assessment - 02/11/22 1243       Squat   Comments proper squat alignment with lifting      Other:   Other/ Comments simulated lifting carry on luggage,  pulling bags over overhead cabinet when traveling, . Pt reported pinching R shoulder pain, less pain when cued for retraction             [facility] Adult PT Treatment/Exercise - 02/11/22 1245       Therapeutic Activites    Other Therapeutic Activities reassessed goals, administered FOTO, discused body mechanics when traveling by plane      Neuro Re-ed    Neuro Re-ed Details  cued for techniques for less shoulder pain/ LBP withluggage handling when traveling               PATIENT EDUCATION: Education details: Explained muscles attachments/ connection, physiology of deep core system/ spinal- thoracic-pelvis-lower kinetic chain as they relate to pt's presentation, Sx, and past Hx. Explained what and how these areas of deficits need to be restored to balance and function   See  neuro-re-edu/ therapeutic activity section  Person educated: Patient Education method: Explanation, Demonstration, Tactile cues, and Verbal cues Education comprehension: verbalized understanding, returned demonstration, verbal cues required, and tactile cues required   HOME EXERCISE PROGRAM: See pt instruction section    PT Long Term Goals  PT LONG TERM GOAL #1   Title Pt will report decreased urgency to urinate after 1.5 hours of travelling, going from sit to stand ,  and without altered gait to the bathroom without leakage to demo improved continence and to visit step mom in [place] urge to urinate after 1-2 hours and uses altered gait pattern   ( 12/30/21: pt has not travelled)      Time 6    Period Weeks    Status On-going    Target Date 04/21/2022       PT LONG TERM GOAL #2   Title Pt will demo levelled pelvic girdle and shoulder and less scoliotic curve to progress to deep core HEP in order to improve postural stability for QOL    Baseline R shoudler and L iliac rest lower, convex T/L junction    Time 4    Period Weeks    Status Achieved    Target Date 10/11/21      PT LONG TERM GOAL #3   Title Pt will demo improved gait speed from 1.11 m/s to > 1.3 m/s and demo reciporcal gait pattern to progress to walking longer distance   ( 12/30/21: 1.6 m/s with reciprocal gait and pt has returned walking atleast 5 days a week) .    Time 6    Status Achieved    Target Date 01/04/22      PT LONG TERM GOAL #4   Title Pt will demo increased R hip LE from 4-/5 to 5/5  and no LBP with R sideflexion in order to sleep on R side with no R hip bursitis pain   (12/30/21: RLE 5/5)    Time 8    Period Weeks    Status Achieved    Target Date 01/18/22      PT LONG TERM GOAL #5   Title Pt will improve bowel leakage on FOTO from 62 pt by > 5 pt point change in order to demo more control of sphincter when urinating   (02/10/22: -6 pts)    Time 10    Status Not met     Target Date 04/21/2022       PT LONG TERM GOAL #6   Title Pt will report no difficulty / R shoulder pain when wiping for bowel movements   (12/30/21: 50% improved , 10 /12/23 :  65% improved  )    Time 5    Period Weeks    Status Partially achieved , pt plans to have shoulder surgery but will put it off as long as possible    Target Date 02/10/22                   Plan     Clinical Impression Statement    Pt reports blood in her urine has cleared up. Oncology did not find anything. She order two lab tests. Pt saw her PCP who also ordered blood work. Gynecologist cleared her from gynecological issues. Pt had fecal leakage.  Pt will be traveling to [place] for 4 weeks. Cued for techniques for less shoulder pain/ LBP withluggage handling when traveling .  Reviewed stretches. Modified mermaid due to hip restrictions in R hip ER/flex/ abd position. PLan to reassess SIJ mobility at next session and continue to address pelvic issues.    Pt continues to benefit from skilled PT    Personal Factors and Comorbidities Comorbidity 3+    Comorbidities Hodgkin Lymphoma in 2006 with  chemo, radiation Tx, scoliosis    Stability/Clinical Decision Making Evolving/Moderate complexity    Rehab Potential Good    PT Frequency 1x / week    PT Duration Other (comment)   10   PT Treatment/Interventions Neuromuscular re-education;Balance training;Therapeutic exercise;Gait training;Stair training;Functional mobility training;Therapeutic activities;Splinting;Taping;Patient/family education;Scar mobilization;Dry needling;Manual techniques;Electrical Stimulation;Cryotherapy;Moist Heat;ADLs/Self Care Home Management;Traction;Energy conservation    Consulted and Agree with Plan of Care Patient               Jerl Mina, PT 02/11/2022, 12:47 PM

## 2022-02-24 ENCOUNTER — Encounter: Payer: Medicare Other | Admitting: Physical Therapy

## 2022-03-10 ENCOUNTER — Encounter: Payer: Medicare Other | Admitting: Physical Therapy

## 2022-03-22 ENCOUNTER — Ambulatory Visit: Payer: Medicare Other | Attending: Obstetrics and Gynecology | Admitting: Physical Therapy

## 2022-03-23 ENCOUNTER — Encounter: Payer: Medicare Other | Admitting: Physical Therapy

## 2022-03-31 ENCOUNTER — Ambulatory Visit (INDEPENDENT_AMBULATORY_CARE_PROVIDER_SITE_OTHER): Payer: Medicare Other | Admitting: Internal Medicine

## 2022-03-31 ENCOUNTER — Encounter: Payer: Self-pay | Admitting: Internal Medicine

## 2022-03-31 ENCOUNTER — Other Ambulatory Visit: Payer: Self-pay | Admitting: Internal Medicine

## 2022-03-31 VITALS — BP 108/62 | HR 108 | Temp 97.3°F | Ht 63.5 in | Wt 147.0 lb

## 2022-03-31 DIAGNOSIS — F39 Unspecified mood [affective] disorder: Secondary | ICD-10-CM

## 2022-03-31 MED ORDER — GABAPENTIN 100 MG PO CAPS: 200.0000 mg | ORAL_CAPSULE | Freq: Three times a day (TID) | ORAL | 1 refills | Status: DC

## 2022-03-31 NOTE — Patient Instructions (Signed)
Please set up your screening mammogram. 
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
## 2022-03-31 NOTE — Assessment & Plan Note (Signed)
Having more stress and feels "overwhelmed" Will go ahead with referral to [facility] Continue trazodone for bedtime Bupropion 200 bid, buspar 15 bid for now

## 2022-04-07 ENCOUNTER — Other Ambulatory Visit: Payer: Self-pay | Admitting: Internal Medicine

## 2022-04-07 DIAGNOSIS — Z1231 Encounter for screening mammogram for malignant neoplasm of breast: Secondary | ICD-10-CM

## 2022-04-11 ENCOUNTER — Ambulatory Visit
Admission: RE | Admit: 2022-04-11 | Discharge: 2022-04-11 | Disposition: A | Discharge status: Home / Self Care | Payer: Medicare Other | Source: Ambulatory Visit | Attending: Internal Medicine | Admitting: Internal Medicine

## 2022-04-11 DIAGNOSIS — Z1231 Encounter for screening mammogram for malignant neoplasm of breast: Secondary | ICD-10-CM | POA: Diagnosis not present
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
## 2022-04-21 ENCOUNTER — Encounter: Payer: Self-pay | Admitting: Psychiatry

## 2022-04-21 ENCOUNTER — Ambulatory Visit (INDEPENDENT_AMBULATORY_CARE_PROVIDER_SITE_OTHER): Payer: Medicare Other | Admitting: Psychiatry

## 2022-04-21 VITALS — BP 162/81 | HR 109 | Temp 98.7°F | Ht 63.5 in | Wt 145.2 lb

## 2022-04-21 DIAGNOSIS — F331 Major depressive disorder, recurrent, moderate: Secondary | ICD-10-CM

## 2022-04-21 DIAGNOSIS — F4310 Post-traumatic stress disorder, unspecified: Secondary | ICD-10-CM | POA: Diagnosis not present

## 2022-04-21 DIAGNOSIS — G4700 Insomnia, unspecified: Secondary | ICD-10-CM

## 2022-04-21 MED ORDER — SERTRALINE HCL 50 MG PO TABS: ORAL_TABLET | ORAL | 0 refills | Status: DC

## 2022-04-21 MED ORDER — BUPROPION HCL ER (XL) 300 MG PO TB24: 300.0000 mg | ORAL_TABLET | Freq: Every day | ORAL | 0 refills | Status: DC

## 2022-04-21 MED ORDER — BUPROPION HCL ER (XL) 150 MG PO TB24: 150.0000 mg | ORAL_TABLET | Freq: Every day | ORAL | 0 refills | Status: DC

## 2022-04-21 NOTE — Patient Instructions (Signed)
Continue bupropion 200 mg twice a day  Start sertraline 25 mg at night for one week, then 50 mg at night Continue buspirone 15 mg twice a day Referral to therapy Referral for evaluation of sleep apnea  Next appointment: 2/12 at 3:30

## 2022-04-25 ENCOUNTER — Other Ambulatory Visit: Payer: Self-pay | Admitting: Internal Medicine

## 2022-05-13 ENCOUNTER — Other Ambulatory Visit: Payer: Self-pay | Admitting: Internal Medicine

## 2022-06-06 ENCOUNTER — Ambulatory Visit (INDEPENDENT_AMBULATORY_CARE_PROVIDER_SITE_OTHER): Payer: Medicare Other | Admitting: Adult Health

## 2022-06-06 ENCOUNTER — Encounter: Payer: Self-pay | Admitting: Adult Health

## 2022-06-06 VITALS — BP 138/80 | HR 104 | Temp 98.0°F | Ht 63.5 in | Wt 148.8 lb

## 2022-06-06 DIAGNOSIS — R0683 Snoring: Secondary | ICD-10-CM | POA: Diagnosis not present

## 2022-06-06 DIAGNOSIS — R400 Somnolence: Secondary | ICD-10-CM

## 2022-06-06 NOTE — Assessment & Plan Note (Signed)
Snoring, restless sleep, daytime sleepiness all suspicious for underlying sleep apnea.  Patient education on healthy sleep regimen.  Have asked her to cut down her caffeine intake.  No caffeine after noon.  Also to decrease her screen time.  Will set up for home sleep study to rule out underlying sleep apnea  - discussed how weight can impact sleep and risk for sleep disordered breathing - discussed options to assist with weight loss: combination of diet modification, cardiovascular and strength training exercises   - had an extensive discussion regarding the adverse health consequences related to untreated sleep disordered breathing - specifically discussed the risks for hypertension, coronary artery disease, cardiac dysrhythmias, cerebrovascular disease, and diabetes - lifestyle modification discussed   - discussed how sleep disruption can increase risk of accidents, particularly when driving - safe driving practices were discussed   Plan  Patient Instructions  Set up for home sleep study  Healthy sleep regimen  Do not drive if sleepy  Follow up in 6 weeks to discuss sleep study results and treatment plan

## 2022-06-06 NOTE — Progress Notes (Signed)
Reviewed and agree with assessment/plan.   Chesley Mires, MD [facility] Pulmonary/Critical Care 06/06/2022, 1:32 PM Pager:  [phone]

## 2022-06-06 NOTE — Patient Instructions (Signed)
Set up for home sleep study  Healthy sleep regimen  Do not drive if sleepy  Follow up in 6 weeks to discuss sleep study results and treatment plan  

## 2022-06-06 NOTE — Progress Notes (Signed)
$'@Patient'C$  ID: Andrea Noble, female    DOB: 1950/11/24, 72 y.o.   MRN: [id number]  Chief Complaint  Patient presents with   sleep consult    Referring provider: Norman Clay, MD  HPI: 72 year old female seen for sleep consult June 06, 2022 for snoring, restless sleep and daytime sleepiness  TEST/EVENTS :   06/06/2022 Sleep consult  Patient presents for a sleep consult today for snoring, restless sleep and daytime sleepiness.  Kindly referred by Dr. Modesta Messing.  Patient complains of restless sleep, daytime sleepiness.  Patient also says her spouse reports very loud snoring.  Patient says her sleep regimen fluctuates.  She says she does play games on her computer device at nighttime.  Sometimes she will spend 2 to 4 hours on this and then go to sleep.  She says she does realize this is an issue with her sleep.  She typically goes to sleep around 10 PM to 2 AM.  Goes to sleep pretty quickly when she decides to go to sleep.  Gets up at 7:30 AM.  Weight is up 10 pounds over the last 2 years current weight is at 148 with a BMI 25.  She has had no previous sleep study before.  She does use melatonin and trazodone to help with insomnia.  Patient does say she has anxiety and depression and is on medications for this as well.  Also has chronic arm pain and is on Neurontin.  She typically drinks 2 cups of coffee in the morning.  And a childlike time throughout the day.  She has had no previous removable dental work.  No history of congestive heart failure or stroke.  She is sometimes active.  Walks once or twice a week.  Epworth score is 2 out of 24.  Typically gets mildly sleepy if she sits down to watch TV.  She does not nap.  No symptoms suspicious for cataplexy or sleep paralysis.  No history of congestive heart failure or stroke Has had deviated septum surgery repair and cleft lip repair.  No history of cleft palate.  Medical history significant for hypertension, SVT, asthma, hypothyroidism, Hodgkin's  lymphoma age 24 status post chemo and radiation.  Social history patient is married.  She is retired Theme park manager.  Has no children.  No smoking.  Social alcohol.  No drug use.  Family history is positive for allergies, asthma, heart disease  Allergies  Allergen Reactions   Levofloxacin     IV Levaquin causes burning   Other Itching    Surgical glue    Immunization History  Administered Date(s) Administered   Fluad Quad(high Dose 65+) 02/04/2022   Influenza,inj,Quad PF,6+ Mos 02/17/2017, 01/04/2018   Influenza-Unspecified 01/18/2018, 01/15/2021   Moderna Sars-Covid-2 Vaccination 05/17/2019, 06/14/2019   PFIZER SARS-COV-2 Pediatric Vaccination 5-41yr 05/17/2019, 06/14/2019, 03/17/2020   Pfizer Covid-19 Vaccine Bivalent Booster 163yr& up 01/21/2021   Pneumococcal Polysaccharide-23 04/30/2018   Td 05/03/2011   Zoster Recombinat (Shingrix) 01/03/2020, 03/13/2020    Past Medical History:  Diagnosis Date   Anxiety    Arthritis    Asthma    Cataract    Depression    Diverticulitis    GERD (gastroesophageal reflux disease)    [place]kin's [place]   underwent radiation and chemo, [place] CT [place] Hyperlipidemia    Hypothyroidism    Menopausal syndrome    OAB (overactive bladder)    Pneumonia    Seasonal allergies    SVT (  supraventricular tachycardia)    Thyroid disease    hypothyroid    Tobacco History: Social History   Tobacco Use  Smoking Status Never   Passive exposure: Past  Smokeless Tobacco Never   Counseling given: Not Answered   Outpatient Medications Prior to Visit  Medication Sig Dispense Refill   acetaminophen (TYLENOL) 650 MG CR tablet Take 1,300 mg by mouth at bedtime.     albuterol (VENTOLIN HFA) 108 (90 Base) MCG/ACT inhaler Inhale 2 puffs into the lungs every 4 (four) hours as needed for wheezing or shortness of breath. 6.7 g 2   azelastine (ASTELIN) 0.1 % nasal spray Place 2 sprays into both nostrils 2 (two)  times daily. Use in each nostril as directed 90 mL 3   buPROPion (WELLBUTRIN SR) 200 MG 12 hr tablet TAKE 1 TABLET TWICE A DAY 180 tablet 3   busPIRone (BUSPAR) 15 MG tablet Take 1 tablet (15 mg total) by mouth 2 (two) times daily. 180 tablet 3   Calcium Carbonate-Vit D-Min (CALTRATE MINIS PLUS MINERALS PO) Take 40 mcg by mouth daily.     Carboxymethylcellulose Sodium (THERATEARS) 0.25 % SOLN Place 1 drop into both eyes daily as needed (Dry eyes).     Collagen-Boron-Hyaluronic Acid (MOVE FREE ULTRA JOINT HEALTH) 40-5-3.3 MG TABS Take 1 tablet by mouth every evening.     CRANBERRY PO Take 4,200 mg by mouth daily at 12 noon.     diclofenac Sodium (VOLTAREN) 1 % GEL Apply 1 application topically 2 (two) times daily.     diltiazem (CARDIZEM CD) 120 MG 24 hr capsule Take 120 mg by mouth daily.     docusate sodium (COLACE) 100 MG capsule Take 100 mg by mouth 2 (two) times daily.     fluocinonide cream (LIDEX) 3.26 % Apply 1 application topically daily as needed (Eczema).     fluticasone-salmeterol (WIXELA INHUB) 100-50 MCG/ACT AEPB USE 1 INHALATION TWICE A DAY 180 each 3   gabapentin (NEURONTIN) 100 MG capsule Take 2 capsules (200 mg total) by mouth 3 (three) times daily. 180 capsule 1   Ivermectin 1 % CREA Apply 1 application topically daily. On face     levothyroxine (SYNTHROID, LEVOTHROID) 50 MCG tablet Take 50-75 mcg by mouth See admin instructions. Take 75 mg every  Monday, Wednesday and Friday Take 50 mg all the other days     liothyronine (CYTOMEL) 5 MCG tablet Take 2.5 mcg by mouth daily.     meclizine (ANTIVERT) 25 MG tablet Take 25 mg by mouth daily as needed (Dizzyness).     Melatonin 10 MG TABS Take 10 mg by mouth at bedtime. Timed-Release     montelukast (SINGULAIR) 10 MG tablet TAKE 1 TABLET AT BEDTIME 100 tablet 3   Multiple Vitamin (MULTIVITAMIN) tablet Take 1 tablet by mouth daily. Vita Fusion adult Gummie     ondansetron (ZOFRAN) 4 MG tablet Take 1 tablet (4 mg total) by mouth every  6 (six) hours. 12 tablet 0   pantoprazole (PROTONIX) 40 MG tablet Take 40 mg by mouth every morning.     Polyethyl Glycol-Propyl Glycol (SYSTANE) 0.4-0.3 % GEL ophthalmic gel Place 1 application into both eyes at bedtime.     polyethylene glycol powder (GLYCOLAX/MIRALAX) 17 GM/SCOOP powder Take 17 g by mouth daily.     pravastatin (PRAVACHOL) 40 MG tablet Take 2 tablets (80 mg total) by mouth daily. 180 tablet 3   PREMPRO 0.45-1.5 MG tablet Take 1 tablet by mouth daily.     Probiotic  Product ([place]) CAPS Take 1 capsule by mouth at bedtime.     saccharomyces boulardii (FLORASTOR) 250 MG capsule Take 500 mg by mouth daily.     sertraline (ZOLOFT) 50 MG tablet Take 25 mg at night for one week, then 50 mg at night 90 tablet 0   sodium chloride (OCEAN) 0.65 % SOLN nasal spray Place 1 spray into both nostrils 2 (two) times daily.     solifenacin (VESICARE) 10 MG tablet Take 10 mg by mouth every other day. 1530     traZODone (DESYREL) 50 MG tablet TAKE 1 TABLET AT BEDTIME 90 tablet 3   No facility-administered medications prior to visit.     Review of Systems:   Constitutional:   No  weight loss, night sweats,  Fevers, chills, fatigue, or  lassitude.  HEENT:   No headaches,  Difficulty swallowing,  Tooth/dental problems, or  Sore throat,                No sneezing, itching, ear ache,  +nasal congestion, post nasal drip,   CV:  No chest pain,  Orthopnea, PND, swelling in lower extremities, anasarca, dizziness, palpitations, syncope.   GI  No heartburn, indigestion, abdominal pain, nausea, vomiting, diarrhea, change in bowel habits, loss of appetite, bloody stools.   Resp: No shortness of breath with exertion or at rest.  No excess mucus, no productive cough,  No non-productive cough,  No coughing up of blood.  No change in color of mucus.  No wheezing.  No chest wall deformity  Skin: no rash or lesions.  GU: no dysuria, change in color of urine, no urgency or frequency.  No  flank pain, no hematuria   MS:  No joint pain or swelling.  No decreased range of motion.  No back pain.    Physical Exam  BP 138/80 (BP Location: Left Arm, Cuff Size: Normal)   Pulse (!) 104   Temp 98 F (36.7 C) (Temporal)   Ht 5' 3.5" (1.613 m)   Wt 148 lb 12.8 oz (67.5 kg)   SpO2 100%   BMI 25.95 kg/m   GEN: A/Ox3; pleasant , NAD, well nourished    HEENT:  [place]/AT,   NOSE-clear, THROAT-clear, no lesions, no postnasal drip or exudate noted.  Class II-III MP airway, upper lip surgical scar  NECK:  Supple w/ fair ROM; no JVD; normal carotid impulses w/o bruits; no thyromegaly or nodules palpated; no lymphadenopathy.    RESP  Clear  P & A; w/o, wheezes/ rales/ or rhonchi. no accessory muscle use, no dullness to percussion  CARD:  RRR, no m/r/g, no peripheral edema, pulses intact, no cyanosis or clubbing.  GI:   Soft & nt; nml bowel sounds; no organomegaly or masses detected.   Musco: Warm bil, no deformities or joint swelling noted.   Neuro: alert, no focal deficits noted.    Skin: Warm, no lesions or rashes    Lab Results:   BNP No results found for: "BNP"  ProBNP No results found for: "PROBNP"  Imaging: No results found.        No data to display          No results found for: "NITRICOXIDE"      Assessment & Plan:   Snoring Snoring, restless sleep, daytime sleepiness all suspicious for underlying sleep apnea.  Patient education on healthy sleep regimen.  Have asked her to cut down her caffeine intake.  No caffeine after noon.  Also to decrease  her screen time.  Will set up for home sleep study to rule out underlying sleep apnea  - discussed how weight can impact sleep and risk for sleep disordered breathing - discussed options to assist with weight loss: combination of diet modification, cardiovascular and strength training exercises   - had an extensive discussion regarding the adverse health consequences related to untreated sleep disordered  breathing - specifically discussed the risks for hypertension, coronary artery disease, cardiac dysrhythmias, cerebrovascular disease, and diabetes - lifestyle modification discussed   - discussed how sleep disruption can increase risk of accidents, particularly when driving - safe driving practices were discussed   Plan  Patient Instructions  Set up for home sleep study  Healthy sleep regimen  Do not drive if sleepy  Follow up in 6 weeks to discuss sleep study results and treatment plan        Rexene Edison, NP 06/06/2022
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
## 2022-06-13 ENCOUNTER — Encounter: Payer: Self-pay | Admitting: Psychiatry

## 2022-06-13 ENCOUNTER — Ambulatory Visit (INDEPENDENT_AMBULATORY_CARE_PROVIDER_SITE_OTHER): Payer: Medicare Other | Admitting: Psychiatry

## 2022-06-13 VITALS — BP 146/71 | HR 106 | Temp 97.6°F | Ht 63.5 in | Wt 148.4 lb

## 2022-06-13 DIAGNOSIS — G4700 Insomnia, unspecified: Secondary | ICD-10-CM

## 2022-06-13 DIAGNOSIS — F331 Major depressive disorder, recurrent, moderate: Secondary | ICD-10-CM

## 2022-06-13 DIAGNOSIS — F4310 Post-traumatic stress disorder, unspecified: Secondary | ICD-10-CM | POA: Diagnosis not present

## 2022-06-13 MED ORDER — FLUOXETINE HCL 20 MG PO CAPS: 20.0000 mg | ORAL_CAPSULE | Freq: Every day | ORAL | 1 refills | Status: DC

## 2022-06-13 NOTE — Patient Instructions (Signed)
Continue bupropion 200 mg twice a day Decrease sertraline 25 mg at night for one week, then discontinue Start fluoxetine 20 mg daily  Continue buspirone 15 mg twice a day Next appointment: 4/11 at 10 Am

## 2022-06-15 ENCOUNTER — Other Ambulatory Visit: Payer: Self-pay | Admitting: Internal Medicine

## 2022-06-27 ENCOUNTER — Other Ambulatory Visit: Payer: Self-pay | Admitting: Psychiatry

## 2022-06-29 ENCOUNTER — Ambulatory Visit
Admission: RE | Admit: 2022-06-29 | Discharge: 2022-06-29 | Disposition: A | Discharge status: Home / Self Care | Payer: Self-pay | Source: Ambulatory Visit | Attending: Neurosurgery | Admitting: Neurosurgery

## 2022-06-29 ENCOUNTER — Other Ambulatory Visit: Payer: Self-pay

## 2022-06-29 DIAGNOSIS — Z049 Encounter for examination and observation for unspecified reason: Secondary | ICD-10-CM

## 2022-07-07 ENCOUNTER — Ambulatory Visit (INDEPENDENT_AMBULATORY_CARE_PROVIDER_SITE_OTHER): Payer: Medicare Other | Admitting: Neurosurgery

## 2022-07-07 VITALS — BP 140/84 | Ht 63.5 in | Wt 145.4 lb

## 2022-07-07 DIAGNOSIS — M5416 Radiculopathy, lumbar region: Secondary | ICD-10-CM

## 2022-07-07 DIAGNOSIS — Z981 Arthrodesis status: Secondary | ICD-10-CM | POA: Diagnosis not present

## 2022-07-07 NOTE — Progress Notes (Signed)
Referring Physician:  Venia Carbon, MD [address] [place],  [place] [address]  Primary Physician:  Venia Carbon, MD  History of Present Illness: 07/07/2022 Andrea Noble is 72 year old with a past medical history of SVT, asthma, hypothyroidism, overactive bladder, Hodgkin's lymphoma, hyperlipidemia, previous lumbar fusion who is here today with a chief complaint of intermittent back pain and pain into her right leg with associated tingling in her right foot.  States that this started about a month ago after a fall when she was reaching to get something from underneath her table.  She has had several episodes of pain that radiates down her right posterior lateral leg and into the bottom of her foot with constant numbness in the bottom of her toes on the right foot.  She recently had evaluation by orthopedics and underwent hip injection with minimal relief.  She saw Dr. Eddie Candle who recommended further evaluation by neurosurgery.   Conservative measures:  Physical therapy: currently doing PT at [facility]  Multimodal medical therapy including regular antiinflammatories: Tylenol, diclofenac gel, gabapentin. Injections: no epidural steroid injections  Past Surgery: L4-S1 fusion with Dr. Lacinda Axon 2021  [name] has no symptoms of cervical myelopathy.  The symptoms are causing a significant impact on the patient's life.   Review of Systems:  A 10 point review of systems is negative, except for the pertinent positives and negatives detailed in the HPI.  Past Medical History: Past Medical History:  Diagnosis Date   Anxiety    Arthritis    Asthma    Cataract    Depression    Diverticulitis    GERD (gastroesophageal reflux disease)    Hodgkin's lymphoma ([place]) 2006   underwent radiation and chemo, last CT scan since moving to [place] from [place]   Hyperlipidemia    Hypothyroidism    Menopausal syndrome    OAB (overactive bladder)    Pneumonia    Seasonal  allergies    SVT (supraventricular tachycardia)    Thyroid disease    hypothyroid    Past Surgical History: Past Surgical History:  Procedure Laterality Date   BLEPHAROPLASTY Bilateral    BREAST BIOPSY     benign   BREAST BIOPSY  07/27/2018   [place] core bx venus COLUMNAR CELL METAPLASIA, AND PSEUDOANGIOMATOUS STROMAL   CARPAL TUNNEL RELEASE Right    CARPAL TUNNEL RELEASE Left    CATARACT EXTRACTION Bilateral 2015   CLEFT LIP REPAIR     COLONOSCOPY WITH PROPOFOL N/A 07/09/2018   Procedure: COLONOSCOPY WITH PROPOFOL;  Surgeon: Lollie Sails, MD;  Location: [facility] ENDOSCOPY;  Service: Endoscopy;  Laterality: N/A;   DE QUERVAIN'S RELEASE Bilateral    DIAGNOSTIC LAPAROSCOPY     endometriosis   ESOPHAGOGASTRODUODENOSCOPY N/A 07/09/2018   Procedure: ESOPHAGOGASTRODUODENOSCOPY (EGD);  Surgeon: Lollie Sails, MD;  Location: [facility] ENDOSCOPY;  Service: Endoscopy;  Laterality: N/A;   FINGER SURGERY Right    Middle finger joint replaced   MAXIMUM ACCESS (MAS) TRANSFORAMINAL LUMBAR INTERBODY FUSION (TLIF) 2 LEVEL Right 07/06/2020   Procedure: L4/5, L5/S1 TRANSFORAMINAL LUMBAR INTERBODY FUSION (TLIF), L4-S1 PEDICLE SCREWS,DECOMPRESSION ;  Surgeon: Deetta Perla, MD;  Location: [facility] ORS;  Service: Neurosurgery;  Laterality: Right;   THUMB SURGERY Right    took tendon from forearm and wrapped it around the thumb joint    Allergies: Allergies as of 07/07/2022 - Review Complete 07/07/2022  Allergen Reaction Noted   Levofloxacin  01/30/2017   Other Itching     Medications: Outpatient  Encounter Medications as of 07/07/2022  Medication Sig   acetaminophen (TYLENOL) 650 MG CR tablet Take 1,300 mg by mouth at bedtime.   albuterol (VENTOLIN HFA) 108 (90 Base) MCG/ACT inhaler Inhale 2 puffs into the lungs every 4 (four) hours as needed for wheezing or shortness of breath.   buPROPion (WELLBUTRIN SR) 200 MG 12 hr tablet TAKE 1 TABLET TWICE A DAY   busPIRone (BUSPAR) 15 MG tablet Take 1 tablet (15 mg  total) by mouth 2 (two) times daily.   Calcium Carbonate-Vit D-Min (CALTRATE MINIS PLUS MINERALS PO) Take 40 mcg by mouth daily.   Carboxymethylcellulose Sodium (THERATEARS) 0.25 % SOLN Place 1 drop into both eyes daily as needed (Dry eyes).   CRANBERRY PO Take 4,200 mg by mouth daily at 12 noon.   diclofenac Sodium (VOLTAREN) 1 % GEL Apply 1 application topically 2 (two) times daily.   diltiazem (CARDIZEM CD) 120 MG 24 hr capsule Take 120 mg by mouth daily.   docusate sodium (COLACE) 100 MG capsule Take 100 mg by mouth 2 (two) times daily.   fluocinonide cream (LIDEX) AB-123456789 % Apply 1 application topically daily as needed (Eczema).   FLUoxetine (PROZAC) 20 MG capsule Take 1 capsule (20 mg total) by mouth daily.   fluticasone-salmeterol (WIXELA INHUB) 100-50 MCG/ACT AEPB USE 1 INHALATION TWICE A DAY   gabapentin (NEURONTIN) 100 MG capsule Take 2 capsules (200 mg total) by mouth 3 (three) times daily.   levothyroxine (SYNTHROID, LEVOTHROID) 50 MCG tablet Take 50-75 mcg by mouth See admin instructions. Take 75 mg every  Monday, Wednesday and Friday Take 50 mg all the other days   liothyronine (CYTOMEL) 5 MCG tablet Take 2.5 mcg by mouth daily.   meclizine (ANTIVERT) 25 MG tablet Take 25 mg by mouth daily as needed (Dizzyness).   Melatonin 10 MG TABS Take 10 mg by mouth at bedtime. Timed-Release   montelukast (SINGULAIR) 10 MG tablet TAKE 1 TABLET AT BEDTIME   Multiple Vitamin (MULTIVITAMIN) tablet Take 1 tablet by mouth daily. Vita Fusion adult Gummie   pantoprazole (PROTONIX) 40 MG tablet Take 40 mg by mouth every morning.   Polyethyl Glycol-Propyl Glycol (SYSTANE) 0.4-0.3 % GEL ophthalmic gel Place 1 application into both eyes at bedtime.   polyethylene glycol powder (GLYCOLAX/MIRALAX) 17 GM/SCOOP powder Take 17 g by mouth daily.   pravastatin (PRAVACHOL) 40 MG tablet TAKE 2 TABLETS(80 MG) BY MOUTH DAILY   PREMPRO 0.45-1.5 MG tablet Take 1 tablet by mouth daily.   Probiotic Product ([place]) CAPS Take 1 capsule by mouth at bedtime.   sodium chloride (OCEAN) 0.65 % SOLN nasal spray Place 1 spray into both nostrils 2 (two) times daily.   solifenacin (VESICARE) 10 MG tablet Take 10 mg by mouth every other day. 1530   traZODone (DESYREL) 50 MG tablet TAKE 1 TABLET AT BEDTIME   [DISCONTINUED] azelastine (ASTELIN) 0.1 % nasal spray Place 2 sprays into both nostrils 2 (two) times daily. Use in each nostril as directed (Patient not taking: Reported on 07/07/2022)   [DISCONTINUED] Collagen-Boron-Hyaluronic Acid (MOVE FREE ULTRA JOINT HEALTH) 40-5-3.3 MG TABS Take 1 tablet by mouth every evening. (Patient not taking: Reported on 07/07/2022)   [DISCONTINUED] Ivermectin 1 % CREA Apply 1 application topically daily. On face (Patient not taking: Reported on 07/07/2022)   [DISCONTINUED] ondansetron (ZOFRAN) 4 MG tablet Take 1 tablet (4 mg total) by mouth every 6 (six) hours. (Patient not taking: Reported on 07/07/2022)   [DISCONTINUED] saccharomyces boulardii (FLORASTOR) 250 MG  capsule Take 500 mg by mouth daily. (Patient not taking: Reported on 07/07/2022)   No facility-administered encounter medications on file as of 07/07/2022.    Social History: Social History   Tobacco Use   Smoking status: Never    Passive exposure: Past   Smokeless tobacco: Never  Vaping Use   Vaping Use: Never used  Substance Use Topics   Alcohol use: Yes    Comment: very occasional once a week   Drug use: No    Family Medical History: Family History  Problem Relation Age of Onset   Breast cancer Maternal 42    Anuerysm Mother    Stroke Mother    Heart attack Father    Alzheimer's disease Father    Developmental delay Brother     Physical Examination: Today's Vitals   07/07/22 1200  BP: (!) 140/84  Weight: 66 kg  Height: 5' 3.5" (1.613 m)  PainSc: 2   PainLoc: Back   Body mass index is 25.35 kg/m.    General: Patient is well developed, well nourished, calm, collected, and in no apparent  distress. Attention to examination is appropriate.  Psychiatric: Patient is non-anxious.  Head:  Pupils equal, round, and reactive to light.  ENT:  Oral mucosa appears well hydrated.  Neck:   Supple.    Respiratory: Patient is breathing without any difficulty.  Extremities: No edema.  Vascular: Palpable dorsal pedal pulses.  Skin:   On exposed skin, there are no abnormal skin lesions.  NEUROLOGICAL:     Awake, alert, oriented to person, place, and time.  Speech is clear and fluent. Fund of knowledge is appropriate.   Cranial Nerves: Pupils equal round and reactive to light.  Facial tone is symmetric.  Facial sensation is symmetric.  ROM of spine: full.  Palpation of spine: non tender.    Strength: Side Biceps Triceps Deltoid Interossei Grip Wrist Ext. Wrist Flex.  R '5 5 5 5 5 5 5  '$ L '5 5 5 5 5 5 5   '$ Side Iliopsoas Quads Hamstring PF DF EHL  R '5 5 5 5 5 5  '$ L '5 5 5 5 5 5   '$ Reflexes are 1+ and symmetric at the biceps, triceps, brachioradialis, patella and achilles.   Hoffman's is absent.  Clonus is not present.  Toes are down-going.  Bilateral upper and lower extremity sensation is intact to light touch except numbness in the foot of the right foot.    Gait is normal.     Medical Decision Making  Imaging: 06/22/22 lumbar xrays Show multilevel degenerative changes with associated adjacent level disease.  There is no evidence of hardware malfunction.  I have personally reviewed the images and agree with the above interpretation.  Assessment and Plan: Ms. Klimas is a pleasant 72 y.o. female with a longstanding history of lumbar sacral disease which responded well to surgery with Dr. Lacinda Axon in 2021.  She has had a recurrence of right radiating leg pain after a fall about a month ago.  She states that she is overall tolerating her symptoms relatively well at this time however the persistent numbness in her right foot is bothersome.  She would like to move forward with obtaining  an MRI of her lumbar spine for further evaluation.  We discussed the possibility of considering injections.  Contact her via telephone visit after completion of her MRI for review and discussion of further plan of care.   Thank you for involving me in the care of this  patient.   I spent a total of 39 minutes in both face-to-face and non-face-to-face activities for this visit on the date of this encounter including you have outside records, review of x-rays, review of symptoms, discussion of evaluation options, physical exam, and documentation.   [facility]

## 2022-07-20 ENCOUNTER — Ambulatory Visit: Payer: Medicare Other

## 2022-07-20 DIAGNOSIS — G4733 Obstructive sleep apnea (adult) (pediatric): Secondary | ICD-10-CM

## 2022-07-20 DIAGNOSIS — R0683 Snoring: Secondary | ICD-10-CM

## 2022-07-20 DIAGNOSIS — R400 Somnolence: Secondary | ICD-10-CM

## 2022-07-21 ENCOUNTER — Ambulatory Visit: Payer: Medicare Other | Admitting: Adult Health

## 2022-07-22 ENCOUNTER — Ambulatory Visit
Admission: RE | Admit: 2022-07-22 | Discharge: 2022-07-22 | Disposition: A | Discharge status: Home / Self Care | Payer: Medicare Other | Source: Ambulatory Visit | Attending: Neurosurgery | Admitting: Neurosurgery

## 2022-07-22 DIAGNOSIS — Z981 Arthrodesis status: Secondary | ICD-10-CM | POA: Insufficient documentation

## 2022-07-22 DIAGNOSIS — M5416 Radiculopathy, lumbar region: Secondary | ICD-10-CM | POA: Insufficient documentation

## 2022-07-29 ENCOUNTER — Ambulatory Visit (INDEPENDENT_AMBULATORY_CARE_PROVIDER_SITE_OTHER): Payer: Medicare Other | Admitting: Neurosurgery

## 2022-07-29 DIAGNOSIS — M5416 Radiculopathy, lumbar region: Secondary | ICD-10-CM | POA: Diagnosis not present

## 2022-07-29 NOTE — Progress Notes (Signed)
Neurosurgery Telephone (Audio-Only) Note  Requesting Provider     Venia Carbon, MD [address] [place],  [place] [address] T: [phone] F: [phone]  Primary Care Provider Venia Carbon, MD [place] [address] T: [phone] F: [phone]  Telehealth visit was conducted with Andrea Noble, a 72 y.o. female via telephone.  History of Present Illness: Andrea Noble is a 72 y.o presenting telephone visit to review her MRI.  She does continue to have low back pain.  She states that this is largely manageable.  The radiating pain down her right leg has since resolved.  She does occasionally have numbness in the bottom of her right foot but again this is largely manageable.  She has continued with physical therapy.  07/07/2022 Ms. Andrea Noble is 72 year old with a past medical history of SVT, asthma, hypothyroidism, overactive bladder, Hodgkin's lymphoma, hyperlipidemia, previous lumbar fusion who is here today with a chief complaint of intermittent back pain and pain into her right leg with associated tingling in her right foot.  States that this started about a month ago after a fall when she was reaching to get something from underneath her table.  She has had several episodes of pain that radiates down her right posterior lateral leg and into the bottom of her foot with constant numbness in the bottom of her toes on the right foot.  She recently had evaluation by orthopedics and underwent hip injection with minimal relief.  She saw Dr. Eddie Candle who recommended further evaluation by neurosurgery.    Conservative measures:  Physical therapy: currently doing PT at [facility]  Multimodal medical therapy including regular antiinflammatories: Tylenol, diclofenac gel, gabapentin. Injections: no epidural steroid injections   Past Surgery: L4-S1 fusion with Dr. Lacinda Axon 2021   Andrea Noble has no symptoms of cervical myelopathy.   The symptoms  are causing a significant impact on the patient's life.   General Review of Systems:  A ROS was performed including pertinent positive and negatives as documented.  All other systems are negative.  Prior to Admission medications   Medication Sig Start Date End Date Taking? Authorizing Provider  acetaminophen (TYLENOL) 650 MG CR tablet Take 1,300 mg by mouth at bedtime.    [provider]  albuterol (VENTOLIN HFA) 108 (90 Base) MCG/ACT inhaler Inhale 2 puffs into the lungs every 4 (four) hours as needed for wheezing or shortness of breath. 02/08/22   Venia Carbon, MD  buPROPion (WELLBUTRIN SR) 200 MG 12 hr tablet TAKE 1 TABLET TWICE A DAY 05/13/22   Venia Carbon, MD  busPIRone (BUSPAR) 15 MG tablet Take 1 tablet (15 mg total) by mouth 2 (two) times daily. 12/28/21   Venia Carbon, MD  Calcium Carbonate-Vit D-Min (CALTRATE MINIS PLUS MINERALS PO) Take 40 mcg by mouth daily.    [provider]  Carboxymethylcellulose Sodium (THERATEARS) 0.25 % SOLN Place 1 drop into both eyes daily as needed (Dry eyes).    [provider]  CRANBERRY PO Take 4,200 mg by mouth daily at 12 noon.    [provider]  diclofenac Sodium (VOLTAREN) 1 % GEL Apply 1 application topically 2 (two) times daily. 03/04/20   [provider]  diltiazem (CARDIZEM CD) 120 MG 24 hr capsule Take 120 mg by mouth daily. 05/14/21   [provider]  docusate sodium (COLACE) 100 MG capsule Take 100 mg by mouth 2 (two) times daily.    [provider]  fluocinonide cream (LIDEX) AB-123456789 % Apply 1 application topically daily as needed (Eczema).    [provider]  FLUoxetine (PROZAC) 20 MG capsule Take 1 capsule (20 mg total) by mouth daily. 06/13/22 08/12/22  Norman Clay, MD  fluticasone-salmeterol [facility] INHUB) 100-50 MCG/ACT AEPB USE 1 INHALATION TWICE A DAY 02/04/22   Venia Carbon, MD  gabapentin (NEURONTIN) 100 MG capsule Take 2 capsules (200 mg total) by  mouth 3 (three) times daily. 03/31/22   Venia Carbon, MD  levothyroxine (SYNTHROID, LEVOTHROID) 50 MCG tablet Take 50-75 mcg by mouth See admin instructions. Take 75 mg every  Monday, Wednesday and Friday Take 50 mg all the other days    [provider]  liothyronine (CYTOMEL) 5 MCG tablet Take 2.5 mcg by mouth daily. 04/24/20   [provider]  meclizine (ANTIVERT) 25 MG tablet Take 25 mg by mouth daily as needed (Dizzyness). 07/16/05   [provider]  Melatonin 10 MG TABS Take 10 mg by mouth at bedtime. Timed-Release    [provider]  montelukast (SINGULAIR) 10 MG tablet TAKE 1 TABLET AT BEDTIME 05/03/22   Venia Carbon, MD  Multiple Vitamin (MULTIVITAMIN) tablet Take 1 tablet by mouth daily. Vita Fusion adult Gummie    [provider]  pantoprazole (PROTONIX) 40 MG tablet Take 40 mg by mouth every morning.    [provider]  Polyethyl Glycol-Propyl Glycol (SYSTANE) 0.4-0.3 % GEL ophthalmic gel Place 1 application into both eyes at bedtime.    [provider]  polyethylene glycol powder (GLYCOLAX/MIRALAX) 17 GM/SCOOP powder Take 17 g by mouth daily.    [provider]  pravastatin (PRAVACHOL) 40 MG tablet TAKE 2 TABLETS(80 MG) BY MOUTH DAILY 06/15/22   Venia Carbon, MD  PREMPRO 0.45-1.5 MG tablet Take 1 tablet by mouth daily. 12/24/16   [provider]  Probiotic Product ([place]) CAPS Take 1 capsule by mouth at bedtime.    [provider]  sodium chloride (OCEAN) 0.65 % SOLN nasal spray Place 1 spray into both nostrils 2 (two) times daily.    [provider]  solifenacin (VESICARE) 10 MG tablet Take 10 mg by mouth every other day. 1530 04/17/20   [provider]  traZODone (DESYREL) 50 MG tablet TAKE 1 TABLET AT BEDTIME 03/31/22   Venia Carbon, MD    DATA REVIEWED    Imaging Studies  07/22/22 MRI spine FINDINGS: Segmentation:  Standard.   Alignment:  Mild anterolisthesis at L5-S1 and retrolisthesis at L3-4.   Vertebrae: L4-5 PLIF without complicating feature. No fracture or aggressive bone lesion.   Conus medullaris and cauda equina: Conus extends to the T12-L1 level. Conus and cauda equina appear normal.   Paraspinal and other soft tissues: Negative for perispinal mass or inflammation. Expected postoperative scarring in the posterior soft tissues.   Disc levels:   T12- L1: Unremarkable.   L1-L2: Disc narrowing and bulging. Endplate and facet spurring on both sides.   L2-L3: Disc narrowing with endplate and facet spurring. Left paracentral protrusion with mild left subarticular recess stenosis, stable. Mild left foraminal narrowing   L3-L4: Disc collapse with endplate and facet spurring. Mild right foraminal and spinal stenosis   L4-L5: Interval PLIF.  Patent canal and foramina.   L5-S1:Degenerative facet spurring with mild anterolisthesis. Disc height loss and mild bulging. No significant change.   IMPRESSION: 1. Uncomplicated appearance of interval L4-5 PLIF. Resolved spinal stenosis at this level. 2. Degeneration elsewhere in the lumbar  spine is similar to 2021 and noncompressive, as above.     Electronically Signed   By: Jorje Guild M.D.   On: 07/26/2022 05:47  IMPRESSION  Ms. Ozimek is a 72 y.o. female who I performed a telephone encounter today for evaluation and management of: Back and leg pain  PLAN  [name] is a very pleasant 72 year old presenting with low back and right radiating leg pain.  Her MRI is largely normal however she does have some mild adjacent level disease at L3-4.  We discussed that this may explain some of her back pain.  I do not see any compressive pathology that explains her leg pain.  Given that she is largely tolerating things at this time, we will defer further treatment for now.  Should her symptoms recur and not be manageable, would like for her to consider facet  injection as well as potentially undergoing an EMG to find any other explanatory cause for her leg pain.  I will otherwise see her going forward on an as-needed basis.  She expressed understanding was in agreement with this plan.  No orders of the defined types were placed in this encounter.  DISPOSITION  Follow up: In person appointment in  PRN  Loleta Dicker, PA   TELEPHONE DOCUMENTATION   This visit was performed via telephone.  Patient location: home Provider location: office  I spent a total of 6 minutes non-face-to-face activities for this visit on the date of this encounter including review of current clinical condition and response to treatment.  The patient is aware of and accepts the limits of this telehealth visit.

## 2022-08-08 ENCOUNTER — Other Ambulatory Visit: Payer: Self-pay | Admitting: Psychiatry
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
## 2022-08-11 ENCOUNTER — Encounter: Payer: Self-pay | Admitting: Psychiatry

## 2022-08-11 ENCOUNTER — Other Ambulatory Visit
Admission: RE | Admit: 2022-08-11 | Discharge: 2022-08-11 | Disposition: A | Discharge status: Home / Self Care | Payer: Medicare Other | Source: Ambulatory Visit | Attending: Psychiatry | Admitting: Psychiatry

## 2022-08-11 ENCOUNTER — Telehealth (INDEPENDENT_AMBULATORY_CARE_PROVIDER_SITE_OTHER): Payer: Medicare Other | Admitting: Psychiatry

## 2022-08-11 DIAGNOSIS — R5383 Other fatigue: Secondary | ICD-10-CM

## 2022-08-11 DIAGNOSIS — F4310 Post-traumatic stress disorder, unspecified: Secondary | ICD-10-CM | POA: Diagnosis not present

## 2022-08-11 DIAGNOSIS — E611 Iron deficiency: Secondary | ICD-10-CM | POA: Diagnosis present

## 2022-08-11 DIAGNOSIS — G4700 Insomnia, unspecified: Secondary | ICD-10-CM | POA: Diagnosis not present

## 2022-08-11 DIAGNOSIS — F331 Major depressive disorder, recurrent, moderate: Secondary | ICD-10-CM

## 2022-08-11 LAB — COMPREHENSIVE METABOLIC PANEL
ALT: 24 U/L (ref 0–44)
AST: 31 U/L (ref 15–41)
Albumin: 4 g/dL (ref 3.5–5.0)
Alkaline Phosphatase: 48 U/L (ref 38–126)
Anion gap: 9 (ref 5–15)
BUN: 9 mg/dL (ref 8–23)
CO2: 21 mmol/L — ABNORMAL LOW (ref 22–32)
Calcium: 8.5 mg/dL — ABNORMAL LOW (ref 8.9–10.3)
Chloride: 99 mmol/L (ref 98–111)
Creatinine, Ser: 0.74 mg/dL (ref 0.44–1.00)
GFR, Estimated: >60 mL/min (ref >60)
Glucose, Bld: 101 mg/dL — ABNORMAL HIGH (ref 70–99)
Potassium: 3.4 mmol/L — ABNORMAL LOW (ref 3.5–5.1)
Sodium: 129 mmol/L — ABNORMAL LOW (ref 135–145)
Total Bilirubin: 0.6 mg/dL (ref 0.3–1.2)
Total Protein: 7.4 g/dL (ref 6.5–8.1)

## 2022-08-11 LAB — CBC
HCT: 37.5 % (ref 36.0–46.0)
Hemoglobin: 12.5 g/dL (ref 12.0–15.0)
MCH: 30.6 pg (ref 26.0–34.0)
MCHC: 33.3 g/dL (ref 30.0–36.0)
MCV: 91.7 fL (ref 80.0–100.0)
Platelets: 191 10*3/uL (ref 150–400)
RBC: 4.09 MIL/uL (ref 3.87–5.11)
RDW: 14 % (ref 11.5–15.5)
WBC: 6.8 10*3/uL (ref 4.0–10.5)
nRBC: 0 % (ref 0.0–0.2)

## 2022-08-11 LAB — T4, FREE: Free T4: 0.76 ng/dL (ref 0.61–1.12)

## 2022-08-11 LAB — TSH: TSH: 1.417 u[IU]/mL (ref 0.350–4.500)

## 2022-08-11 LAB — FERRITIN: Ferritin: 17 ng/mL (ref 11–307)

## 2022-08-11 MED ORDER — FLUOXETINE HCL 20 MG PO CAPS: 20.0000 mg | ORAL_CAPSULE | Freq: Every day | ORAL | 1 refills | Status: DC

## 2022-08-11 NOTE — Progress Notes (Signed)
Please reach out to her ad inform that the test results reveal low sodium and low ferritin levels, both of which can lead to fatigue. Please advise her to schedule a follow-up appointment with her primary care provider for further evaluation

## 2022-08-11 NOTE — Patient Instructions (Signed)
Continue bupropion 200 mg twice a day Continue fluoxetine 20 mg daily  Continue buspirone 15 mg twice a day Next appointment: 6/10 at 1 30

## 2022-08-12 ENCOUNTER — Telehealth: Payer: Self-pay | Admitting: Adult Health

## 2022-08-12 NOTE — Telephone Encounter (Signed)
Can you please advise on patients sleep study results

## 2022-08-12 NOTE — Telephone Encounter (Signed)
I do not see any sleep study results , has it been done ?

## 2022-08-12 NOTE — Telephone Encounter (Signed)
PCC's can you please advise on sleep study? Looks like it was completed

## 2022-08-12 NOTE — Progress Notes (Signed)
Spoke to patient about lab results she voiced understanding and stated that she would contact her PCP for further evaluation

## 2022-08-12 NOTE — Telephone Encounter (Signed)
Pt. Calling back to go over HST results has not gotten a call and has been a month

## 2022-08-12 NOTE — Telephone Encounter (Signed)
Synetta Fail did this HST I will forward to her

## 2022-08-15 ENCOUNTER — Ambulatory Visit (INDEPENDENT_AMBULATORY_CARE_PROVIDER_SITE_OTHER): Payer: Medicare Other | Admitting: Internal Medicine

## 2022-08-15 ENCOUNTER — Encounter: Payer: Self-pay | Admitting: Internal Medicine

## 2022-08-15 ENCOUNTER — Ambulatory Visit (INDEPENDENT_AMBULATORY_CARE_PROVIDER_SITE_OTHER)
Admission: RE | Admit: 2022-08-15 | Discharge: 2022-08-15 | Disposition: A | Discharge status: Home / Self Care | Payer: Medicare Other | Source: Ambulatory Visit | Attending: Internal Medicine | Admitting: Internal Medicine

## 2022-08-15 VITALS — BP 138/70 | HR 93 | Temp 97.4°F | Ht 63.5 in | Wt 146.0 lb

## 2022-08-15 DIAGNOSIS — E871 Hypo-osmolality and hyponatremia: Secondary | ICD-10-CM | POA: Diagnosis not present

## 2022-08-15 DIAGNOSIS — J22 Unspecified acute lower respiratory infection: Secondary | ICD-10-CM | POA: Diagnosis not present

## 2022-08-15 DIAGNOSIS — F39 Unspecified mood [affective] disorder: Secondary | ICD-10-CM

## 2022-08-15 DIAGNOSIS — R059 Cough, unspecified: Secondary | ICD-10-CM

## 2022-08-15 DIAGNOSIS — J4520 Mild intermittent asthma, uncomplicated: Secondary | ICD-10-CM

## 2022-08-15 MED ORDER — AMOXICILLIN-POT CLAVULANATE 875-125 MG PO TABS: 1.0000 | ORAL_TABLET | Freq: Two times a day (BID) | ORAL | 0 refills | Status: DC

## 2022-08-15 MED ORDER — AZITHROMYCIN 250 MG PO TABS: ORAL_TABLET | ORAL | 0 refills | Status: DC

## 2022-08-15 NOTE — Assessment & Plan Note (Signed)
Persistent fatigue could be from depression, meds, illness Now on fluxoetine Keeping up with Dr Vanetta Shawl

## 2022-08-15 NOTE — Assessment & Plan Note (Signed)
CXR shows areas of atelectasis in both lungs Will give z-pak for this Also augmentin due to tooth and possible sinus involvement Recheck 1 month to be sure symptoms improving

## 2022-08-15 NOTE — Assessment & Plan Note (Signed)
Mild and fairly consistent over the past 4 years She drinks a lot of water---asked her to cut back some (and doesn't seem to need pedialyte or gatorade either) Not on HCTZ or BP meds

## 2022-08-15 NOTE — Assessment & Plan Note (Signed)
Doesn't seem to be exacerbated to any significant degree Continue the advair and prn albuterol

## 2022-08-15 NOTE — Progress Notes (Signed)
Subjective:    Patient ID: Andrea Noble, female    DOB: [birth year], 72 y.o.   MRN: [id number]  HPI Here due to cough and fatigue--and low sodium  Now seeing Dr Vanetta Shawl Was started on sertraline--made her feel tired Changed to fluoxetine---but still fatigued  Now the fatigue has worsened Has fuzzy head Cough started with the pollen but worsened over the past 5 days No real fever---highest 99 No chills or sweats Some SOB---especially going up a flight of steps  Had sore throat before the cough Has bad tooth--needs it pulled and will get implant Some pain on right side of face No ear pain  Current Outpatient Medications on File Prior to Visit  Medication Sig Dispense Refill   acetaminophen (TYLENOL) 650 MG CR tablet Take 1,300 mg by mouth at bedtime.     albuterol (VENTOLIN HFA) 108 (90 Base) MCG/ACT inhaler Inhale 2 puffs into the lungs every 4 (four) hours as needed for wheezing or shortness of breath. 6.7 g 2   buPROPion (WELLBUTRIN SR) 200 MG 12 hr tablet TAKE 1 TABLET TWICE A DAY 180 tablet 3   busPIRone (BUSPAR) 15 MG tablet Take 1 tablet (15 mg total) by mouth 2 (two) times daily. 180 tablet 3   Calcium Carbonate-Vit D-Min (CALTRATE MINIS PLUS MINERALS PO) Take 40 mcg by mouth daily.     Carboxymethylcellulose Sodium (THERATEARS) 0.25 % SOLN Place 1 drop into both eyes daily as needed (Dry eyes).     CRANBERRY PO Take 4,200 mg by mouth daily at 12 noon.     diclofenac Sodium (VOLTAREN) 1 % GEL Apply 1 application topically 2 (two) times daily.     diltiazem (CARDIZEM CD) 120 MG 24 hr capsule Take 120 mg by mouth daily.     docusate sodium (COLACE) 100 MG capsule Take 100 mg by mouth 2 (two) times daily.     fluocinonide cream (LIDEX) 0.05 % Apply 1 application topically daily as needed (Eczema).     FLUoxetine (PROZAC) 20 MG capsule Take 1 capsule (20 mg total) by mouth daily. 30 capsule 1   fluticasone-salmeterol (WIXELA INHUB) 100-50 MCG/ACT AEPB USE 1 INHALATION TWICE A  DAY 180 each 3   gabapentin (NEURONTIN) 100 MG capsule Take 2 capsules (200 mg total) by mouth 3 (three) times daily. 180 capsule 1   levothyroxine (SYNTHROID, LEVOTHROID) 50 MCG tablet Take 50-75 mcg by mouth See admin instructions. Take 75 mg every  Monday, Wednesday and Friday Take 50 mg all the other days     liothyronine (CYTOMEL) 5 MCG tablet Take 2.5 mcg by mouth daily.     Melatonin 10 MG TABS Take 10 mg by mouth at bedtime. Timed-Release     montelukast (SINGULAIR) 10 MG tablet TAKE 1 TABLET AT BEDTIME 100 tablet 3   Multiple Vitamin (MULTIVITAMIN) tablet Take 1 tablet by mouth daily. Vita Fusion adult Gummie     pantoprazole (PROTONIX) 40 MG tablet Take 40 mg by mouth every morning.     Polyethyl Glycol-Propyl Glycol (SYSTANE) 0.4-0.3 % GEL ophthalmic gel Place 1 application into both eyes at bedtime.     polyethylene glycol powder (GLYCOLAX/MIRALAX) 17 GM/SCOOP powder Take 17 g by mouth daily.     pravastatin (PRAVACHOL) 40 MG tablet TAKE 2 TABLETS(80 MG) BY MOUTH DAILY 180 tablet 3   PREMPRO 0.45-1.5 MG tablet Take 1 tablet by mouth daily.     Probiotic Product (PHILLIPS COLON HEALTH) CAPS Take 1 capsule by mouth at bedtime.  sodium chloride (OCEAN) 0.65 % SOLN nasal spray Place 1 spray into both nostrils 2 (two) times daily.     solifenacin (VESICARE) 10 MG tablet Take 10 mg by mouth every other day. 1530     traZODone (DESYREL) 50 MG tablet TAKE 1 TABLET AT BEDTIME 90 tablet 3   meclizine (ANTIVERT) 25 MG tablet Take 25 mg by mouth daily as needed (Dizzyness). (Patient not taking: Reported on 08/15/2022)     No current facility-administered medications on file prior to visit.    Allergies  Allergen Reactions   Levofloxacin     IV Levaquin causes burning   Other Itching    Surgical glue    Past Medical History:  Diagnosis Date   Anxiety    Arthritis    Asthma    Cataract    Depression    Diverticulitis    GERD (gastroesophageal reflux disease)    Hodgkin's  lymphoma 2006   underwent radiation and chemo, last CT scan since moving to [place] from [place] 2018   Hyperlipidemia    Hypothyroidism    Menopausal syndrome    OAB (overactive bladder)    Pneumonia    Seasonal allergies    SVT (supraventricular tachycardia)    Thyroid disease    hypothyroid    Past Surgical History:  Procedure Laterality Date   BLEPHAROPLASTY Bilateral    BREAST BIOPSY     benign   BREAST BIOPSY  07/27/2018   [place] core bx venus COLUMNAR CELL METAPLASIA, AND PSEUDOANGIOMATOUS STROMAL   CARPAL TUNNEL RELEASE Right    CARPAL TUNNEL RELEASE Left    CATARACT EXTRACTION Bilateral 2015   CLEFT LIP REPAIR     COLONOSCOPY WITH PROPOFOL N/A 07/09/2018   Procedure: COLONOSCOPY WITH PROPOFOL;  Surgeon: Christena Deem, MD;  Location: [facility] ENDOSCOPY;  Service: Endoscopy;  Laterality: N/A;   DE QUERVAIN'S RELEASE Bilateral    DIAGNOSTIC LAPAROSCOPY     endometriosis   ESOPHAGOGASTRODUODENOSCOPY N/A 07/09/2018   Procedure: ESOPHAGOGASTRODUODENOSCOPY (EGD);  Surgeon: Christena Deem, MD;  Location: [facility] ENDOSCOPY;  Service: Endoscopy;  Laterality: N/A;   FINGER SURGERY Right    Middle finger joint replaced   MAXIMUM ACCESS (MAS) TRANSFORAMINAL LUMBAR INTERBODY FUSION (TLIF) 2 LEVEL Right 07/06/2020   Procedure: L4/5, L5/S1 TRANSFORAMINAL LUMBAR INTERBODY FUSION (TLIF), L4-S1 PEDICLE SCREWS,DECOMPRESSION ;  Surgeon: Lucy Chris, MD;  Location: [facility] ORS;  Service: Neurosurgery;  Laterality: Right;   THUMB SURGERY Right    took tendon from forearm and wrapped it around the thumb joint    Family History  Problem Relation Age of Onset   Breast cancer Maternal Aunt    Anuerysm Mother    Stroke Mother    Heart attack Father    Alzheimer's disease Father    Developmental delay Brother     Social History   Socioeconomic History   Marital status: Married    Spouse name: Financial planner   Number of children: 0   Years of education: Not on file   Highest education level: Master's degree  (e.g., MA, MS, MEng, MEd, MSW, MBA)  Occupational History   Occupation: Social work/chaplain    Comment: 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 [place]  Tobacco Use   Smoking status: Never    Passive exposure: Past   Smokeless tobacco: Never  Vaping Use   Vaping Use: Never used  Substance and Sexual Activity   Alcohol use: Yes    Comment: very occasional once a week   Drug use: No   Sexual activity: Not  Currently  Other Topics Concern   Not on file  Social History Narrative   Has living will   Husband is health care POA-- sister Margaret [place] is alternate   Would accept resuscitation attempts   No feeding tube if cognitively unaware   Social Determinants of Health   Financial Resource Strain: Not on file  Food Insecurity: Not on file  Transportation Needs: Not on file  Physical Activity: Not on file  Stress: Not on file  Social Connections: Not on file  Intimate Partner Violence: Not on file   Review of Systems Eating okay Drinks a lot of water---at least 64 ounces a day Also drinking pedialyte and gatorade     Objective:   Physical Exam Constitutional:      Appearance: Normal appearance.  HENT:     Head:     Comments: Some tenderness along right maxilla and mandible    Right Ear: Tympanic membrane and ear canal normal.     Left Ear: Tympanic membrane and ear canal normal.     Mouth/Throat:     Pharynx: No oropharyngeal exudate or posterior oropharyngeal erythema.  Pulmonary:     Effort: Pulmonary effort is normal.     Breath sounds: No rales.     Comments: Slight wheeze at LLL---but not tight  Musculoskeletal:     Cervical back: Neck supple.  Lymphadenopathy:     Cervical: No cervical adenopathy.  Neurological:     Mental Status: She is alert.            Assessment & Plan:

## 2022-08-15 NOTE — Assessment & Plan Note (Signed)
Going on for a while Will check CXR

## 2022-08-16 ENCOUNTER — Telehealth: Payer: Self-pay

## 2022-08-16 DIAGNOSIS — G4733 Obstructive sleep apnea (adult) (pediatric): Secondary | ICD-10-CM

## 2022-08-16 NOTE — Telephone Encounter (Signed)
-----   Message from [name], MD sent at 08/11/2022  4:43 PM EDT ----- Please reach out to her ad inform that the test results reveal low sodium and low ferritin levels, both of which can lead to fatigue. Please advise her to schedule a follow-up appointment with her primary care provider for further evaluation

## 2022-08-16 NOTE — Telephone Encounter (Signed)
pt was notified of labwork results.  

## 2022-08-16 NOTE — Telephone Encounter (Signed)
This is one of the sleep study that was done in March and I sent a message to Dr. Craige Cotta to review.  I have sent another message to Dr. Craige Cotta again today for him to read because it hasn't been yet

## 2022-08-16 NOTE — Telephone Encounter (Signed)
Andrea Noble - what is the status of this?

## 2022-08-16 NOTE — Telephone Encounter (Signed)
I have sent a high priority staff message to Dr. Craige Cotta to read this study.

## 2022-08-17 NOTE — Telephone Encounter (Signed)
Will await Tammy's response/recommendations.

## 2022-08-17 NOTE — Telephone Encounter (Signed)
HST report has been scanned into Epic

## 2022-08-18 NOTE — Telephone Encounter (Signed)
Spoke to patient and relayed below message/recommendations. Offered appt for today, she declined as she is not feeling well. She would like to schedule for tomorrow. Appt scheduled for 2:00, as 1:30 did not work for patient.  Nothing further needed.   Routing to TP as an [place].

## 2022-08-18 NOTE — Telephone Encounter (Signed)
I sleep study shows mild sleep apnea.  Please set up office visit or virtual visit to discuss sleep study results in detail  Can double book today or tomorrow in the morning or first afternoon appointment

## 2022-08-19 ENCOUNTER — Telehealth (INDEPENDENT_AMBULATORY_CARE_PROVIDER_SITE_OTHER): Payer: Medicare Other | Admitting: Adult Health

## 2022-08-19 DIAGNOSIS — G4733 Obstructive sleep apnea (adult) (pediatric): Secondary | ICD-10-CM | POA: Diagnosis not present

## 2022-08-19 NOTE — Progress Notes (Signed)
Reviewed and agree with assessment/plan.   Coralyn Helling, MD [facility] Pulmonary/Critical Care 08/19/2022, 2:55 PM Pager:  [phone]

## 2022-08-19 NOTE — Progress Notes (Signed)
Virtual Visit via Video Note  I connected withAizlynn Noble on 08/19/22 at  2:00 PM EDT by a video enabled telemedicine application and verified that I am speaking with the correct person using two identifiers.  Location: Patient: Home  Provider: Office    I discussed the limitations of evaluation and management by telemedicine and the availability of in person appointments. The patient expressed understanding and agreed to proceed.  History of Present Illness: 72 year old female seen for sleep consult June 06, 2022 for snoring and daytime sleepiness found to have mild obstructive sleep apnea  Today's virtual visit is a 56-month follow-up to discuss sleep study results.  Patient was seen for a sleep consult on February 5 for snoring and daytime sleepiness.  She was set up for home sleep study was completed on July 20, 2022 that showed mild obstructive sleep apnea with AHI at 7.2/hour and SpO2 low at 84%.  We discussed her sleep study results and went over treatment options including positional sleep, oral appliance and CPAP therapy.  Patient is on trazodone for insomnia and Neurontin for chronic pain. Patient does not want to start on CPAP therapy.  She is currently undergoing dental implants so wants to wait on referral for dental appliance.  We discussed positional sleep avoiding sleeping on her back and she also has an adjustable bed raising the head of her bed to a 30 degree incline  She says she has been seen by her primary care provider this week and diagnosed with a bronchitis.  She is currently on antibiotics.  She has cough and congestion.  Observations/Objective: Appears in no acute distress  Assessment and Plan: Mild obstructive sleep apnea.  Patient declined CPAP therapy.  Currently not a candidate for oral appliance as she is undergoing dental implants.  Unclear if she would be able to use dental appliance going forward.  If she continues to have significant symptoms can  refer to orthodontics for an evaluation.  For now recommend avoiding sleeping on her back and elevated head of her bed to a 30 degree angle.  Acute bronchitis-complete antibiotics as prescribed  Follow-up with primary care as planned  Plan  Patient Instructions  Avoid sleeping on your back Elevated head of bed to 30 degrees Continue to work on healthy weight Healthy sleep regimen  Do not drive if sleepy  Follow up as needed . Call back if you would like referral to orthodontics for oral appliance evaluation .     Follow Up Instructions:    I discussed the assessment and treatment plan with the patient. The patient was provided an opportunity to ask questions and all were answered. The patient agreed with the plan and demonstrated an understanding of the instructions.   The patient was advised to call back or seek an in-person evaluation if the symptoms worsen or if the condition fails to improve as anticipated.  I provided 20 minutes of non-face-to-face time during this encounter.   Andrea Oaks, NP

## 2022-08-19 NOTE — Patient Instructions (Addendum)
Avoid sleeping on your back Elevated head of bed to 30 degrees Continue to work on healthy weight Healthy sleep regimen  Do not drive if sleepy  Follow up as needed . Call back if you would like referral to orthodontics for oral appliance evaluation .

## 2022-09-01 ENCOUNTER — Other Ambulatory Visit: Payer: Self-pay | Admitting: Internal Medicine

## 2022-09-15 ENCOUNTER — Encounter: Payer: Self-pay | Admitting: Internal Medicine

## 2022-09-15 ENCOUNTER — Ambulatory Visit (INDEPENDENT_AMBULATORY_CARE_PROVIDER_SITE_OTHER): Payer: Medicare Other | Admitting: Internal Medicine

## 2022-09-15 VITALS — BP 136/74 | HR 115 | Temp 96.7°F | Ht 63.5 in | Wt 149.0 lb

## 2022-09-15 DIAGNOSIS — R1031 Right lower quadrant pain: Secondary | ICD-10-CM | POA: Diagnosis not present

## 2022-09-15 DIAGNOSIS — R1032 Left lower quadrant pain: Secondary | ICD-10-CM | POA: Diagnosis not present

## 2022-09-15 DIAGNOSIS — J22 Unspecified acute lower respiratory infection: Secondary | ICD-10-CM | POA: Diagnosis not present

## 2022-09-15 NOTE — Assessment & Plan Note (Signed)
Might be soft tissue related to the PT she did No worrisome features on exam Discussed ice/lidocaine/topical diclofenac for the bursitis

## 2022-09-15 NOTE — Progress Notes (Signed)
Subjective:    Patient ID: Andrea Noble, female    DOB: 1951/01/24, 72 y.o.   MRN: [id number]  HPI Here for follow up of cough and respiratory infection  Doing "varied"--has new issue Pain in groin--started in left and now worse on right---goes back 3 weeks Sharp and intermittent Some worsening trying to get up from chair--like leg giving out Thinks it may be related to PT for hip bursitis Did have 2 falls several months ago--but doesn't think she got injured Back is sore--uses heating pad to relax  Hasn't been walking much Loses breath easily when walking in the heat  Cough is much better Still mild symptoms---but improved  Current Outpatient Medications on File Prior to Visit  Medication Sig Dispense Refill   acetaminophen (TYLENOL) 650 MG CR tablet Take 1,300 mg by mouth at bedtime.     albuterol (VENTOLIN HFA) 108 (90 Base) MCG/ACT inhaler Inhale 2 puffs into the lungs every 4 (four) hours as needed for wheezing or shortness of breath. 6.7 g 2   buPROPion (WELLBUTRIN SR) 200 MG 12 hr tablet TAKE 1 TABLET TWICE A DAY 180 tablet 3   busPIRone (BUSPAR) 15 MG tablet Take 1 tablet (15 mg total) by mouth 2 (two) times daily. 180 tablet 3   Calcium Carbonate-Vit D-Min (CALTRATE MINIS PLUS MINERALS PO) Take 40 mcg by mouth daily.     Carboxymethylcellulose Sodium (THERATEARS) 0.25 % SOLN Place 1 drop into both eyes daily as needed (Dry eyes).     CRANBERRY PO Take 4,200 mg by mouth daily at 12 noon.     diclofenac Sodium (VOLTAREN) 1 % GEL Apply 1 application topically 2 (two) times daily.     diltiazem (CARDIZEM CD) 120 MG 24 hr capsule Take 120 mg by mouth daily.     docusate sodium (COLACE) 100 MG capsule Take 100 mg by mouth 2 (two) times daily.     fluocinonide cream (LIDEX) 0.05 % Apply 1 application topically daily as needed (Eczema).     FLUoxetine (PROZAC) 20 MG capsule Take 1 capsule (20 mg total) by mouth daily. 30 capsule 1   fluticasone-salmeterol (WIXELA INHUB) 100-50  MCG/ACT AEPB USE 1 INHALATION TWICE A DAY 180 each 3   gabapentin (NEURONTIN) 100 MG capsule TAKE 2 CAPSULES THREE TIMES A DAY 180 capsule 11   levothyroxine (SYNTHROID, LEVOTHROID) 50 MCG tablet Take 50-75 mcg by mouth See admin instructions. Take 75 mg every  Monday, Wednesday and Friday Take 50 mg all the other days     liothyronine (CYTOMEL) 5 MCG tablet Take 2.5 mcg by mouth daily.     meclizine (ANTIVERT) 25 MG tablet Take 25 mg by mouth daily as needed (Dizzyness).     Melatonin 10 MG TABS Take 10 mg by mouth at bedtime. Timed-Release     montelukast (SINGULAIR) 10 MG tablet TAKE 1 TABLET AT BEDTIME 100 tablet 3   Multiple Vitamin (MULTIVITAMIN) tablet Take 1 tablet by mouth daily. Vita Fusion adult Gummie     pantoprazole (PROTONIX) 40 MG tablet Take 40 mg by mouth every morning.     Polyethyl Glycol-Propyl Glycol (SYSTANE) 0.4-0.3 % GEL ophthalmic gel Place 1 application into both eyes at bedtime.     polyethylene glycol powder (GLYCOLAX/MIRALAX) 17 GM/SCOOP powder Take 17 g by mouth daily.     pravastatin (PRAVACHOL) 40 MG tablet TAKE 2 TABLETS(80 MG) BY MOUTH DAILY 180 tablet 3   PREMPRO 0.45-1.5 MG tablet Take 1 tablet by mouth daily.  Probiotic Product (PHILLIPS COLON HEALTH) CAPS Take 1 capsule by mouth at bedtime.     sodium chloride (OCEAN) 0.65 % SOLN nasal spray Place 1 spray into both nostrils 2 (two) times daily.     solifenacin (VESICARE) 10 MG tablet Take 10 mg by mouth every other day. 1530     traZODone (DESYREL) 50 MG tablet TAKE 1 TABLET AT BEDTIME 90 tablet 3   No current facility-administered medications on file prior to visit.    Allergies  Allergen Reactions   Levofloxacin     IV Levaquin causes burning   Other Itching    Surgical glue    Past Medical History:  Diagnosis Date   Anxiety    Arthritis    Asthma    Cataract    Depression    Diverticulitis    GERD (gastroesophageal reflux disease)    Hodgkin's lymphoma (HCC) 2006   underwent  radiation and chemo, last CT scan since moving to [place] from [place] 2018   Hyperlipidemia    Hypothyroidism    Menopausal syndrome    OAB (overactive bladder)    Pneumonia    Seasonal allergies    SVT (supraventricular tachycardia)    Thyroid disease    hypothyroid    Past Surgical History:  Procedure Laterality Date   BLEPHAROPLASTY Bilateral    BREAST BIOPSY     benign   BREAST BIOPSY  07/27/2018   [place] core bx venus COLUMNAR CELL METAPLASIA, AND PSEUDOANGIOMATOUS STROMAL   CARPAL TUNNEL RELEASE Right    CARPAL TUNNEL RELEASE Left    CATARACT EXTRACTION Bilateral 2015   CLEFT LIP REPAIR     COLONOSCOPY WITH PROPOFOL N/A 07/09/2018   Procedure: COLONOSCOPY WITH PROPOFOL;  Surgeon: Christena Deem, MD;  Location: [facility] ENDOSCOPY;  Service: Endoscopy;  Laterality: N/A;   DE QUERVAIN'S RELEASE Bilateral    DIAGNOSTIC LAPAROSCOPY     endometriosis   ESOPHAGOGASTRODUODENOSCOPY N/A 07/09/2018   Procedure: ESOPHAGOGASTRODUODENOSCOPY (EGD);  Surgeon: Christena Deem, MD;  Location[facility] ENDOSCOPY;  Service: Endoscopy;  Laterality: N/A;   FINGER SURGERY Right    Middle finger joint replaced   MAXIMUM ACCESS (MAS) TRANSFORAMINAL LUMBAR INTERBODY FUSION (TLIF) 2 LEVEL Right 07/06/2020   Procedure: L4/5, L5/S1 TRANSFORAMINAL LUMBAR INTERBODY FUSION (TLIF), L4-S1 PEDICLE SCREWS,DECOMPRESSION ;  Surgeon: Lucy Chris, MD;  Location: [facility] ORS;  Service: Neurosurgery;  Laterality: Right;   THUMB SURGERY Right    took tendon from forearm and wrapped it around the thumb joint    Family History  Problem Relation Age of Onset   Breast cancer Maternal Aunt    Anuerysm Mother    Stroke Mother    Heart attack Father    Alzheimer's disease Father    Developmental delay Brother     Social History   Socioeconomic History   Marital status: Married    Spouse name: Financial planner   Number of children: 0   Years of education: Not on file   Highest education level: Master's degree (e.g., MA, MS, MEng, MEd,  MSW, MBA)  Occupational History   Occupation: Social work/chaplain    Comment: 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 [place]  Tobacco Use   Smoking status: Never    Passive exposure: Past   Smokeless tobacco: Never  Vaping Use   Vaping Use: Never used  Substance and Sexual Activity   Alcohol use: Yes    Comment: very occasional once a week   Drug use: No   Sexual activity: Not Currently  Other Topics Concern  Not on file  Social History Narrative   Has living will   Husband is health care POA-- sister Andrea Noble is alternate   Would accept resuscitation attempts   No feeding tube if cognitively unaware   Social Determinants of Health   Financial Resource Strain: Not on file  Food Insecurity: Not on file  Transportation Needs: Not on file  Physical Activity: Not on file  Stress: Not on file  Social Connections: Not on file  Intimate Partner Violence: Not on file   Review of Systems Legs do feel weak No fever     Objective:   Physical Exam Constitutional:      Appearance: Normal appearance.  Pulmonary:     Effort: Pulmonary effort is normal.     Breath sounds: Normal breath sounds. No wheezing or rales.  Musculoskeletal:     Comments: ?slight tenderness over sacrum--not otherwise over spine ROM in hips is normal Some tenderness deep in posterior right hip SLR negative  Neurological:     Mental Status: She is alert.     Comments: Normal gait Mild weakness in right hip flexion and extension (pain related)            Assessment & Plan:

## 2022-09-15 NOTE — Assessment & Plan Note (Signed)
Cough and symptoms are better Just had some atelectasis on CXR--so will hold off on repeat

## 2022-09-27 ENCOUNTER — Other Ambulatory Visit: Payer: Self-pay | Admitting: Orthopedic Surgery

## 2022-09-27 DIAGNOSIS — R1031 Right lower quadrant pain: Secondary | ICD-10-CM
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
## 2022-10-09 ENCOUNTER — Other Ambulatory Visit: Payer: Self-pay | Admitting: Psychiatry

## 2022-10-10 ENCOUNTER — Ambulatory Visit (INDEPENDENT_AMBULATORY_CARE_PROVIDER_SITE_OTHER): Payer: Medicare Other | Admitting: Psychiatry

## 2022-10-10 ENCOUNTER — Ambulatory Visit
Admission: RE | Admit: 2022-10-10 | Discharge: 2022-10-10 | Disposition: A | Discharge status: Home / Self Care | Payer: Medicare Other | Source: Ambulatory Visit | Attending: Orthopedic Surgery | Admitting: Orthopedic Surgery

## 2022-10-10 ENCOUNTER — Encounter: Payer: Self-pay | Admitting: Psychiatry

## 2022-10-10 VITALS — BP 180/77 | HR 116 | Temp 98.4°F | Ht 63.5 in | Wt 148.8 lb

## 2022-10-10 DIAGNOSIS — F4310 Post-traumatic stress disorder, unspecified: Secondary | ICD-10-CM | POA: Diagnosis not present

## 2022-10-10 DIAGNOSIS — R1031 Right lower quadrant pain: Secondary | ICD-10-CM

## 2022-10-10 DIAGNOSIS — F331 Major depressive disorder, recurrent, moderate: Secondary | ICD-10-CM | POA: Diagnosis not present

## 2022-10-10 DIAGNOSIS — R5383 Other fatigue: Secondary | ICD-10-CM

## 2022-10-10 MED ORDER — FLUOXETINE HCL 20 MG PO CAPS: 20.0000 mg | ORAL_CAPSULE | Freq: Every day | ORAL | 0 refills | Status: DC

## 2022-10-13 ENCOUNTER — Telehealth: Payer: Self-pay | Admitting: Adult Health

## 2022-10-13 DIAGNOSIS — G4733 Obstructive sleep apnea (adult) (pediatric): Secondary | ICD-10-CM

## 2022-10-13 NOTE — Telephone Encounter (Signed)
PT had declined CPAP therapy in the past when Ms. Parrett did her televisit in April. States she is ready to give it a go. Please call to advise where we go from here.  PT # is [phone]  She prefers not to have masks and hoses but has heard inspire.

## 2022-10-13 NOTE — Telephone Encounter (Signed)
She is not a candidate for INSPIRE- required moderate OSA. She has mild OSA.  Can begin CPAP auto 5-15 cmH2O. Mask of choice  Ov in 3 months for follow up

## 2022-10-13 NOTE — Telephone Encounter (Signed)
ATC pt left detailed VM informing her of TP message. Order placed for CPAP and recall placed in pt chart

## 2022-10-13 NOTE — Telephone Encounter (Signed)
Forwarded message to triage.

## 2022-11-02 ENCOUNTER — Encounter: Payer: Self-pay | Admitting: Family Medicine

## 2022-11-02 ENCOUNTER — Ambulatory Visit (INDEPENDENT_AMBULATORY_CARE_PROVIDER_SITE_OTHER)
Admission: RE | Admit: 2022-11-02 | Discharge: 2022-11-02 | Disposition: A | Discharge status: Home / Self Care | Payer: Medicare Other | Source: Ambulatory Visit | Attending: Family Medicine | Admitting: Family Medicine

## 2022-11-02 ENCOUNTER — Ambulatory Visit (INDEPENDENT_AMBULATORY_CARE_PROVIDER_SITE_OTHER): Payer: Medicare Other | Admitting: Family Medicine

## 2022-11-02 VITALS — BP 156/76 | HR 100 | Temp 97.8°F | Ht 63.5 in | Wt 147.1 lb

## 2022-11-02 DIAGNOSIS — R052 Subacute cough: Secondary | ICD-10-CM

## 2022-11-02 DIAGNOSIS — I10 Essential (primary) hypertension: Secondary | ICD-10-CM | POA: Insufficient documentation

## 2022-11-02 DIAGNOSIS — J4520 Mild intermittent asthma, uncomplicated: Secondary | ICD-10-CM | POA: Diagnosis not present

## 2022-11-02 LAB — POC COVID19 BINAXNOW: SARS Coronavirus 2 Ag: NEGATIVE

## 2022-11-02 MED ORDER — GUAIFENESIN ER 600 MG PO TB12: 600.0000 mg | ORAL_TABLET | Freq: Every day | ORAL | Status: DC

## 2022-11-02 MED ORDER — AZITHROMYCIN 250 MG PO TABS: ORAL_TABLET | ORAL | 0 refills | Status: DC

## 2022-11-02 NOTE — Assessment & Plan Note (Signed)
In setting of not feeling well.  I have asked her to start monitoring blood pressures at home and notify cardiology if persistently >150/90.

## 2022-11-02 NOTE — Progress Notes (Signed)
Ph: [phone] Fax: [phone]   Patient ID: Andrea [place], female    DOB: 03-16-1951, 72 y.o.   MRN: [id number]  This visit was conducted in [place].  BP (!) 156/76 (BP Location: Right Arm, Cuff Size: Normal)   Pulse 100   Temp 97.8 F (36.6 C) (Temporal)   Ht 5' 3.5" (1.613 m)   Wt 147 lb 2 oz (66.7 kg)   SpO2 99%   BMI 25.65 kg/m   BP Readings from Last 3 Encounters:  11/02/22 (!) 156/76  09/15/22 136/74  08/15/22 138/70   CC: ongoing cough  Subjective:   HPI: Andrea [place] is a 72 y.o. female presenting on 11/02/2022 for Cough (C/o ongoing cough for past 3 mos. Seen previously, tx abx. )   08/2022 - acute bronchitis treated with zpack and augmentin for possible tooth and sinus involvement.  08/2022 - saw PCP in follow up with significant improvement in symptoms.   6 wk h/o cough "feels like mucous plug stuck in throat" - productive of yellow and orange mucous. Currently having trouble getting mucous out. Having R sided sore throat. Some exertional dyspnea without wheezing. Head > chest congestion. Notes increased stress recently - just moved step mother into assisted living.   Denies fevers/chills, ear or tooth pain, abd pain.  This past Sunday had bad coughing fit when she tried to sing in choir at church.  No recent steroid course.  Known h/o asthma normally on controller inhaler Wixela 100/50 1 puff BID, also takes singulair 10mg  daily and astelin and saline nasal spray. She also regularly takes extended release mucinex 1 tab daily.   Has not been using albuterol inhaler regularly - last used several months ago. Upcoming dental implant schedule for 12/2022  CXR 08/2022 - mild bronchial thickening, bandlike subsegmental atelectasis within medial lower lobes  BP elevation noted today - this is despite dilitazem 120mg  daily.   H/o Hodgkin's lymphoma 2006 s/p chemo/radiation  She did have RSV vaccine and COVID booster several weeks ago.      Relevant past  medical, surgical, family and social history reviewed and updated as indicated. Interim medical history since our last visit reviewed. Allergies and medications reviewed and updated. Outpatient Medications Prior to Visit  Medication Sig Dispense Refill   acetaminophen (TYLENOL) 650 MG CR tablet Take 1,300 mg by mouth at bedtime.     albuterol (VENTOLIN HFA) 108 (90 Base) MCG/ACT inhaler Inhale 2 puffs into the lungs every 4 (four) hours as needed for wheezing or shortness of breath. 6.7 g 2   azelastine (ASTELIN) 0.1 % nasal spray Place 2 sprays into both nostrils 2 (two) times daily. Use in each nostril as directed     buPROPion (WELLBUTRIN SR) 200 MG 12 hr tablet TAKE 1 TABLET TWICE A DAY 180 tablet 3   busPIRone (BUSPAR) 15 MG tablet Take 1 tablet (15 mg total) by mouth 2 (two) times daily. 180 tablet 3   Calcium Carbonate-Vit D-Min (CALTRATE MINIS PLUS MINERALS PO) Take 40 mcg by mouth daily.     Carboxymethylcellulose Sodium (THERATEARS) 0.25 % SOLN Place 1 drop into both eyes daily as needed (Dry eyes).     CRANBERRY PO Take 4,200 mg by mouth daily at 12 noon.     diclofenac Sodium (VOLTAREN) 1 % GEL Apply 1 application topically 2 (two) times daily.     diltiazem (CARDIZEM CD) 120 MG 24 hr capsule Take 120 mg by mouth daily.     docusate sodium (COLACE) 100  MG capsule Take 100 mg by mouth 2 (two) times daily.     fluocinonide cream (LIDEX) 0.05 % Apply 1 application topically daily as needed (Eczema).     FLUoxetine (PROZAC) 20 MG capsule Take 1 capsule (20 mg total) by mouth daily. 90 capsule 0   fluticasone-salmeterol (WIXELA INHUB) 100-50 MCG/ACT AEPB USE 1 INHALATION TWICE A DAY 180 each 3   gabapentin (NEURONTIN) 100 MG capsule TAKE 2 CAPSULES THREE TIMES A DAY 180 capsule 11   levothyroxine (SYNTHROID, LEVOTHROID) 50 MCG tablet Take 50-75 mcg by mouth See admin instructions. Take 75 mg every  Monday, Wednesday and Friday Take 50 mg all the other days     liothyronine (CYTOMEL) 5 MCG  tablet Take 2.5 mcg by mouth daily.     meclizine (ANTIVERT) 25 MG tablet Take 25 mg by mouth daily as needed (Dizzyness).     Melatonin 10 MG TABS Take 10 mg by mouth at bedtime. Timed-Release     montelukast (SINGULAIR) 10 MG tablet TAKE 1 TABLET AT BEDTIME 100 tablet 3   Multiple Vitamin (MULTIVITAMIN) tablet Take 1 tablet by mouth daily. Vita Fusion adult Gummie     pantoprazole (PROTONIX) 40 MG tablet Take 40 mg by mouth every morning.     Polyethyl Glycol-Propyl Glycol (SYSTANE) 0.4-0.3 % GEL ophthalmic gel Place 1 application into both eyes at bedtime.     polyethylene glycol powder (GLYCOLAX/MIRALAX) 17 GM/SCOOP powder Take 17 g by mouth daily.     pravastatin (PRAVACHOL) 40 MG tablet TAKE 2 TABLETS(80 MG) BY MOUTH DAILY 180 tablet 3   PREMPRO 0.45-1.5 MG tablet Take 1 tablet by mouth daily.     Probiotic Product (PHILLIPS COLON HEALTH) CAPS Take 1 capsule by mouth at bedtime.     sodium chloride (OCEAN) 0.65 % SOLN nasal spray Place 1 spray into both nostrils 2 (two) times daily.     solifenacin (VESICARE) 10 MG tablet Take 10 mg by mouth every other day. 1530     traZODone (DESYREL) 50 MG tablet TAKE 1 TABLET AT BEDTIME 90 tablet 3   glucosamine-chondroitin 500-400 MG tablet Take 1 tablet by mouth once.     No facility-administered medications prior to visit.     Per HPI unless specifically indicated in ROS section below Review of Systems  Objective:  BP (!) 156/76 (BP Location: Right Arm, Cuff Size: Normal)   Pulse 100   Temp 97.8 F (36.6 C) (Temporal)   Ht 5' 3.5" (1.613 m)   Wt 147 lb 2 oz (66.7 kg)   SpO2 99%   BMI 25.65 kg/m   Wt Readings from Last 3 Encounters:  11/02/22 147 lb 2 oz (66.7 kg)  09/15/22 149 lb (67.6 kg)  08/15/22 146 lb (66.2 kg)      Physical Exam Vitals and nursing note reviewed.  Constitutional:      Appearance: Normal appearance. She is not ill-appearing.  HENT:     Head: Normocephalic and atraumatic.     Right Ear: Tympanic membrane,  ear canal and external ear normal.     Left Ear: Tympanic membrane, ear canal and external ear normal.     Nose: Congestion present. No rhinorrhea.     Mouth/Throat:     Mouth: Mucous membranes are moist.     Pharynx: Oropharynx is clear. No oropharyngeal exudate or posterior oropharyngeal erythema.  Eyes:     Extraocular Movements: Extraocular movements intact.     Conjunctiva/sclera: Conjunctivae normal.     Pupils: Pupils  are equal, round, and reactive to light.  Neck:     Thyroid: No thyroid mass or thyromegaly.  Cardiovascular:     Rate and Rhythm: Normal rate and regular rhythm.     Pulses: Normal pulses.     Heart sounds: Normal heart sounds. No murmur heard. Pulmonary:     Effort: Pulmonary effort is normal. No respiratory distress.     Breath sounds: Normal breath sounds. No wheezing, rhonchi or rales.     Comments: Lungs largely clear Musculoskeletal:     Cervical back: Normal range of motion and neck supple. No tenderness.     Right lower leg: No edema.     Left lower leg: No edema.  Lymphadenopathy:     Cervical: No cervical adenopathy.  Skin:    General: Skin is warm and dry.     Findings: No rash.  Neurological:     General: No focal deficit present.     Mental Status: She is alert.  Psychiatric:        Mood and Affect: Mood normal.        Behavior: Behavior normal.        Results for orders placed or performed in visit on 11/02/22  POC COVID-19 BinaxNow  Result Value Ref Range   SARS Coronavirus 2 Ag Negative Negative    Assessment & Plan:   Problem List Items Addressed This Visit     Intermittent asthma, well controlled   Cough - Primary    Subacute cough present for 6 weeks associated with increased sputum production and coughing fits - anticipate acute bronchitis in asthmatic.  Exam, vitals overall reassuring. No indication for steroid at this time.  Given longstanding cough, will update CXR and Rx zpack antibiotic.  Discussed albuterol inh use -  she will try this. Discussed mucinex use - she will increase to BID x 3 days.  If not improving with treatment, rec f/u with PCP. Pt agrees with plan.  Check COVID swab.       Relevant Orders   DG Chest 2 View   POC COVID-19 BinaxNow (Completed)   Elevated blood pressure reading with diagnosis of hypertension    In setting of not feeling well.  I have asked her to start monitoring blood pressures at home and notify cardiology if persistently >150/90.         Meds ordered this encounter  Medications   guaiFENesin (MUCINEX) 600 MG 12 hr tablet    Sig: Take 1 tablet (600 mg total) by mouth daily.   azithromycin (ZITHROMAX) 250 MG tablet    Sig: Take two tablets on day one followed by one tablet on days 2-5    Dispense:  6 each    Refill:  0    Orders Placed This Encounter  Procedures   DG Chest 2 View    Standing Status:   Future    Number of Occurrences:   1    Standing Expiration Date:   11/02/2023    Order Specific Question:   Reason for Exam (SYMPTOM  OR DIAGNOSIS REQUIRED)    Answer:   cough x 6 wks, asthmatic    Order Specific Question:   Preferred imaging location?    Answer:   [facility]   POC COVID-19 BinaxNow    Order Specific Question:   Previously tested for COVID-19    Answer:   No    Order Specific Question:   Resident in a congregate (group) care setting  Answer:   No    Order Specific Question:   Employed in healthcare setting    Answer:   No    Order Specific Question:   Pregnant    Answer:   No    Patient Instructions  BP was higher than normal. Start monitoring more closely at home and let cardiology know if consistently >150/90.  For cough - take zpack antibiotic for possible bronchitis with asthma, may take guaifenesin or plain mucinex with glass of water to mobilize mucous.  May try Ventolin (albuterol) rescue inhaler as needed for cough, wheezing or shortness of breath.  If not better with this, return to see Dr Alphonsus Sias for follow up.    Follow up plan: No follow-ups on file.  Eustaquio Boyden, MD

## 2022-11-02 NOTE — Assessment & Plan Note (Addendum)
Subacute cough present for 6 weeks associated with increased sputum production and coughing fits - anticipate acute bronchitis in asthmatic.  Exam, vitals overall reassuring. No indication for steroid at this time.  Given longstanding cough, will update CXR and Rx zpack antibiotic.  Discussed albuterol inh use - she will try this. Discussed mucinex use - she will increase to BID x 3 days.  If not improving with treatment, rec f/u with PCP. Pt agrees with plan.  Check COVID swab.

## 2022-11-02 NOTE — Patient Instructions (Addendum)
BP was higher than normal. Start monitoring more closely at home and let cardiology know if consistently >150/90.  For cough - take zpack antibiotic for possible bronchitis with asthma, may take guaifenesin or plain mucinex with glass of water to mobilize mucous.  May try Ventolin (albuterol) rescue inhaler as needed for cough, wheezing or shortness of breath.  If not better with this, return to see Dr Alphonsus Sias for follow up.

## 2022-11-08 ENCOUNTER — Telehealth: Payer: Self-pay

## 2022-11-08 MED ORDER — AMOXICILLIN-POT CLAVULANATE 875-125 MG PO TABS: 1.0000 | ORAL_TABLET | Freq: Two times a day (BID) | ORAL | 0 refills | Status: AC

## 2022-11-08 NOTE — Telephone Encounter (Signed)
Patient called back in and stated that she may have a sinus infection. She stated that she is experiencing one new symptom which is head congestion/stuffiness.

## 2022-11-08 NOTE — Addendum Note (Signed)
Addended by: Eustaquio Boyden on: 11/08/2022 02:07 PM   Modules accepted: Orders

## 2022-11-08 NOTE — Telephone Encounter (Signed)
Patient called in returning call she received. Relayed Dr. Timoteo Expose message below and patient stated that she is still feeling the same way she felt when she came in. She is experiencing a cough and phlegm that is yellowish/orange. She stated that she used the inhaler in the morning before going on her walks but she do continue to cough and have phlegm coming up. She stated that it could possibly be allergies. Thank you!

## 2022-11-08 NOTE — Telephone Encounter (Signed)
Given duration of symptoms reasonable to treat possible sinusitis.  Plz notify I've sent in augmentin 7d course to her pharmacy.

## 2022-11-08 NOTE — Telephone Encounter (Signed)
Lvm asking pt to call back. Need to relay x-ray results and get answers to Dr. Timoteo Expose questions.  X-ray/Dr. Timoteo Expose questions: Your xray from last week didn't show obvious pneumonia/infection. How are your symptoms after azithromycin antibiotic and guaifenesin expectorant use twice daily? Did the albuterol inhaler help any?

## 2022-11-08 NOTE — Telephone Encounter (Signed)
Called patient reviewed all information and repeated back to me. Will call if any questions.  She will pick up meds today and let us know if any issues.

## 2022-11-14 DIAGNOSIS — M25561 Pain in right knee: Secondary | ICD-10-CM | POA: Insufficient documentation

## 2022-11-14 DIAGNOSIS — M1711 Unilateral primary osteoarthritis, right knee: Secondary | ICD-10-CM | POA: Insufficient documentation

## 2022-11-14 DIAGNOSIS — M1611 Unilateral primary osteoarthritis, right hip: Secondary | ICD-10-CM | POA: Insufficient documentation

## 2022-11-14 DIAGNOSIS — M25551 Pain in right hip: Secondary | ICD-10-CM | POA: Insufficient documentation

## 2022-12-04 ENCOUNTER — Encounter: Payer: Self-pay | Admitting: Emergency Medicine

## 2022-12-04 ENCOUNTER — Emergency Department: Payer: Medicare Other

## 2022-12-04 ENCOUNTER — Emergency Department
Admission: EM | Admit: 2022-12-04 | Discharge: 2022-12-04 | Disposition: A | Discharge status: Home / Self Care | Payer: Medicare Other | Attending: Emergency Medicine | Admitting: Emergency Medicine

## 2022-12-04 DIAGNOSIS — U071 COVID-19: Secondary | ICD-10-CM | POA: Insufficient documentation

## 2022-12-04 DIAGNOSIS — M791 Myalgia, unspecified site: Secondary | ICD-10-CM | POA: Diagnosis present

## 2022-12-04 DIAGNOSIS — E039 Hypothyroidism, unspecified: Secondary | ICD-10-CM | POA: Insufficient documentation

## 2022-12-04 DIAGNOSIS — I10 Essential (primary) hypertension: Secondary | ICD-10-CM | POA: Diagnosis not present

## 2022-12-04 MED ORDER — BENZONATATE 100 MG PO CAPS
200.0000 mg | ORAL_CAPSULE | Freq: Once | ORAL | Status: AC
  Administered 2022-12-04: 200 mg via ORAL
  Filled 2022-12-04: qty 2

## 2022-12-04 MED ORDER — NIRMATRELVIR/RITONAVIR (PAXLOVID)TABLET: 3.0000 | ORAL_TABLET | Freq: Two times a day (BID) | ORAL | 0 refills | Status: AC

## 2022-12-04 MED ORDER — BENZONATATE 100 MG PO CAPS: ORAL_CAPSULE | ORAL | 0 refills | Status: DC

## 2022-12-04 MED ORDER — DEXTROMETHORPHAN POLISTIREX ER 30 MG/5ML PO SUER
30.0000 mg | Freq: Once | ORAL | Status: AC
  Administered 2022-12-04: 30 mg via ORAL
  Filled 2022-12-04: qty 5

## 2022-12-04 NOTE — Discharge Instructions (Addendum)
Take the prescription meds as directed. Take OTC Delsym for additional cough relief. Hold your Wixela and Vesicare while dosing the Paxlovid. Follow-up with your provider as discussed.

## 2022-12-04 NOTE — ED Provider Notes (Signed)
[facility] Emergency Department Provider Note     Event Date/Time   First MD Initiated Contact with Patient 12/04/22 1844     (approximate)   History   COVID+   HPI  Andrea [place] is a 72 y.o. female with a history of RA, thyroid disease, GERD, hypothyroidism, and HTN, presents to the ED for evaluation of positive COVID test at home today.  Patient endorsed onset of joint pains, myalgias, and fatigue today.  She would also give a remote history of several weeks of intermittent cough and congestion treated with empiric doses of antibiotics called in by her primary provider.  Physical Exam   Triage Vital Signs: ED Triage Vitals  Encounter Vitals Group     BP 12/04/22 1833 133/72     Systolic BP Percentile --      Diastolic BP Percentile --      Pulse Rate 12/04/22 1833 100     Resp 12/04/22 1833 20     Temp 12/04/22 1833 97.9 F (36.6 C)     Temp Source 12/04/22 1833 Oral     SpO2 12/04/22 1833 98 %     Weight 12/04/22 1833 147 lb (66.7 kg)     Height 12/04/22 1833 5' 3.5" (1.613 m)     Head Circumference --      Peak Flow --      Pain Score 12/04/22 1834 5     Pain Loc --      Pain Education --      Exclude from Growth Chart --     Most recent vital signs: Vitals:   12/04/22 1833  BP: 133/72  Pulse: 100  Resp: 20  Temp: 97.9 F (36.6 C)  SpO2: 98%    General Awake, no distress. NAD HEENT NCAT. PERRL. EOMI. No rhinorrhea. Mucous membranes are moist.  CV:  Good peripheral perfusion. RRR RESP:  Normal effort. CTA ABD:  No distention.    ED Results / Procedures / Treatments   Labs (all labs ordered are listed, but only abnormal results are displayed) Labs Reviewed - No data to display   EKG  RADIOLOGY  I personally viewed and evaluated these images as part of my medical decision making, as well as reviewing the written report by the radiologist.  ED Provider Interpretation: no acute findings  DG Chest 2 View  Result  Date: 12/04/2022 CLINICAL DATA:  COVID positive with cough and body aches. EXAM: CHEST - 2 VIEW COMPARISON:  November 02, 2022 FINDINGS: The heart size and mediastinal contours are within normal limits. Mild, diffuse, chronic appearing increased lung markings are seen. No pleural effusion or pneumothorax is identified. Multilevel degenerative changes are seen throughout the thoracic spine. IMPRESSION: Chronic appearing increased lung markings without acute or active cardiopulmonary disease. Electronically Signed   By: Aram Candela M.D.   On: 12/04/2022 19:37     PROCEDURES:  Critical Care performed: No  Procedures   MEDICATIONS ORDERED IN ED: Medications  benzonatate (TESSALON) capsule 200 mg (200 mg Oral Given 12/04/22 2007)  dextromethorphan (DELSYM) 30 MG/5ML liquid 30 mg (30 mg Oral Given 12/04/22 2007)     IMPRESSION / MDM / ASSESSMENT AND PLAN / ED COURSE  I reviewed the triage vital signs and the nursing notes.                              Differential diagnosis includes, but is not limited  to, COVID, flu, RSV, CAP, bronchitis  Patient's presentation is most consistent with acute complicated illness / injury requiring diagnostic workup.  Patient's diagnosis is consistent with COVID as confirmed by home test. Patient will be discharged home with prescriptions for Paxlovid and Tessalon Perles.  Patient is advised to discontinue her Wixela and Vesicare secondary to drug interactions.  Patient is to follow up with primary provider as needed or otherwise directed. Patient is given ED precautions to return to the ED for any worsening or new symptoms.   FINAL CLINICAL IMPRESSION(S) / ED DIAGNOSES   Final diagnoses:  COVID     Rx / DC Orders   ED Discharge Orders          Ordered    nirmatrelvir/ritonavir (PAXLOVID) 20 x 150 MG & 10 x 100MG  TABS  2 times daily        12/04/22 1952    benzonatate (TESSALON PERLES) 100 MG capsule        12/04/22 1954             Note:   This document was prepared using Dragon voice recognition software and may include unintentional dictation errors.    Lissa Hoard, PA-C 12/04/22 2010    Merwyn Katos, MD 12/04/22 [phone]

## 2022-12-04 NOTE — ED Triage Notes (Signed)
Pt presents ambulatory to triage via POV with complaints of being COVID+, tested today and was advised by UC to come in for Paxlovid medication. Pt endorses a cough and generalized body aches x 3 days. Afebrile but has taken gabapentin and tylenol PTA. A&Ox4 at this time. Denies CP or SOB.

## 2022-12-05 ENCOUNTER — Ambulatory Visit: Payer: Medicare Other | Admitting: Psychiatry

## 2022-12-05 ENCOUNTER — Telehealth: Payer: Self-pay

## 2022-12-05 NOTE — Telephone Encounter (Signed)
Per chart review tab pt was seen at [facility] ED on 12/04/22 and was given paxlovid and benzonatate. Sending note to Dr Alphonsus Sias.

## 2022-12-05 NOTE — Telephone Encounter (Signed)
Noted  

## 2022-12-14 ENCOUNTER — Ambulatory Visit (INDEPENDENT_AMBULATORY_CARE_PROVIDER_SITE_OTHER): Payer: Medicare Other | Admitting: Internal Medicine

## 2022-12-14 ENCOUNTER — Encounter: Payer: Self-pay | Admitting: Internal Medicine

## 2022-12-14 VITALS — BP 128/72 | HR 100 | Temp 97.9°F | Ht 63.5 in | Wt 147.0 lb

## 2022-12-14 DIAGNOSIS — R5383 Other fatigue: Secondary | ICD-10-CM | POA: Insufficient documentation

## 2022-12-14 DIAGNOSIS — F39 Unspecified mood [affective] disorder: Secondary | ICD-10-CM

## 2022-12-14 DIAGNOSIS — J4520 Mild intermittent asthma, uncomplicated: Secondary | ICD-10-CM | POA: Diagnosis not present

## 2022-12-14 MED ORDER — PREDNISONE 20 MG PO TABS: 40.0000 mg | ORAL_TABLET | Freq: Every day | ORAL | 0 refills | Status: DC

## 2022-12-14 NOTE — Assessment & Plan Note (Signed)
This is part of the SOB No sig wheezing but tight cough Will restart the wixela Prednisone 40mg  x 3, 20mg  x 3 till that kicks in

## 2022-12-14 NOTE — Progress Notes (Signed)
Subjective:    Patient ID: Andrea Noble, female    DOB: [birth year], 72 y.o.   MRN: [id number]  HPI Here due to ongoing symptoms despite treatment for COVID  Has had cough for over a month Seen here and got z-pak Doesn't think she improved from this---persistent cough  Then worsened after travel to [place] Remembers being exhausted during the trip--but not clear how sick she was Knows things got worse on trip home Cough worsened---COVID test positive Went to ER 8/4 --CXR was okay. Started on paxlovid Got sick from the paxlovid---barely able to finish it (nausea mostly) Had to stop the wixela and vesicare--bladder okay without it Hadn't restarted the wixela yet--just using albuterol and montelukast  Still feels tired easily Has been able to get out a little--walking just short distances Some SOB still No clear wheezing--but lots of sounds  Current Outpatient Medications on File Prior to Visit  Medication Sig Dispense Refill   acetaminophen (TYLENOL) 650 MG CR tablet Take 1,300 mg by mouth at bedtime.     albuterol (VENTOLIN HFA) 108 (90 Base) MCG/ACT inhaler Inhale 2 puffs into the lungs every 4 (four) hours as needed for wheezing or shortness of breath. 6.7 g 2   azelastine (ASTELIN) 0.1 % nasal spray Place 2 sprays into both nostrils 2 (two) times daily. Use in each nostril as directed     benzonatate (TESSALON PERLES) 100 MG capsule Take 1-2 tabs TID prn cough 30 capsule 0   buPROPion (WELLBUTRIN SR) 200 MG 12 hr tablet TAKE 1 TABLET TWICE A DAY 180 tablet 3   busPIRone (BUSPAR) 15 MG tablet Take 1 tablet (15 mg total) by mouth 2 (two) times daily. 180 tablet 3   Calcium Carbonate-Vit D-Min (CALTRATE MINIS PLUS MINERALS PO) Take 40 mcg by mouth daily.     Carboxymethylcellulose Sodium (THERATEARS) 0.25 % SOLN Place 1 drop into both eyes daily as needed (Dry eyes).     CRANBERRY PO Take 4,200 mg by mouth daily at 12 noon.     diclofenac Sodium (VOLTAREN) 1 % GEL Apply 1  application topically 2 (two) times daily.     diltiazem (CARDIZEM CD) 120 MG 24 hr capsule Take 120 mg by mouth daily.     docusate sodium (COLACE) 100 MG capsule Take 100 mg by mouth 2 (two) times daily.     fluocinonide cream (LIDEX) 0.05 % Apply 1 application topically daily as needed (Eczema).     FLUoxetine (PROZAC) 20 MG capsule Take 1 capsule (20 mg total) by mouth daily. 90 capsule 0   fluticasone-salmeterol (WIXELA INHUB) 100-50 MCG/ACT AEPB USE 1 INHALATION TWICE A DAY 180 each 3   gabapentin (NEURONTIN) 100 MG capsule TAKE 2 CAPSULES THREE TIMES A DAY 180 capsule 11   guaiFENesin (MUCINEX) 600 MG 12 hr tablet Take 1 tablet (600 mg total) by mouth daily.     levothyroxine (SYNTHROID, LEVOTHROID) 50 MCG tablet Take 50-75 mcg by mouth See admin instructions. Take 75 mg every  Monday, Wednesday and Friday Take 50 mg all the other days     liothyronine (CYTOMEL) 5 MCG tablet Take 2.5 mcg by mouth daily.     meclizine (ANTIVERT) 25 MG tablet Take 25 mg by mouth daily as needed (Dizzyness).     Melatonin 10 MG TABS Take 10 mg by mouth at bedtime. Timed-Release     montelukast (SINGULAIR) 10 MG tablet TAKE 1 TABLET AT BEDTIME 100 tablet 3   Multiple Vitamin (MULTIVITAMIN) tablet Take 1 tablet  by mouth daily. Vita Fusion adult Gummie     pantoprazole (PROTONIX) 40 MG tablet Take 40 mg by mouth every morning.     Polyethyl Glycol-Propyl Glycol (SYSTANE) 0.4-0.3 % GEL ophthalmic gel Place 1 application into both eyes at bedtime.     polyethylene glycol powder (GLYCOLAX/MIRALAX) 17 GM/SCOOP powder Take 17 g by mouth daily.     pravastatin (PRAVACHOL) 40 MG tablet TAKE 2 TABLETS(80 MG) BY MOUTH DAILY 180 tablet 3   PREMPRO 0.45-1.5 MG tablet Take 1 tablet by mouth daily.     Probiotic Product (PHILLIPS COLON HEALTH) CAPS Take 1 capsule by mouth at bedtime.     sodium chloride (OCEAN) 0.65 % SOLN nasal spray Place 1 spray into both nostrils 2 (two) times daily.     traZODone (DESYREL) 50 MG  tablet TAKE 1 TABLET AT BEDTIME 90 tablet 3   No current facility-administered medications on file prior to visit.    Allergies  Allergen Reactions   Levofloxacin     IV Levaquin causes burning   Other Itching    Surgical glue    Past Medical History:  Diagnosis Date   Anxiety    Arthritis    Asthma    Cataract    Depression    Diverticulitis    GERD (gastroesophageal reflux disease)    Hodgkin's lymphoma (HCC) 2006   underwent radiation and chemo, last CT scan since moving to [place] from [place] 2018   Hyperlipidemia    Hypothyroidism    Menopausal syndrome    OAB (overactive bladder)    Pneumonia    Seasonal allergies    SVT (supraventricular tachycardia)    Thyroid disease    hypothyroid    Past Surgical History:  Procedure Laterality Date   BLEPHAROPLASTY Bilateral    BREAST BIOPSY     benign   BREAST BIOPSY  07/27/2018   [place] core bx venus COLUMNAR CELL METAPLASIA, AND PSEUDOANGIOMATOUS STROMAL   CARPAL TUNNEL RELEASE Right    CARPAL TUNNEL RELEASE Left    CATARACT EXTRACTION Bilateral 2015   CLEFT LIP REPAIR     COLONOSCOPY WITH PROPOFOL N/A 07/09/2018   Procedure: COLONOSCOPY WITH PROPOFOL;  Surgeon: Christena Deem, MD;  Location: [facility] ENDOSCOPY;  Service: Endoscopy;  Laterality: N/A;   DE QUERVAIN'S RELEASE Bilateral    DIAGNOSTIC LAPAROSCOPY     endometriosis   ESOPHAGOGASTRODUODENOSCOPY N/A 07/09/2018   Procedure: ESOPHAGOGASTRODUODENOSCOPY (EGD);  Surgeon: Christena Deem, MD;  Location[facility] ENDOSCOPY;  Service: Endoscopy;  Laterality: N/A;   FINGER SURGERY Right    Middle finger joint replaced   MAXIMUM ACCESS (MAS) TRANSFORAMINAL LUMBAR INTERBODY FUSION (TLIF) 2 LEVEL Right 07/06/2020   Procedure: L4/5, L5/S1 TRANSFORAMINAL LUMBAR INTERBODY FUSION (TLIF), L4-S1 PEDICLE SCREWS,DECOMPRESSION ;  Surgeon: Lucy Chris, MD;  Location: [facility] ORS;  Service: Neurosurgery;  Laterality: Right;   THUMB SURGERY Right    took tendon from forearm and wrapped it around  the thumb joint    Family History  Problem Relation Age of Onset   Breast cancer Maternal Aunt    Anuerysm Mother    Stroke Mother    Heart attack Father    Alzheimer's disease Father    Developmental delay Brother     Social History   Socioeconomic History   Marital status: Married    Spouse name: Dwight   Number of children: 0   Years of education: Not on file   Highest education level: Master's degree (e.g., MA, MS, MEng, MEd, MSW, MBA)  Occupational History   Occupation: Social work/chaplain    Comment: 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 [employer]  Tobacco Use   Smoking status: Never    Passive exposure: Past   Smokeless tobacco: Never  Vaping Use   Vaping status: Never Used  Substance and Sexual Activity   Alcohol use: Yes    Comment: very occasional once a week   Drug use: No   Sexual activity: Not Currently  Other Topics Concern   Not on file  Social History Narrative   Has living will   Husband is health care POA-- sister Margaret [place] is alternate   Would accept resuscitation attempts   No feeding tube if cognitively unaware   Social Determinants of Health   Financial Resource Strain: Not on file  Food Insecurity: Not on file  Transportation Needs: Not on file  Physical Activity: Not on file  Stress: Not on file  Social Connections: Not on file  Intimate Partner Violence: Not on file   Review of Systems Had trouble with knee and hip--needed ortho visit prior to trip. Had to use walker to get around Eating okay--weight up some Sleep isn't great--disturbed by nocturia    Objective:   Physical Exam Constitutional:      Appearance: Normal appearance.     Comments: Looks washed out but no distress  HENT:     Mouth/Throat:     Comments: Slight pharyngeal injection Cardiovascular:     Rate and Rhythm: Normal rate and regular rhythm.     Heart sounds: No murmur heard.    No gallop.  Pulmonary:     Effort: Pulmonary effort is normal.     Breath sounds: Normal  breath sounds. No wheezing or rales.  Abdominal:     Palpations: Abdomen is soft.     Tenderness: There is no abdominal tenderness.  Musculoskeletal:     Cervical back: Neck supple.     Right lower leg: No edema.     Left lower leg: No edema.  Lymphadenopathy:     Cervical: No cervical adenopathy.  Neurological:     Mental Status: She is alert.            Assessment & Plan:

## 2022-12-14 NOTE — Assessment & Plan Note (Signed)
Chronic issues have probably added to this No change for now--biupropion and fluoxetine

## 2022-12-14 NOTE — Assessment & Plan Note (Signed)
Has had respiratory infection---then international travel--then COVID infection CXR in ER was negative 8/4---nothing to suggest pneumonia Not clearly rebound--as didn't really seem to improve (largely due to side effects from paxlovid) Discussed rest and time to get back to normal No clear post COVID syndrome at this point
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
## 2022-12-29 ENCOUNTER — Encounter: Payer: Self-pay | Admitting: Psychiatry

## 2022-12-29 ENCOUNTER — Ambulatory Visit (INDEPENDENT_AMBULATORY_CARE_PROVIDER_SITE_OTHER): Payer: Medicare Other | Admitting: Psychiatry

## 2022-12-29 VITALS — BP 144/86 | HR 121 | Temp 98.3°F | Ht 63.5 in | Wt 149.6 lb

## 2022-12-29 DIAGNOSIS — F331 Major depressive disorder, recurrent, moderate: Secondary | ICD-10-CM | POA: Diagnosis not present

## 2022-12-29 DIAGNOSIS — F4310 Post-traumatic stress disorder, unspecified: Secondary | ICD-10-CM

## 2022-12-29 DIAGNOSIS — Z79899 Other long term (current) drug therapy: Secondary | ICD-10-CM | POA: Diagnosis not present

## 2022-12-29 MED ORDER — FLUOXETINE HCL 20 MG PO CAPS: 20.0000 mg | ORAL_CAPSULE | Freq: Every day | ORAL | 0 refills | Status: DC

## 2022-12-29 NOTE — Patient Instructions (Signed)
Continue bupropion 200 mg twice a day  Continue fluoxetine 20 mg daily  Continue buspirone 15 mg twice a day Obtain lab (BMP) to monitor hyponatremia Next appointment: 10/24 at 3 30

## 2023-01-05 ENCOUNTER — Other Ambulatory Visit: Payer: Self-pay | Admitting: Psychiatry

## 2023-01-05 ENCOUNTER — Encounter: Payer: Self-pay | Admitting: Psychiatry

## 2023-01-05 LAB — BASIC METABOLIC PANEL
BUN/Creatinine Ratio: 12 (ref 12–28)
BUN: 10 mg/dL (ref 8–27)
CO2: 22 mmol/L (ref 20–29)
Calcium: 8.9 mg/dL (ref 8.7–10.3)
Chloride: 98 mmol/L (ref 96–106)
Creatinine, Ser: 0.85 mg/dL (ref 0.57–1.00)
Glucose: 100 mg/dL — ABNORMAL HIGH (ref 70–99)
Potassium: 3.8 mmol/L (ref 3.5–5.2)
Sodium: 134 mmol/L (ref 134–144)
eGFR: 74 mL/min/{1.73_m2} (ref >59)

## 2023-01-20 ENCOUNTER — Encounter: Payer: Self-pay | Admitting: Adult Health

## 2023-01-20 ENCOUNTER — Ambulatory Visit (INDEPENDENT_AMBULATORY_CARE_PROVIDER_SITE_OTHER): Payer: Medicare Other | Admitting: Adult Health

## 2023-01-20 VITALS — BP 126/64 | HR 90 | Temp 97.9°F | Ht 63.5 in | Wt 150.4 lb

## 2023-01-20 DIAGNOSIS — G4733 Obstructive sleep apnea (adult) (pediatric): Secondary | ICD-10-CM | POA: Insufficient documentation

## 2023-01-20 NOTE — Progress Notes (Signed)
Reviewed and agree with assessment/plan.   Coralyn Helling, MD [facility] Pulmonary/Critical Care 01/20/2023, 11:13 AM Pager:  [phone]

## 2023-01-20 NOTE — Assessment & Plan Note (Signed)
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  Will begin auto CPAP 5 to 12 cm H2O.  Patient education given.  Patient would like to try a smaller mask-suggested she try the DreamWear nasal  Plan  Patient Instructions  Begin CPAP At bedtime, wear all night long for at least 6hr or more  Continue to work on healthy weight Healthy sleep regimen  Do not drive if sleepy  May like the Dreamwear nasal mask.  Follow up in 3 months and As needed

## 2023-01-20 NOTE — Patient Instructions (Signed)
Begin CPAP At bedtime, wear all night long for at least 6hr or more  Continue to work on healthy weight Healthy sleep regimen  Do not drive if sleepy  May like the Dreamwear nasal mask.  Follow up in 3 months and As needed

## 2023-01-20 NOTE — Progress Notes (Signed)
@Patient  ID: Andrea Noble, female    DOB: 1950-07-15, 72 y.o.   MRN: [id number]  Chief Complaint  Patient presents with   Follow-up    Referring provider: Karie Schwalbe, MD  HPI: 72 year old female seen for sleep consult June 06, 2022 for snoring and daytime sleepiness found to have mild obstructive sleep apnea Medical history significant for chronic joint pain and insomnia, cleft lip surgical repair and asthma and allergies   TEST/EVENTS :  home sleep study was completed on July 20, 2022 that showed mild obstructive sleep apnea with AHI at 7.2/hour and SpO2 low at 84%.   01/20/2023 Follow up : OSA Patient returns for a 47-month follow-up.  Patient was seen last visit after recent home sleep study done in March showed mild sleep apnea.  We discussed treatment options including oral appliance and CPAP therapy.  Patient was undergoing dental work and did not feel oral appliance would be a good option for her.  She was going to work on positional sleep and weight loss.  Patient complains that she continues to have ongoing snoring and restless sleep.  Feels tired during the daytime.  Wants to begin CPAP therapy.  Has chronic joint issues with osteoarthritis . Has to use a rolling walker.    Allergies  Allergen Reactions   Levofloxacin     IV Levaquin causes burning   Other Itching    Surgical glue    Immunization History  Administered Date(s) Administered   Fluad Quad(high Dose 65+) 02/04/2022   Influenza,inj,Quad PF,6+ Mos 02/17/2017, 01/04/2018   Influenza-Unspecified 01/18/2018, 01/15/2021   Moderna Sars-Covid-2 Vaccination 05/17/2019, 06/14/2019   PFIZER SARS-COV-2 Pediatric Vaccination 5-21yrs 05/17/2019, 06/14/2019, 03/17/2020   Pfizer Covid-19 Vaccine Bivalent Booster 87yrs & up 01/21/2021   Pneumococcal Polysaccharide-23 04/30/2018   Td 05/03/2011   Zoster Recombinant(Shingrix) 01/03/2020, 03/13/2020    Past Medical History:  Diagnosis Date   Anxiety     Arthritis    Asthma    Cataract    Depression    Diverticulitis    GERD (gastroesophageal reflux disease)    Hodgkin's lymphoma (HCC) 2006   underwent radiation and chemo, last CT scan since moving to [place] from [place] 2018   Hyperlipidemia    Hypothyroidism    Menopausal syndrome    OAB (overactive bladder)    Pneumonia    Seasonal allergies    SVT (supraventricular tachycardia)    Thyroid disease    hypothyroid    Tobacco History: Social History   Tobacco Use  Smoking Status Never   Passive exposure: Past  Smokeless Tobacco Never   Counseling given: Not Answered   Outpatient Medications Prior to Visit  Medication Sig Dispense Refill   acetaminophen (TYLENOL) 650 MG CR tablet Take 1,300 mg by mouth at bedtime.     albuterol (VENTOLIN HFA) 108 (90 Base) MCG/ACT inhaler Inhale 2 puffs into the lungs every 4 (four) hours as needed for wheezing or shortness of breath. 6.7 g 2   azelastine (ASTELIN) 0.1 % nasal spray Place 2 sprays into both nostrils 2 (two) times daily. Use in each nostril as directed     benzonatate (TESSALON PERLES) 100 MG capsule Take 1-2 tabs TID prn cough 30 capsule 0   buPROPion (WELLBUTRIN SR) 200 MG 12 hr tablet TAKE 1 TABLET TWICE A DAY 180 tablet 3   busPIRone (BUSPAR) 15 MG tablet Take 1 tablet (15 mg total) by mouth 2 (two) times daily. 180 tablet 3   Calcium Carbonate-Vit D-Min (CALTRATE  MINIS PLUS MINERALS PO) Take 40 mcg by mouth daily.     Carboxymethylcellulose Sodium (THERATEARS) 0.25 % SOLN Place 1 drop into both eyes daily as needed (Dry eyes).     CRANBERRY PO Take 4,200 mg by mouth daily at 12 noon.     diclofenac Sodium (VOLTAREN) 1 % GEL Apply 1 application topically 2 (two) times daily.     diltiazem (CARDIZEM CD) 120 MG 24 hr capsule Take 120 mg by mouth daily.     docusate sodium (COLACE) 100 MG capsule Take 100 mg by mouth 2 (two) times daily.     fluocinonide cream (LIDEX) 0.05 % Apply 1 application topically daily as needed (Eczema).      FLUoxetine (PROZAC) 20 MG capsule Take 1 capsule (20 mg total) by mouth daily. 90 capsule 0   fluticasone-salmeterol (WIXELA INHUB) 100-50 MCG/ACT AEPB USE 1 INHALATION TWICE A DAY 180 each 3   gabapentin (NEURONTIN) 100 MG capsule TAKE 2 CAPSULES THREE TIMES A DAY 180 capsule 11   guaiFENesin (MUCINEX) 600 MG 12 hr tablet Take 1 tablet (600 mg total) by mouth daily.     levothyroxine (SYNTHROID, LEVOTHROID) 50 MCG tablet Take 50-75 mcg by mouth See admin instructions. Take 75 mg every  Monday, Wednesday and Friday Take 50 mg all the other days     liothyronine (CYTOMEL) 5 MCG tablet Take 2.5 mcg by mouth daily.     meclizine (ANTIVERT) 25 MG tablet Take 25 mg by mouth daily as needed (Dizzyness).     Melatonin 10 MG TABS Take 10 mg by mouth at bedtime. Timed-Release     montelukast (SINGULAIR) 10 MG tablet TAKE 1 TABLET AT BEDTIME 100 tablet 3   Multiple Vitamin (MULTIVITAMIN) tablet Take 1 tablet by mouth daily. Vita Fusion adult Gummie     pantoprazole (PROTONIX) 40 MG tablet Take 40 mg by mouth every morning.     Polyethyl Glycol-Propyl Glycol (SYSTANE) 0.4-0.3 % GEL ophthalmic gel Place 1 application into both eyes at bedtime.     polyethylene glycol powder (GLYCOLAX/MIRALAX) 17 GM/SCOOP powder Take 17 g by mouth daily.     pravastatin (PRAVACHOL) 40 MG tablet TAKE 2 TABLETS(80 MG) BY MOUTH DAILY 180 tablet 3   predniSONE (DELTASONE) 20 MG tablet Take 2 tablets (40 mg total) by mouth daily. For 3 days, then 1 tab daily for 3 days 9 tablet 0   PREMPRO 0.45-1.5 MG tablet Take 1 tablet by mouth daily.     Probiotic Product (PHILLIPS COLON HEALTH) CAPS Take 1 capsule by mouth at bedtime.     sodium chloride (OCEAN) 0.65 % SOLN nasal spray Place 1 spray into both nostrils 2 (two) times daily.     traZODone (DESYREL) 50 MG tablet TAKE 1 TABLET AT BEDTIME 90 tablet 3   No facility-administered medications prior to visit.     Review of Systems:   Constitutional:   No  weight loss, night  sweats,  Fevers, chills, fatigue, or  lassitude.  HEENT:   No headaches,  Difficulty swallowing,  Tooth/dental problems, or  Sore throat,                No sneezing, itching, ear ache, nasal congestion, post nasal drip,   CV:  No chest pain,  Orthopnea, PND, swelling in lower extremities, anasarca, dizziness, palpitations, syncope.   GI  No heartburn, indigestion, abdominal pain, nausea, vomiting, diarrhea, change in bowel habits, loss of appetite, bloody stools.   Resp: No shortness of breath with  exertion or at rest.  No excess mucus, no productive cough,  No non-productive cough,  No coughing up of blood.  No change in color of mucus.  No wheezing.  No chest wall deformity  Skin: no rash or lesions.  GU: no dysuria, change in color of urine, no urgency or frequency.  No flank pain, no hematuria   MS:  No joint pain or swelling.  No decreased range of motion.  No back pain.    Physical Exam  BP 126/64 (BP Location: Left Arm, Patient Position: Sitting, Cuff Size: Normal)   Pulse 90   Temp 97.9 F (36.6 C) (Oral)   Ht 5' 3.5" (1.613 m)   Wt 150 lb 6.4 oz (68.2 kg)   SpO2 100%   BMI 26.22 kg/m   GEN: A/Ox3; pleasant , NAD, well nourished    HEENT:  [place]/AT,   NOSE-clear, THROAT-clear, no lesions, no postnasal drip or exudate noted. Class 2-3 MP airway   NECK:  Supple w/ fair ROM; no JVD; normal carotid impulses w/o bruits; no thyromegaly or nodules palpated; no lymphadenopathy.    RESP  Clear  P & A; w/o, wheezes/ rales/ or rhonchi. no accessory muscle use, no dullness to percussion  CARD:  RRR, no m/r/g, no peripheral edema, pulses intact, no cyanosis or clubbing.  GI:   Soft & nt; nml bowel sounds; no organomegaly or masses detected.   Musco: Warm bil, no deformities or joint swelling noted.   Neuro: alert, no focal deficits noted.    Skin: Warm, no lesions or rashes    Lab Results:    BNP No results found for: "BNP"  ProBNP No results found for:  "PROBNP"  Imaging: No results found.  Administration History     None           No data to display          No results found for: "NITRICOXIDE"      Assessment & Plan:   OSA (obstructive sleep apnea) Mild obstructive sleep apnea with significant symptom burden.  Will begin auto CPAP 5 to 12 cm H2O.  Patient education given.  Patient would like to try a smaller mask-suggested she try the DreamWear nasal  Plan  Patient Instructions  Begin CPAP At bedtime, wear all night long for at least 6hr or more  Continue to work on healthy weight Healthy sleep regimen  Do not drive if sleepy  May like the Dreamwear nasal mask.  Follow up in 3 months and As needed        Rubye Oaks, NP 01/20/2023

## 2023-01-30 ENCOUNTER — Telehealth: Payer: Self-pay | Admitting: Internal Medicine

## 2023-01-30 ENCOUNTER — Other Ambulatory Visit: Payer: Self-pay | Admitting: Internal Medicine

## 2023-01-30 MED ORDER — PANTOPRAZOLE SODIUM 40 MG PO TBEC: 40.0000 mg | DELAYED_RELEASE_TABLET | ORAL | 3 refills | Status: DC

## 2023-01-30 NOTE — Telephone Encounter (Signed)
Prescription Request  01/30/2023  LOV: 12/14/2022  What is the name of the medication or equipment?  pantoprazole (PROTONIX) 40 MG tablet  Have you contacted your pharmacy to request a refill? Yes   Which pharmacy would you like this sent to?   [id number] - [place], [place] - [address] AT [facility] [address] [facility] [address] [place] [address] Phone: [phone] Fax: [phone]    Patient notified that their request is being sent to the clinical staff for review and that they should receive a response within 2 business days.   Please advise at [facility] [phone]

## 2023-01-30 NOTE — Telephone Encounter (Signed)
Rx sent electronically.  

## 2023-02-01 ENCOUNTER — Telehealth: Payer: Self-pay | Admitting: Adult Health

## 2023-02-01 NOTE — Telephone Encounter (Signed)
Order placed to [facility] on 01/20/2023.  Synetta Fail, can you assist with this?

## 2023-02-01 NOTE — Telephone Encounter (Signed)
Patient is calling because [facility] needs a new prescription in order for her to get a CPAP machine. Please fax to [phone]

## 2023-02-01 NOTE — Telephone Encounter (Signed)
I sent urgent message to [facility] for them to check on this issue

## 2023-02-06 ENCOUNTER — Ambulatory Visit (INDEPENDENT_AMBULATORY_CARE_PROVIDER_SITE_OTHER): Payer: Medicare Other | Admitting: Internal Medicine

## 2023-02-06 ENCOUNTER — Encounter: Payer: Self-pay | Admitting: Internal Medicine

## 2023-02-06 VITALS — BP 130/70 | HR 110 | Temp 98.4°F | Ht 63.5 in | Wt 150.0 lb

## 2023-02-06 DIAGNOSIS — Z1159 Encounter for screening for other viral diseases: Secondary | ICD-10-CM

## 2023-02-06 DIAGNOSIS — E039 Hypothyroidism, unspecified: Secondary | ICD-10-CM

## 2023-02-06 DIAGNOSIS — I471 Supraventricular tachycardia, unspecified: Secondary | ICD-10-CM | POA: Diagnosis not present

## 2023-02-06 DIAGNOSIS — F39 Unspecified mood [affective] disorder: Secondary | ICD-10-CM

## 2023-02-06 DIAGNOSIS — I700 Atherosclerosis of aorta: Secondary | ICD-10-CM

## 2023-02-06 DIAGNOSIS — Z0000 Encounter for general adult medical examination without abnormal findings: Secondary | ICD-10-CM

## 2023-02-06 MED ORDER — GABAPENTIN 100 MG PO CAPS: 100.0000 mg | ORAL_CAPSULE | Freq: Every day | ORAL | 0 refills | Status: DC

## 2023-02-06 NOTE — Assessment & Plan Note (Signed)
No symptoms on the diltiazem 120 daily

## 2023-02-06 NOTE — Assessment & Plan Note (Signed)
On levothyroxine 50-75 daily and liothyronine 2.5 mcg daily

## 2023-02-06 NOTE — Assessment & Plan Note (Signed)
I have personally reviewed the Medicare Annual Wellness questionnaire and have noted 1. The patient's medical and social history 2. Their use of alcohol, tobacco or illicit drugs 3. Their current medications and supplements 4. The patient's functional ability including ADL's, fall risks, home safety risks and hearing or visual             impairment. 5. Diet and physical activities 6. Evidence for depression or mood disorders  The patients weight, height, BMI and visual acuity have been recorded in the chart I have made referrals, counseling and provided education to the patient based review of the above and I have provided the pt with a written personalized care plan for preventive services.  I have provided you with a copy of your personalized plan for preventive services. Please take the time to review along with your updated medication list.  Colon due next year Yearly mammogram till at least 77 No pap due to age Does exercise---discussed adding resistance work Flu vaccine soon at [employer] had COVID Needs Td at pharmacy

## 2023-02-06 NOTE — Progress Notes (Signed)
Subjective:    Patient ID: Andrea Noble, female    DOB: 12-18-1950, 72 y.o.   MRN: [id number]  HPI Here for Medicare wellness visit and follow up of chronic health conditions Reviewed advanced directives Reviewed other doctors---Ms Parrett--sleep/pulmonary, Dr Patel--rheumatology, Dr Hisada--psychiatry, DR Russo--GI, Dr Lissa Hoard, Dr Garnette Scheuermann, Dr Agrawal--oncology, Dr Mikey Bussing, Dr Donata Duff, Dr Kowalski--cardiology, , Dr O'Connell--endocrinology No surgery or hospitalizations in the past year Rare alcohol No tobacco Vision is okay--new glasses (progressive) Hearing is not great--plans to get it checked Tries to walk regularly Larey Seat once--not sure if it was this year. No injury Independent with instrumental ADLs Mild memory issues  Getting frustrated with multiple physical issues Right shoulder, knee, hip arthritis Hasn't done the shoulder surgery--acts up at times [facility] with rollator---does get out and walk (uses rollator if husband not there) Just given celebrex--only started it yesterday  Urine smells bad--but no pain  Has urgency---takes vesicare every other day  No heart trouble No palpitations  No chest pain  Gets some SOB if walking out in the heat--relates it to heat exhaustion No edema No syncope. Some dizziness--or more like a balance issue---the rollator helps  Known aortic atherosclerosis  Doesn't feel depressed Not sure the fluoxetine helps Feels tired--fibromyalgia. Has to sit if she is up too long On bupropion and buspirone Not anhedonic Likes to walk--sings in choir at church, social events at [employer]  Current Outpatient Medications on File Prior to Visit  Medication Sig Dispense Refill   acetaminophen (TYLENOL) 650 MG CR tablet Take 1,300 mg by mouth at bedtime.     albuterol (VENTOLIN HFA) 108 (90 Base) MCG/ACT inhaler Inhale 2 puffs into the lungs every 4 (four) hours as needed for wheezing or shortness of breath. 6.7 g 2    azelastine (ASTELIN) 0.1 % nasal spray Place 2 sprays into both nostrils 2 (two) times daily. Use in each nostril as directed     buPROPion (WELLBUTRIN SR) 200 MG 12 hr tablet TAKE 1 TABLET TWICE A DAY 180 tablet 3   busPIRone (BUSPAR) 15 MG tablet Take 1 tablet (15 mg total) by mouth 2 (two) times daily. 180 tablet 3   Calcium Carbonate-Vit D-Min (CALTRATE MINIS PLUS MINERALS PO) Take 40 mcg by mouth daily.     Carboxymethylcellulose Sodium (THERATEARS) 0.25 % SOLN Place 1 drop into both eyes daily as needed (Dry eyes).     celecoxib (CELEBREX) 200 MG capsule Take 200 mg by mouth daily.     CRANBERRY PO Take 4,200 mg by mouth daily at 12 noon.     diclofenac Sodium (VOLTAREN) 1 % GEL Apply 1 application topically 2 (two) times daily.     diltiazem (CARDIZEM CD) 120 MG 24 hr capsule Take 120 mg by mouth daily.     docusate sodium (COLACE) 100 MG capsule Take 100 mg by mouth 2 (two) times daily.     fluocinonide cream (LIDEX) 0.05 % Apply 1 application topically daily as needed (Eczema).     FLUoxetine (PROZAC) 20 MG capsule Take 1 capsule (20 mg total) by mouth daily. 90 capsule 0   fluticasone-salmeterol (WIXELA INHUB) 100-50 MCG/ACT AEPB USE 1 INHALATION TWICE A DAY 180 each 0   gabapentin (NEURONTIN) 100 MG capsule TAKE 2 CAPSULES THREE TIMES A DAY 180 capsule 11   levothyroxine (SYNTHROID, LEVOTHROID) 50 MCG tablet Take 50-75 mcg by mouth See admin instructions. Take 75 mg every  Monday, Wednesday and Friday Take 50 mg all the other days  liothyronine (CYTOMEL) 5 MCG tablet Take 2.5 mcg by mouth daily.     Melatonin 10 MG TABS Take 10 mg by mouth at bedtime. Timed-Release     montelukast (SINGULAIR) 10 MG tablet TAKE 1 TABLET AT BEDTIME 100 tablet 3   Multiple Vitamin (MULTIVITAMIN) tablet Take 1 tablet by mouth daily. Vita Fusion adult Gummie     pantoprazole (PROTONIX) 40 MG tablet Take 1 tablet (40 mg total) by mouth every morning. Take 40 mg by mouth every morning. 90 tablet 3    Polyethyl Glycol-Propyl Glycol (SYSTANE) 0.4-0.3 % GEL ophthalmic gel Place 1 application into both eyes at bedtime.     polyethylene glycol powder (GLYCOLAX/MIRALAX) 17 GM/SCOOP powder Take 17 g by mouth daily.     pravastatin (PRAVACHOL) 40 MG tablet TAKE 2 TABLETS(80 MG) BY MOUTH DAILY 180 tablet 3   PREMPRO 0.45-1.5 MG tablet Take 1 tablet by mouth daily.     Probiotic Product (PHILLIPS COLON HEALTH) CAPS Take 1 capsule by mouth at bedtime.     sodium chloride (OCEAN) 0.65 % SOLN nasal spray Place 1 spray into both nostrils 2 (two) times daily.     traZODone (DESYREL) 50 MG tablet TAKE 1 TABLET AT BEDTIME 90 tablet 3   No current facility-administered medications on file prior to visit.    Allergies  Allergen Reactions   Levofloxacin     IV Levaquin causes burning   Other Itching    Surgical glue    Past Medical History:  Diagnosis Date   Anxiety    Arthritis    Asthma    Cataract    Depression    Diverticulitis    GERD (gastroesophageal reflux disease)    Hodgkin's lymphoma (HCC) 2006   underwent radiation and chemo, last CT scan since moving to  from [place] 2018   Hyperlipidemia    Hypothyroidism    Menopausal syndrome    OAB (overactive bladder)    Pneumonia    Seasonal allergies    SVT (supraventricular tachycardia) (HCC)    Thyroid disease    hypothyroid    Past Surgical History:  Procedure Laterality Date   BLEPHAROPLASTY Bilateral    BREAST BIOPSY     benign   BREAST BIOPSY  07/27/2018   [place] core bx venus COLUMNAR CELL METAPLASIA, AND PSEUDOANGIOMATOUS STROMAL   CARPAL TUNNEL RELEASE Right    CARPAL TUNNEL RELEASE Left    CATARACT EXTRACTION Bilateral 2015   CLEFT LIP REPAIR     COLONOSCOPY WITH PROPOFOL N/A 07/09/2018   Procedure: COLONOSCOPY WITH PROPOFOL;  Surgeon: Christena Deem, MD;  Location: [facility] ENDOSCOPY;  Service: Endoscopy;  Laterality: N/A;   DE QUERVAIN'S RELEASE Bilateral    DIAGNOSTIC LAPAROSCOPY     endometriosis    ESOPHAGOGASTRODUODENOSCOPY N/A 07/09/2018   Procedure: ESOPHAGOGASTRODUODENOSCOPY (EGD);  Surgeon: Christena Deem, MD;  Location: [facility] ENDOSCOPY;  Service: Endoscopy;  Laterality: N/A;   FINGER SURGERY Right    Middle finger joint replaced   MAXIMUM ACCESS (MAS) TRANSFORAMINAL LUMBAR INTERBODY FUSION (TLIF) 2 LEVEL Right 07/06/2020   Procedure: L4/5, L5/S1 TRANSFORAMINAL LUMBAR INTERBODY FUSION (TLIF), L4-S1 PEDICLE SCREWS,DECOMPRESSION ;  Surgeon: Lucy Chris, MD;  Location: [facility] ORS;  Service: Neurosurgery;  Laterality: Right;   THUMB SURGERY Right    took tendon from forearm and wrapped it around the thumb joint    Family History  Problem Relation Age of Onset   Breast cancer Maternal Aunt    Anuerysm Mother    Stroke Mother  Heart attack Father    Alzheimer's disease Father    Developmental delay Brother     Social History   Socioeconomic History   Marital status: Married    Spouse name: Dwight   Number of children: 0   Years of education: Not on file   Highest education level: Master's degree (e.g., MA, MS, MEng, MEd, MSW, MBA)  Occupational History   Occupation: Social work/chaplain    Comment: 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 [employer]  Tobacco Use   Smoking status: Never    Passive exposure: Past   Smokeless tobacco: Never  Vaping Use   Vaping status: Never Used  Substance and Sexual Activity   Alcohol use: Yes    Comment: very occasional once a week   Drug use: No   Sexual activity: Not Currently  Other Topics Concern   Not on file  Social History Narrative   Has living will   Husband is health care POA-- sister Margaret [place] is alternate   Would accept resuscitation attempts   No feeding tube if cognitively unaware   Social Determinants of Health   Financial Resource Strain: Not on file  Food Insecurity: Not on file  Transportation Needs: Not on file  Physical Activity: Not on file  Stress: Not on file  Social Connections: Not on file  Intimate Partner Violence:  Not on file   Review of Systems Appetite is fine Weight is up some Sleeps okay Wears seat belt Teeth --got posts for implants but hasn't had the teeth put on. Also has crowns that are not far enough down. May be getting new dentist Some heartburn---takes simethicone with some relief. Does wake with throat symptoms. Does take the pantoprazole. No sig dysphagia Bowels move okay--with low dose miralax Betting easy bruising--especially on arms    Objective:   Physical Exam Constitutional:      Appearance: Normal appearance.  HENT:     Mouth/Throat:     Pharynx: No oropharyngeal exudate or posterior oropharyngeal erythema.  Eyes:     Conjunctiva/sclera: Conjunctivae normal.     Pupils: Pupils are equal, round, and reactive to light.  Cardiovascular:     Rate and Rhythm: Normal rate and regular rhythm.     Pulses: Normal pulses.     Heart sounds: No murmur heard.    No gallop.  Pulmonary:     Effort: Pulmonary effort is normal.     Breath sounds: Normal breath sounds. No wheezing or rales.  Abdominal:     Palpations: Abdomen is soft.     Tenderness: There is no abdominal tenderness.  Musculoskeletal:     Cervical back: Neck supple.     Right lower leg: No edema.     Left lower leg: No edema.  Lymphadenopathy:     Cervical: No cervical adenopathy.  Skin:    Findings: No rash.  Neurological:     General: No focal deficit present.     Mental Status: She is alert and oriented to person, place, and time.     Comments: Word naming---7/1 minute Recall 3/3  Psychiatric:        Mood and Affect: Mood normal.        Behavior: Behavior normal.            Assessment & Plan:

## 2023-02-06 NOTE — Assessment & Plan Note (Signed)
On imaging Is on the statin 

## 2023-02-06 NOTE — Progress Notes (Signed)
Hearing Screening - Comments:: Passed whisper test Vision Screening - Comments:: September 2024

## 2023-02-06 NOTE — Assessment & Plan Note (Signed)
Doesn't feel she has MDD Fluoxetine, bupropion and buspirone--mood okay

## 2023-02-07 LAB — HEPATITIS C ANTIBODY: Hepatitis C Ab: NONREACTIVE

## 2023-02-07 LAB — T3: T3, Total: 82 ng/dL (ref 76–181)

## 2023-02-07 LAB — RENAL FUNCTION PANEL
Albumin: 4.3 g/dL (ref 3.5–5.2)
BUN: 13 mg/dL (ref 6–23)
CO2: 26 meq/L (ref 19–32)
Calcium: 9.2 mg/dL (ref 8.4–10.5)
Chloride: 97 meq/L (ref 96–112)
Creatinine, Ser: 0.91 mg/dL (ref 0.40–1.20)
GFR: 64.12 mL/min (ref >60.00)
Glucose, Bld: 92 mg/dL (ref 70–99)
Phosphorus: 3.1 mg/dL (ref 2.3–4.6)
Potassium: 4 meq/L (ref 3.5–5.1)
Sodium: 131 meq/L — ABNORMAL LOW (ref 135–145)

## 2023-02-07 LAB — LIPID PANEL
Cholesterol: 184 mg/dL (ref 0–200)
HDL: 95.1 mg/dL (ref >39.00)
LDL Cholesterol: 77 mg/dL (ref 0–99)
NonHDL: 88.8
Total CHOL/HDL Ratio: 2
Triglycerides: 58 mg/dL (ref 0.0–149.0)
VLDL: 11.6 mg/dL (ref 0.0–40.0)

## 2023-02-07 LAB — CBC
HCT: 37.9 % (ref 36.0–46.0)
Hemoglobin: 12.3 g/dL (ref 12.0–15.0)
MCHC: 32.5 g/dL (ref 30.0–36.0)
MCV: 93.1 fL (ref 78.0–100.0)
Platelets: 224 10*3/uL (ref 150.0–400.0)
RBC: 4.07 Mil/uL (ref 3.87–5.11)
RDW: 15.3 % (ref 11.5–15.5)
WBC: 7.9 10*3/uL (ref 4.0–10.5)

## 2023-02-07 LAB — HEPATIC FUNCTION PANEL
ALT: 20 U/L (ref 0–35)
AST: 20 U/L (ref 0–37)
Albumin: 4.3 g/dL (ref 3.5–5.2)
Alkaline Phosphatase: 43 U/L (ref 39–117)
Bilirubin, Direct: 0.1 mg/dL (ref 0.0–0.3)
Total Bilirubin: 0.4 mg/dL (ref 0.2–1.2)
Total Protein: 7 g/dL (ref 6.0–8.3)

## 2023-02-07 LAB — T4, FREE: Free T4: 0.81 ng/dL (ref 0.60–1.60)

## 2023-02-07 LAB — TSH: TSH: 1.59 u[IU]/mL (ref 0.35–5.50)

## 2023-02-07 NOTE — Telephone Encounter (Signed)
I received confirmation again for the 2nd time that Brad with [facility] had this order 5 days ago

## 2023-02-17 ENCOUNTER — Other Ambulatory Visit: Payer: Self-pay | Admitting: Internal Medicine
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
## 2023-02-23 ENCOUNTER — Telehealth (INDEPENDENT_AMBULATORY_CARE_PROVIDER_SITE_OTHER): Payer: Medicare Other | Admitting: Psychiatry

## 2023-02-23 ENCOUNTER — Encounter: Payer: Self-pay | Admitting: Psychiatry

## 2023-02-23 DIAGNOSIS — G4700 Insomnia, unspecified: Secondary | ICD-10-CM

## 2023-02-23 DIAGNOSIS — F4310 Post-traumatic stress disorder, unspecified: Secondary | ICD-10-CM | POA: Diagnosis not present

## 2023-02-23 DIAGNOSIS — F3341 Major depressive disorder, recurrent, in partial remission: Secondary | ICD-10-CM

## 2023-02-23 MED ORDER — FLUOXETINE HCL 20 MG PO CAPS: 20.0000 mg | ORAL_CAPSULE | Freq: Every day | ORAL | 0 refills | Status: DC

## 2023-02-23 NOTE — Patient Instructions (Addendum)
Continue bupropion 200 mg twice a day Continue fluoxetine 20 mg daily  Continue buspirone 15 mg twice a day Next appointment: 12/30 at 10 am Referral to therapy

## 2023-02-27 ENCOUNTER — Telehealth: Payer: Self-pay | Admitting: Internal Medicine

## 2023-02-27 MED ORDER — PREMPRO 0.45-1.5 MG PO TABS: 1.0000 | ORAL_TABLET | Freq: Every day | ORAL | 0 refills | Status: DC

## 2023-02-27 NOTE — Telephone Encounter (Signed)
Prescription Request  02/27/2023  LOV: 02/06/2023  What is the name of the medication or equipment? PREMPRO 0.45-1.5 MG tablet   Have you contacted your pharmacy to request a refill? Yes   Which pharmacy would you like this sent to?  [facility] - [place], [place] - [address] [place] [address] Phone: [phone] Fax: [phone]    Patient notified that their request is being sent to the clinical staff for review and that they should receive a response within 2 business days.   Please advise at Mobile [phone] (mobile)

## 2023-02-27 NOTE — Telephone Encounter (Signed)
Results mailed to patient;.

## 2023-03-10 ENCOUNTER — Other Ambulatory Visit: Payer: Self-pay | Admitting: Internal Medicine

## 2023-03-15 ENCOUNTER — Other Ambulatory Visit: Payer: Self-pay | Admitting: Internal Medicine

## 2023-03-15 DIAGNOSIS — Z1231 Encounter for screening mammogram for malignant neoplasm of breast: Secondary | ICD-10-CM

## 2023-03-27 ENCOUNTER — Other Ambulatory Visit: Payer: Self-pay | Admitting: Internal Medicine

## 2023-03-27 ENCOUNTER — Other Ambulatory Visit: Payer: Self-pay | Admitting: Psychiatry

## 2023-04-03 ENCOUNTER — Other Ambulatory Visit: Payer: Self-pay | Admitting: Internal Medicine

## 2023-04-11 ENCOUNTER — Telehealth: Payer: Self-pay

## 2023-04-11 ENCOUNTER — Ambulatory Visit (INDEPENDENT_AMBULATORY_CARE_PROVIDER_SITE_OTHER): Payer: Medicare Other | Admitting: Adult Health

## 2023-04-11 ENCOUNTER — Encounter: Payer: Self-pay | Admitting: Adult Health

## 2023-04-11 VITALS — BP 136/66 | HR 99 | Temp 97.6°F | Ht 63.5 in | Wt 151.2 lb

## 2023-04-11 DIAGNOSIS — G4733 Obstructive sleep apnea (adult) (pediatric): Secondary | ICD-10-CM | POA: Diagnosis not present

## 2023-04-11 NOTE — Assessment & Plan Note (Signed)
Excellent control compliance on nocturnal CPAP.  Continue on CPAP.  Will adjust CPAP pressure to 5 to 12 cm H2O.  Plan  Patient Instructions  CPAP At bedtime, wear all night long for at least 6hr or more  Keep up good work.  CPAP pressure setting -5 to 12cmH2O.  Continue to work on healthy weight Healthy sleep regimen  Do not drive if sleepy  Follow up in 6 months and As needed

## 2023-04-11 NOTE — Patient Instructions (Addendum)
CPAP At bedtime, wear all night long for at least 6hr or more  Keep up good work.  CPAP pressure setting -5 to 12cmH2O.  Continue to work on healthy weight Healthy sleep regimen  Do not drive if sleepy  Follow up in 6 months and As needed

## 2023-04-11 NOTE — Telephone Encounter (Signed)
Order placed 01/20/23 for cpap 5-12cm. It does not appear that settings were changed. [facility] is still showing settings of 5-20cm. I spoke to Caban with [employer]. He stated that he would send a message to the cpap team to have them adjust the settings.  Routing to TP as an [place]

## 2023-04-11 NOTE — Progress Notes (Signed)
@Patient  ID: Andrea Noble, female    DOB: [birth year], 72 y.o.   MRN: [id number]  Chief Complaint  Patient presents with   Follow-up    Referring provider: Karie Schwalbe, MD  HPI: 72 year old female seen for sleep consult June 06, 2022 for snoring found to have mild obstructive sleep apnea Medical history significant for chronic joint pain and insomnia, cleft lip surgical repair, asthma and allergies  TEST/EVENTS :  home sleep study was completed on July 20, 2022 that showed mild obstructive sleep apnea with AHI at 7.2/hour and SpO2 low at 84%.   04/11/2023 Follow up : OSA Patient presents for a 8-month follow-up.  Patient was seen last visit after recent home sleep study showed mild sleep apnea.  Patient was started on CPAP therapy Since last visit patient says she is doing well.  Trying to wear her CPAP every single night.  Feels that she is benefiting from CPAP with decreased daytime sleepiness.  CPAP download shows excellent compliance with daily average usage at 7.5 hours.  Patient is on auto CPAP 5 to 20 cm H2O.  AHI 1.8/hour.  Daily average pressure 11.6 cm H2O. Using a nasal mask.     Allergies  Allergen Reactions   Levofloxacin     IV Levaquin causes burning   Other Itching    Surgical glue    Immunization History  Administered Date(s) Administered   Fluad Quad(high Dose 65+) 02/04/2022   Influenza, High Dose Seasonal PF 12/27/2018   Influenza,inj,Quad PF,6+ Mos 02/17/2017, 01/04/2018   Influenza-Unspecified 01/18/2018, 12/27/2018, 01/03/2020, 01/15/2021   Moderna Sars-Covid-2 Vaccination 05/17/2019, 06/14/2019   PFIZER SARS-COV-2 Pediatric Vaccination 5-65yrs 05/17/2019, 06/14/2019, 03/17/2020   Pfizer Covid-19 Vaccine Bivalent Booster 25yrs & up 01/21/2021   Pneumococcal Polysaccharide-23 04/30/2018   Td 05/03/2011   Zoster Recombinant(Shingrix) 01/03/2020, 03/13/2020    Past Medical History:  Diagnosis Date   Anxiety    Arthritis    Asthma     Cataract    Depression    Diverticulitis    GERD (gastroesophageal reflux disease)    Hodgkin's lymphoma (HCC) 2006   underwent radiation and chemo, last CT scan since moving to [place] from [place] 2018   Hyperlipidemia    Hypothyroidism    Menopausal syndrome    OAB (overactive bladder)    Pneumonia    Seasonal allergies    SVT (supraventricular tachycardia) (HCC)    Thyroid disease    hypothyroid    Tobacco History: Social History   Tobacco Use  Smoking Status Never   Passive exposure: Past  Smokeless Tobacco Never   Counseling given: Not Answered   Outpatient Medications Prior to Visit  Medication Sig Dispense Refill   acetaminophen (TYLENOL) 650 MG CR tablet Take 1,300 mg by mouth at bedtime.     albuterol (VENTOLIN HFA) 108 (90 Base) MCG/ACT inhaler Inhale 2 puffs into the lungs every 4 (four) hours as needed for wheezing or shortness of breath. 6.7 g 2   azelastine (ASTELIN) 0.1 % nasal spray USE 2 SPRAYS IN EACH NOSTRIL TWICE A DAY AS DIRECTED 90 mL 3   buPROPion (WELLBUTRIN SR) 200 MG 12 hr tablet TAKE 1 TABLET TWICE A DAY 180 tablet 3   busPIRone (BUSPAR) 15 MG tablet TAKE 1 TABLET TWICE A DAY 180 tablet 3   Calcium Carbonate-Vit D-Min (CALTRATE MINIS PLUS MINERALS PO) Take 40 mcg by mouth daily.     Carboxymethylcellulose Sodium (THERATEARS) 0.25 % SOLN Place 1 drop into both eyes daily as needed (  Dry eyes).     celecoxib (CELEBREX) 200 MG capsule Take 200 mg by mouth daily.     CRANBERRY PO Take 4,200 mg by mouth daily at 12 noon.     diclofenac Sodium (VOLTAREN) 1 % GEL Apply 1 application topically 2 (two) times daily.     diltiazem (CARDIZEM CD) 120 MG 24 hr capsule Take 120 mg by mouth daily.     docusate sodium (COLACE) 100 MG capsule Take 100 mg by mouth 2 (two) times daily.     fluocinonide cream (LIDEX) 0.05 % Apply 1 application topically daily as needed (Eczema).     FLUoxetine (PROZAC) 20 MG capsule Take 1 capsule (20 mg total) by mouth daily. 90 capsule 0    fluticasone-salmeterol (WIXELA INHUB) 100-50 MCG/ACT AEPB USE 1 INHALATION TWICE A DAY 180 each 0   gabapentin (NEURONTIN) 100 MG capsule Take 1 capsule (100 mg total) by mouth at bedtime. 1 capsule 0   levothyroxine (SYNTHROID, LEVOTHROID) 50 MCG tablet Take 50-75 mcg by mouth See admin instructions. Take 75 mg every  Monday, Wednesday and Friday Take 50 mg all the other days     liothyronine (CYTOMEL) 5 MCG tablet Take 2.5 mcg by mouth daily.     Melatonin 10 MG TABS Take 10 mg by mouth at bedtime. Timed-Release     montelukast (SINGULAIR) 10 MG tablet TAKE 1 TABLET AT BEDTIME 100 tablet 3   Multiple Vitamin (MULTIVITAMIN) tablet Take 1 tablet by mouth daily. Vita Fusion adult Gummie     pantoprazole (PROTONIX) 40 MG tablet Take 1 tablet (40 mg total) by mouth every morning. Take 40 mg by mouth every morning. 90 tablet 3   Polyethyl Glycol-Propyl Glycol (SYSTANE) 0.4-0.3 % GEL ophthalmic gel Place 1 application into both eyes at bedtime.     polyethylene glycol powder (GLYCOLAX/MIRALAX) 17 GM/SCOOP powder Take 17 g by mouth daily.     pravastatin (PRAVACHOL) 40 MG tablet TAKE 2 TABLETS(80 MG) BY MOUTH DAILY 180 tablet 3   PREMPRO 0.45-1.5 MG tablet Take 1 tablet by mouth daily. 90 tablet 0   Probiotic Product (PHILLIPS COLON HEALTH) CAPS Take 1 capsule by mouth at bedtime.     sodium chloride (OCEAN) 0.65 % SOLN nasal spray Place 1 spray into both nostrils 2 (two) times daily.     traZODone (DESYREL) 50 MG tablet TAKE 1 TABLET AT BEDTIME 90 tablet 3   No facility-administered medications prior to visit.     Review of Systems:   Constitutional:   No  weight loss, night sweats,  Fevers, chills, fatigue, or  lassitude.  HEENT:   No headaches,  Difficulty swallowing,  Tooth/dental problems, or  Sore throat,                No sneezing, itching, ear ache, nasal congestion, post nasal drip,   CV:  No chest pain,  Orthopnea, PND, swelling in lower extremities, anasarca, dizziness, palpitations,  syncope.   GI  No heartburn, indigestion, abdominal pain, nausea, vomiting, diarrhea, change in bowel habits, loss of appetite, bloody stools.   Resp: No shortness of breath with exertion or at rest.  No excess mucus, no productive cough,  No non-productive cough,  No coughing up of blood.  No change in color of mucus.  No wheezing.  No chest wall deformity  Skin: no rash or lesions.  GU: no dysuria, change in color of urine, no urgency or frequency.  No flank pain, no hematuria   MS:  No  joint pain or swelling.  No decreased range of motion.  No back pain.    Physical Exam  BP 136/66 (BP Location: Left Arm, Patient Position: Sitting, Cuff Size: Normal)   Pulse 99   Temp 97.6 F (36.4 C) (Temporal)   Ht 5' 3.5" (1.613 m)   Wt 151 lb 3.2 oz (68.6 kg)   SpO2 100%   BMI 26.36 kg/m   GEN: A/Ox3; pleasant , NAD, well nourished    HEENT:  [facility]/AT,  EACs-clear, TMs-wnl, NOSE-clear, THROAT-clear, no lesions, no postnasal drip or exudate noted.   NECK:  Supple w/ fair ROM; no JVD; normal carotid impulses w/o bruits; no thyromegaly or nodules palpated; no lymphadenopathy.    RESP  Clear  P & A; w/o, wheezes/ rales/ or rhonchi. no accessory muscle use, no dullness to percussion  CARD:  RRR, no m/r/g, no peripheral edema, pulses intact, no cyanosis or clubbing.  GI:   Soft & nt; nml bowel sounds; no organomegaly or masses detected.   Musco: Warm bil, no deformities or joint swelling noted.   Neuro: alert, no focal deficits noted.    Skin: Warm, no lesions or rashes    Lab Results:  CBC    Component Value Date/Time   WBC 7.9 02/06/2023 1500   RBC 4.07 02/06/2023 1500   HGB 12.3 02/06/2023 1500   HCT 37.9 02/06/2023 1500   PLT 224.0 02/06/2023 1500   MCV 93.1 02/06/2023 1500   MCH 30.6 08/11/2022 1503   MCHC 32.5 02/06/2023 1500   RDW 15.3 02/06/2023 1500   LYMPHSABS 1.5 03/06/2017 1546   MONOABS 0.6 03/06/2017 1546   EOSABS 0.1 03/06/2017 1546   BASOSABS 0.0 03/06/2017  1546    BMET    Component Value Date/Time   NA 131 (L) 02/06/2023 1500   NA 134 01/04/2023 1602   K 4.0 02/06/2023 1500   CL 97 02/06/2023 1500   CO2 26 02/06/2023 1500   GLUCOSE 92 02/06/2023 1500   BUN 13 02/06/2023 1500   BUN 10 01/04/2023 1602   CREATININE 0.91 02/06/2023 1500   CALCIUM 9.2 02/06/2023 1500   GFRNONAA >60 08/11/2022 1503   GFRAA >60 06/23/2018 1613    BNP No results found for: "BNP"  ProBNP No results found for: "PROBNP"  Imaging: No results found.  Administration History     None           No data to display          No results found for: "NITRICOXIDE"      Assessment & Plan:   OSA (obstructive sleep apnea) Excellent control compliance on nocturnal CPAP.  Continue on CPAP.  Will adjust CPAP pressure to 5 to 12 cm H2O.  Plan  Patient Instructions  CPAP At bedtime, wear all night long for at least 6hr or more  Keep up good work.  CPAP pressure setting -5 to 12cmH2O.  Continue to work on healthy weight Healthy sleep regimen  Do not drive if sleepy  Follow up in 6 months and As needed         Rubye Oaks, NP 04/11/2023

## 2023-04-13 ENCOUNTER — Ambulatory Visit
Admission: RE | Admit: 2023-04-13 | Discharge: 2023-04-13 | Disposition: A | Discharge status: Home / Self Care | Payer: Medicare Other | Source: Ambulatory Visit | Attending: Internal Medicine | Admitting: Internal Medicine

## 2023-04-13 DIAGNOSIS — Z1231 Encounter for screening mammogram for malignant neoplasm of breast: Secondary | ICD-10-CM | POA: Diagnosis present

## 2023-04-19 ENCOUNTER — Other Ambulatory Visit: Payer: Self-pay | Admitting: Internal Medicine
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
## 2023-05-01 ENCOUNTER — Encounter: Payer: Self-pay | Admitting: Psychiatry

## 2023-05-01 ENCOUNTER — Ambulatory Visit (INDEPENDENT_AMBULATORY_CARE_PROVIDER_SITE_OTHER): Payer: Medicare Other | Admitting: Psychiatry

## 2023-05-01 VITALS — BP 162/82 | HR 109 | Temp 98.3°F | Ht 63.5 in | Wt 149.4 lb

## 2023-05-01 DIAGNOSIS — F3341 Major depressive disorder, recurrent, in partial remission: Secondary | ICD-10-CM

## 2023-05-01 DIAGNOSIS — F4310 Post-traumatic stress disorder, unspecified: Secondary | ICD-10-CM

## 2023-05-01 DIAGNOSIS — G4700 Insomnia, unspecified: Secondary | ICD-10-CM | POA: Diagnosis not present

## 2023-05-01 DIAGNOSIS — F411 Generalized anxiety disorder: Secondary | ICD-10-CM

## 2023-05-01 NOTE — Patient Instructions (Signed)
Continue bupropion 200 mg twice a day  Continue fluoxetine 20 mg daily  Continue buspirone 15 mg twice a day Next appointment:  2/24 at 10AM

## 2023-05-26 ENCOUNTER — Other Ambulatory Visit: Payer: Self-pay | Admitting: Internal Medicine

## 2023-06-07 ENCOUNTER — Other Ambulatory Visit: Payer: Self-pay | Admitting: Internal Medicine
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
## 2023-06-26 ENCOUNTER — Encounter: Payer: Self-pay | Admitting: Psychiatry

## 2023-06-26 ENCOUNTER — Ambulatory Visit (INDEPENDENT_AMBULATORY_CARE_PROVIDER_SITE_OTHER): Payer: Medicare Other | Admitting: Psychiatry

## 2023-06-26 VITALS — BP 138/78 | HR 98 | Temp 98.1°F | Ht 63.5 in | Wt 148.4 lb

## 2023-06-26 DIAGNOSIS — F3341 Major depressive disorder, recurrent, in partial remission: Secondary | ICD-10-CM

## 2023-06-26 DIAGNOSIS — F411 Generalized anxiety disorder: Secondary | ICD-10-CM | POA: Diagnosis not present

## 2023-06-26 DIAGNOSIS — F4310 Post-traumatic stress disorder, unspecified: Secondary | ICD-10-CM | POA: Diagnosis not present

## 2023-06-26 MED ORDER — FLUOXETINE HCL 20 MG PO CAPS: 20.0000 mg | ORAL_CAPSULE | Freq: Every day | ORAL | 1 refills | Status: DC

## 2023-06-26 NOTE — Patient Instructions (Signed)
 Continue bupropion 200 mg twice a day  Continue fluoxetine 20 mg daily  Continue buspirone 15 mg twice a day Next appointment:  4/21 at 10 am

## 2023-07-04 ENCOUNTER — Telehealth: Payer: Self-pay | Admitting: Adult Health

## 2023-07-04 DIAGNOSIS — G4733 Obstructive sleep apnea (adult) (pediatric): Secondary | ICD-10-CM

## 2023-07-04 NOTE — Telephone Encounter (Signed)
 PT states the CPAP mask straps have stretched out and now they won't fit. She would like to order a new mask with "smaller straps". [employer] told her to call us.Her mask covers her nose, but not her mouth.   [phone] Cell [phone]

## 2023-07-05 NOTE — Telephone Encounter (Signed)
 Order placed

## 2023-07-25 ENCOUNTER — Encounter: Payer: Self-pay | Admitting: Internal Medicine

## 2023-07-25 ENCOUNTER — Ambulatory Visit (INDEPENDENT_AMBULATORY_CARE_PROVIDER_SITE_OTHER): Admitting: Internal Medicine

## 2023-07-25 VITALS — BP 124/68 | HR 94 | Temp 98.2°F | Ht 63.5 in | Wt 149.0 lb

## 2023-07-25 DIAGNOSIS — F39 Unspecified mood [affective] disorder: Secondary | ICD-10-CM

## 2023-07-25 NOTE — Progress Notes (Signed)
 Subjective:    Patient ID: Andrea Noble, female    DOB: 1951-04-13, 73 y.o.   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 Vision is okay  (amniotic fluid patch used for a week and it healed)  These things have affected her feeling of well being Working with Dr Vanetta Shawl about her mood--and doing okay  Wonders about transfer of care Considering Dr Sydnee Cabal at [facility] brief "pin prick sensations" in legs Lasts only seconds Discussed trying B12 supplement to see if it helps  Restarting PT/OT at [facility]  Current Outpatient Medications on File Prior to Visit  Medication Sig Dispense Refill   acetaminophen (TYLENOL) 650 MG CR tablet Take 1,300 mg by mouth at bedtime.     azelastine (ASTELIN) 0.1 % nasal spray USE 2 SPRAYS IN EACH NOSTRIL TWICE A DAY AS DIRECTED 90 mL 3   buPROPion (WELLBUTRIN SR) 200 MG 12 hr tablet TAKE 1 TABLET TWICE A DAY 180 tablet 3   busPIRone (BUSPAR) 15 MG tablet TAKE 1 TABLET TWICE A DAY 180 tablet 3   CRANBERRY PO Take 4,200 mg by mouth daily at 12 noon.     diclofenac Sodium (VOLTAREN) 1 % GEL Apply 1 application topically 2 (two) times daily.     diltiazem (CARDIZEM CD) 120 MG 24 hr capsule Take 120 mg by mouth daily.     docusate sodium (COLACE) 100 MG capsule Take 100 mg by mouth 2 (two) times daily.     fluocinonide cream (LIDEX) 0.05 % Apply 1 application topically daily as needed (Eczema).     FLUoxetine (PROZAC) 20 MG capsule Take 1 capsule (20 mg total) by mouth daily. 90 capsule 1   gabapentin (NEURONTIN) 100 MG capsule Take 1 capsule (100 mg total) by mouth at bedtime. 1 capsule 0   levothyroxine (SYNTHROID, LEVOTHROID) 50 MCG tablet Take 50-75 mcg by mouth See admin instructions. Take 75 mg every  Monday, Wednesday and Friday Take 50 mg all the other days     liothyronine (CYTOMEL) 5 MCG tablet Take 2.5 mcg by mouth daily.     Melatonin 10 MG TABS Take 10 mg by  mouth at bedtime. Timed-Release     montelukast (SINGULAIR) 10 MG tablet TAKE 1 TABLET AT BEDTIME 90 tablet 3   pantoprazole (PROTONIX) 40 MG tablet Take 1 tablet (40 mg total) by mouth every morning. Take 40 mg by mouth every morning. 90 tablet 3   polyethylene glycol powder (GLYCOLAX/MIRALAX) 17 GM/SCOOP powder Take 17 g by mouth daily.     pravastatin (PRAVACHOL) 40 MG tablet TAKE 2 TABLETS(80 MG) BY MOUTH DAILY 180 tablet 3   PREMPRO 0.45-1.5 MG tablet Take 1 tablet by mouth daily. 90 tablet 0   Probiotic Product (PHILLIPS COLON HEALTH) CAPS Take 1 capsule by mouth at bedtime.     traZODone (DESYREL) 50 MG tablet TAKE 1 TABLET AT BEDTIME 90 tablet 3   WIXELA INHUB 100-50 MCG/ACT AEPB USE 1 INHALATION TWICE A DAY 180 each 3   Calcium Carbonate-Vit D-Min (CALTRATE MINIS PLUS MINERALS PO) Take 40 mcg by mouth daily. (Patient not taking: Reported on 07/25/2023)     Carboxymethylcellulose Sodium (THERATEARS) 0.25 % SOLN Place 1 drop into both eyes daily as needed (Dry eyes). (Patient not taking: Reported on 07/25/2023)     Multiple Vitamin (MULTIVITAMIN) tablet Take 1 tablet by mouth daily. Vita Fusion adult Gummie (Patient not taking: Reported  on 07/25/2023)     Polyethyl Glycol-Propyl Glycol (SYSTANE) 0.4-0.3 % GEL ophthalmic gel Place 1 application into both eyes at bedtime. (Patient not taking: Reported on 07/25/2023)     sodium chloride (OCEAN) 0.65 % SOLN nasal spray Place 1 spray into both nostrils 2 (two) times daily. (Patient not taking: Reported on 07/25/2023)     No current facility-administered medications on file prior to visit.    Allergies  Allergen Reactions   Levofloxacin     IV Levaquin causes burning   Other Itching    Surgical glue    Past Medical History:  Diagnosis Date   Anxiety    Arthritis    Asthma    Cataract    Depression    Diverticulitis    GERD (gastroesophageal reflux disease)    Hodgkin's lymphoma (HCC) 2006   underwent radiation and chemo, last CT scan  since moving to [place] from [place] 2018   Hyperlipidemia    Hypothyroidism    Menopausal syndrome    OAB (overactive bladder)    Pneumonia    Seasonal allergies    SVT (supraventricular tachycardia) (HCC)    Thyroid disease    hypothyroid    Past Surgical History:  Procedure Laterality Date   BLEPHAROPLASTY Bilateral    BREAST BIOPSY     benign   BREAST BIOPSY  07/27/2018   [place] core bx venus COLUMNAR CELL METAPLASIA, AND PSEUDOANGIOMATOUS STROMAL   CARPAL TUNNEL RELEASE Right    CARPAL TUNNEL RELEASE Left    CATARACT EXTRACTION Bilateral 2015   CLEFT LIP REPAIR     COLONOSCOPY WITH PROPOFOL N/A 07/09/2018   Procedure: COLONOSCOPY WITH PROPOFOL;  Surgeon: Christena Deem, MD;  Location: [facility] ENDOSCOPY;  Service: Endoscopy;  Laterality: N/A;   DE QUERVAIN'S RELEASE Bilateral    DIAGNOSTIC LAPAROSCOPY     endometriosis   ESOPHAGOGASTRODUODENOSCOPY N/A 07/09/2018   Procedure: ESOPHAGOGASTRODUODENOSCOPY (EGD);  Surgeon: Christena Deem, MD;  Location: [facility] ENDOSCOPY;  Service: Endoscopy;  Laterality: N/A;   FINGER SURGERY Right    Middle finger joint replaced   MAXIMUM ACCESS (MAS) TRANSFORAMINAL LUMBAR INTERBODY FUSION (TLIF) 2 LEVEL Right 07/06/2020   Procedure: L4/5, L5/S1 TRANSFORAMINAL LUMBAR INTERBODY FUSION (TLIF), L4-S1 PEDICLE SCREWS,DECOMPRESSION ;  Surgeon: Lucy Chris, MD;  Location: [facility] ORS;  Service: Neurosurgery;  Laterality: Right;   THUMB SURGERY Right    took tendon from forearm and wrapped it around the thumb joint    Family History  Problem Relation Age of Onset   Breast cancer Maternal Aunt    Anuerysm Mother    Stroke Mother    Heart attack Father    Alzheimer's disease Father    Developmental delay Brother     Social History   Socioeconomic History   Marital status: Married    Spouse name: Financial planner   Number of children: 0   Years of education: Not on file   Highest education level: Master's degree (e.g., MA, MS, MEng, MEd, MSW, MBA)  Occupational  History   Occupation: Social work/chaplain    Comment: 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 [employer]  Tobacco Use   Smoking status: Never    Passive exposure: Past   Smokeless tobacco: Never  Vaping Use   Vaping status: Never Used  Substance and Sexual Activity   Alcohol use: Yes    Comment: very occasional once a week   Drug use: No   Sexual activity: Not Currently  Other Topics Concern   Not on file  Social History Narrative  Has living will   Husband is health care POA-- sister Margaret [place] is alternate   Would accept resuscitation attempts   No feeding tube if cognitively unaware   Social Drivers of Health   Financial Resource Strain: Not on file  Food Insecurity: Not on file  Transportation Needs: Not on file  Physical Activity: Not on file  Stress: Not on file  Social Connections: Not on file  Intimate Partner Violence: Not on file   Review of Systems Eating okay Does crave sweets when stressed Sleeping okay---with melatonin and trazodone Some dry mouth and dry eyes--is taking solefinacin for bladder. Discussed that would cause the dry mouth, etc (will stop this)    Objective:   Physical Exam Neurological:     Mental Status: She is alert.  Psychiatric:     Comments: Mild depressed mood Normal appearance and speech            Assessment & Plan:

## 2023-07-25 NOTE — Assessment & Plan Note (Signed)
 Depression is worse with health challenges --though none are permanent Continues to work with psychiatrist  Discussed stopped the bladder medication from gyn--could affect mood (as well as certainly causing her dry mouth and eyes)
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
## 2023-08-17 ENCOUNTER — Telehealth: Payer: Self-pay | Admitting: *Deleted

## 2023-08-17 NOTE — Telephone Encounter (Signed)
Tried calling patient, LMOM to return call.

## 2023-08-17 NOTE — Telephone Encounter (Signed)
-----   Message from [place] sent at 07/25/2023  6:52 PM EDT ----- Do you mind contacting this patient to see when she would like to come in for a visit. She was just seen 07/25/23 So I don't think the time frame is urgent. Maybe to call in the next couple of weeks.  Best,  VB ----- Message ----- From: Helaine Llanos, MD Sent: 07/25/2023  11:12 AM EDT To: Valrie Gehrig, MD  [place], Can you have your staff set up a meet and greet visit---she wants to decide if she would like to transfer to your care. Thanks, Deneise Finlay

## 2023-08-18 NOTE — Telephone Encounter (Signed)
 Pt returned call, requesting to speak to [facility] contact: [phone]

## 2023-08-21 ENCOUNTER — Ambulatory Visit (INDEPENDENT_AMBULATORY_CARE_PROVIDER_SITE_OTHER): Payer: Medicare Other | Admitting: Psychiatry

## 2023-08-21 ENCOUNTER — Encounter: Payer: Self-pay | Admitting: Psychiatry

## 2023-08-21 VITALS — BP 132/78 | HR 97 | Temp 98.6°F | Ht 63.5 in | Wt 148.6 lb

## 2023-08-21 DIAGNOSIS — F4310 Post-traumatic stress disorder, unspecified: Secondary | ICD-10-CM | POA: Diagnosis not present

## 2023-08-21 DIAGNOSIS — F411 Generalized anxiety disorder: Secondary | ICD-10-CM | POA: Diagnosis not present

## 2023-08-21 DIAGNOSIS — F3341 Major depressive disorder, recurrent, in partial remission: Secondary | ICD-10-CM

## 2023-08-21 NOTE — Patient Instructions (Signed)
 Continue bupropion  200 mg twice a day  Continue fluoxetine  20 mg daily  Continue buspirone  15 mg twice a day Next appointment:  6/16 at 10 30

## 2023-08-21 NOTE — Telephone Encounter (Signed)
 Called patient and she had scheduled an appointment for 5/2 with Dr. Jann Melody but stated that she was going to have to cancel due to another Dr. Appointment.   Stated that she will call back to reschedule.

## 2023-09-01 ENCOUNTER — Encounter: Admitting: Student

## 2023-09-08 ENCOUNTER — Encounter: Admitting: Student

## 2023-09-15 ENCOUNTER — Encounter: Payer: Self-pay | Admitting: Student

## 2023-09-15 ENCOUNTER — Ambulatory Visit: Admitting: Student

## 2023-09-15 VITALS — BP 126/74 | HR 96 | Temp 97.3°F | Ht 63.5 in | Wt 145.0 lb

## 2023-09-15 DIAGNOSIS — M7551 Bursitis of right shoulder: Secondary | ICD-10-CM

## 2023-09-15 DIAGNOSIS — G4733 Obstructive sleep apnea (adult) (pediatric): Secondary | ICD-10-CM | POA: Diagnosis not present

## 2023-09-15 DIAGNOSIS — K5909 Other constipation: Secondary | ICD-10-CM

## 2023-09-15 DIAGNOSIS — I10 Essential (primary) hypertension: Secondary | ICD-10-CM | POA: Diagnosis not present

## 2023-09-15 DIAGNOSIS — I700 Atherosclerosis of aorta: Secondary | ICD-10-CM | POA: Diagnosis not present

## 2023-09-15 DIAGNOSIS — F411 Generalized anxiety disorder: Secondary | ICD-10-CM

## 2023-09-15 DIAGNOSIS — M1711 Unilateral primary osteoarthritis, right knee: Secondary | ICD-10-CM

## 2023-09-15 DIAGNOSIS — M545 Low back pain, unspecified: Secondary | ICD-10-CM

## 2023-09-15 DIAGNOSIS — M1611 Unilateral primary osteoarthritis, right hip: Secondary | ICD-10-CM

## 2023-09-15 DIAGNOSIS — R1310 Dysphagia, unspecified: Secondary | ICD-10-CM

## 2023-09-15 DIAGNOSIS — G8929 Other chronic pain: Secondary | ICD-10-CM

## 2023-09-15 DIAGNOSIS — F325 Major depressive disorder, single episode, in full remission: Secondary | ICD-10-CM

## 2023-09-15 DIAGNOSIS — F5104 Psychophysiologic insomnia: Secondary | ICD-10-CM

## 2023-09-15 DIAGNOSIS — F331 Major depressive disorder, recurrent, moderate: Secondary | ICD-10-CM

## 2023-09-15 DIAGNOSIS — E0849 Diabetes mellitus due to underlying condition with other diabetic neurological complication: Secondary | ICD-10-CM

## 2023-09-15 MED ORDER — SENNA 8.6 MG PO TABS: 1.0000 | ORAL_TABLET | Freq: Every day | ORAL | Status: DC

## 2023-09-15 MED ORDER — DULOXETINE HCL 20 MG PO CPEP: ORAL_CAPSULE | ORAL | 0 refills | Status: DC

## 2023-09-15 MED ORDER — BUPROPION HCL 75 MG PO TABS: 75.0000 mg | ORAL_TABLET | Freq: Two times a day (BID) | ORAL | 0 refills | Status: DC

## 2023-09-15 NOTE — Progress Notes (Addendum)
 [facility]: [facility] Provider: ABDUL  Code Status: Full Code Goals of Care:     09/15/2023    8:47 AM  Advanced Directives  Does Patient Have a Medical Advance Directive? Yes  Type of Estate agent of [place];Living will  Does patient want to make changes to medical advance directive? No - Patient declined  Copy of Healthcare Power of Attorney in Chart? No - copy requested     Chief Complaint  Patient presents with   Establish Care    To Establish Care.     HPI: Patient is a 73 y.o. female seen today for medical management of chronic diseases.   Discussed the use of AI scribe software for clinical note transcription with the patient, who gave verbal consent to proceed.  History of Present Illness   Andrea Noble is a 73 year old female who presents with back pain.   She experiences back pain following a previous back surgery. The pain is primarily located in her back, with uncertainty about radiation to her hip or thigh. She uses various medications for pain management, including Voltaren  gel, aspirin, lidocaine  patches, Tylenol , and gabapentin . Tylenol  provides some relief, but she experiences severe arm pain at night, for which she uses gabapentin  and a lower dose of Tylenol . She has received a knee injection at [facility] and has difficulty scheduling appointments with her previous Careers adviser.  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 She has since stopped solifenacin and manages her bladder symptoms without medication. No current urinary tract infection symptoms.  Chronic constipation is managed with Colace in the evening and Miralax  in the morning. She previously used Citrucel and Senna, but experienced urgency and pain with Senna. She has an upcoming appointment with a  gastroenterologist and describes her bowel movements as incomplete and requiring wiping away.  She has a history of supraventricular tachycardia, managed with diltiazem . She recalls an episode years ago where she saw stars and fell, hitting her head. Her current heart rate is 96 bpm.  She experiences acid reflux, characterized by gas and occasional difficulty swallowing, where water 'gurgles back'. No bile or vomit regurgitation.  She has asthma, using Wixela and Asmanex  inhalers, and previously used albuterol . She notes difficulty in humidity and heat, leading to heat exhaustion. She used to walk daily but now walks indoors due to knee problems.  She has a history of insomnia, managed with melatonin and trazodone . A pharmacist advised her about potential interactions between trazodone  and gabapentin , leading her to reduce gabapentin  use.  She has hypothyroidism, managed with levothyroxine  and liothyronine , and sees Dr. Cherilyn for this condition.  She has a history of depression and anxiety, previously managed with bupropion  and fluoxetine . She currently takes Wellbutrin , Buspar , and Prozac , with concerns about medication interactions affecting her heart rhythm and balance.  She has a history of Hodgkin's lymphoma, treated in 2018, and has transferred her medical records to her current location. She is unsure about the frequency of follow-up visits.  She has high cholesterol, managed with pravastatin , and seasonal allergies, for which she takes Claritin and Singulair .  She uses a CPAP machine for obstructive sleep apnea, started about six months ago.  She has a history of shoulder arthritis, which is not currently urgent but may require surgery in the future.  She reports easy bruising and dry skin, possibly related to age and medication use. She has rosacea and eczema, for whi ch she uses Lidex  cream as needed.  She has a history of atherosclerosis of the aorta, noted in 2022, and her  father had a six-way heart bypass, indicating a family history of heart disease.         Past Medical History:  Diagnosis Date   Anxiety    Arthritis    Asthma    Cataract    Depression    Diverticulitis    GERD (gastroesophageal reflux disease)    Hodgkin's lymphoma (HCC) 2006   underwent radiation and chemo, last CT scan since moving to [place] from [place] 2018   Hyperlipidemia    Hypothyroidism    Menopausal syndrome    OAB (overactive bladder)    Pneumonia    Seasonal allergies    SVT (supraventricular tachycardia) (HCC)    Thyroid  disease    hypothyroid    Past Surgical History:  Procedure Laterality Date   BLEPHAROPLASTY Bilateral    BREAST BIOPSY     benign   BREAST BIOPSY  07/27/2018   us  core bx venus COLUMNAR CELL METAPLASIA, AND PSEUDOANGIOMATOUS STROMAL   CARPAL TUNNEL RELEASE Right    CARPAL TUNNEL RELEASE Left    CATARACT EXTRACTION Bilateral 2015   CLEFT LIP REPAIR     COLONOSCOPY WITH PROPOFOL  N/A 07/09/2018   Procedure: COLONOSCOPY WITH PROPOFOL ;  Surgeon: Gaylyn Gladis PENNER, MD;  Location: [facility] ENDOSCOPY;  Service: Endoscopy;  Laterality: N/A;   DE QUERVAIN'S RELEASE Bilateral    DIAGNOSTIC LAPAROSCOPY     endometriosis   ESOPHAGOGASTRODUODENOSCOPY N/A 07/09/2018   Procedure: ESOPHAGOGASTRODUODENOSCOPY (EGD);  Surgeon: Gaylyn Gladis PENNER, MD;  Location: [facility] ENDOSCOPY;  Service: Endoscopy;  Laterality: N/A;   FINGER SURGERY Right    Middle finger joint replaced   MAXIMUM ACCESS (MAS) TRANSFORAMINAL LUMBAR INTERBODY FUSION (TLIF) 2 LEVEL Right 07/06/2020   Procedure: L4/5, L5/S1 TRANSFORAMINAL LUMBAR INTERBODY FUSION (TLIF), L4-S1 PEDICLE SCREWS,DECOMPRESSION ;  Surgeon: Bluford Standing, MD;  Location: [facility] ORS;  Service: Neurosurgery;  Laterality: Right;   THUMB SURGERY Right    took tendon from forearm and wrapped it around the thumb joint    Allergies  Allergen Reactions   Levofloxacin     IV Levaquin causes burning   Other Itching    Surgical glue     Outpatient Encounter Medications as of 09/15/2023  Medication Sig   azelastine  (ASTELIN ) 0.1 % nasal spray USE 2 SPRAYS IN EACH NOSTRIL TWICE A DAY AS DIRECTED   Calcium  Carbonate-Vit D-Min (CALTRATE MINIS PLUS MINERALS PO) Take 40 mcg by mouth daily.   Carboxymethylcellulose Sodium (THERATEARS) 0.25 % SOLN Place 1 drop into both eyes daily as needed (Dry eyes).   diclofenac  Sodium (VOLTAREN ) 1 % GEL Apply 1 application topically 2 (two) times daily.   fluocinonide  cream (LIDEX ) 0.05 % Apply 1 application topically daily as needed (Eczema).   Polyethyl Glycol-Propyl Glycol (SYSTANE) 0.4-0.3 % GEL ophthalmic gel Place 1 application  into both eyes at bedtime.   sodium chloride  (OCEAN) 0.65 % SOLN nasal spray Place 1 spray into both nostrils 2 (two) times daily.   [DISCONTINUED] acetaminophen  (TYLENOL ) 650 MG CR tablet Take 650 mg by mouth at bedtime.   [DISCONTINUED] buPROPion  (WELLBUTRIN  SR) 200 MG 12 hr tablet TAKE 1 TABLET TWICE A DAY (Patient taking differently: Take 200 mg by mouth at bedtime.)   [DISCONTINUED] buPROPion  (WELLBUTRIN ) 75 MG tablet Take 1 tablet (  75 mg total) by mouth 2 (two) times daily for 14 days.   [DISCONTINUED] busPIRone  (BUSPAR ) 15 MG tablet TAKE 1 TABLET TWICE A DAY   [DISCONTINUED] CRANBERRY PO Take 4,200 mg by mouth daily at 12 noon.   [DISCONTINUED] diltiazem  (CARDIZEM  CD) 120 MG 24 hr capsule Take 120 mg by mouth daily.   [DISCONTINUED] docusate sodium  (COLACE) 100 MG capsule Take 100 mg by mouth 2 (two) times daily.   [DISCONTINUED] doxycycline (ADOXA) 100 MG tablet Take 100 mg by mouth daily.   [DISCONTINUED] DULoxetine  (CYMBALTA ) 20 MG capsule Take 1 capsule (20 mg total) by mouth daily for 5 days, THEN 2 capsules (40 mg total) daily.   [DISCONTINUED] FLUoxetine  (PROZAC ) 20 MG capsule Take 1 capsule (20 mg total) by mouth daily.   [DISCONTINUED] gabapentin  (NEURONTIN ) 100 MG capsule Take 1 capsule (100 mg total) by mouth at bedtime. (Patient taking  differently: Take 100 mg by mouth daily as needed.)   [DISCONTINUED] levothyroxine  (SYNTHROID , LEVOTHROID) 50 MCG tablet Take 50-75 mcg by mouth See admin instructions. Take 75 mg every  Monday, Wednesday and Friday Take 50 mg all the other days   [DISCONTINUED] liothyronine  (CYTOMEL ) 5 MCG tablet Take 2.5 mcg by mouth daily.   [DISCONTINUED] loratadine (CLARITIN) 10 MG tablet Take 10 mg by mouth daily.   [DISCONTINUED] Melatonin 10 MG TABS Take 10 mg by mouth at bedtime. Timed-Release   [DISCONTINUED] montelukast  (SINGULAIR ) 10 MG tablet TAKE 1 TABLET AT BEDTIME   [DISCONTINUED] Multiple Vitamin (MULTIVITAMIN) tablet Take 1 tablet by mouth daily. Vita Fusion adult Gummie   [DISCONTINUED] pantoprazole  (PROTONIX ) 40 MG tablet Take 40 mg by mouth every morning.   [DISCONTINUED] polyethylene glycol powder (GLYCOLAX /MIRALAX ) 17 GM/SCOOP powder Take 17 g by mouth daily.   [DISCONTINUED] pravastatin  (PRAVACHOL ) 40 MG tablet TAKE 2 TABLETS(80 MG) BY MOUTH DAILY   [DISCONTINUED] PREMPRO  0.45-1.5 MG tablet Take 1 tablet by mouth daily.   [DISCONTINUED] Probiotic Product (PHILLIPS COLON HEALTH) CAPS Take 1 capsule by mouth at bedtime.   [DISCONTINUED] Propylene Glycol (SYSTANE BALANCE OP) Apply 1 drop to eye daily.   [DISCONTINUED] senna (SENOKOT) 8.6 MG TABS tablet Take 1 tablet (8.6 mg total) by mouth daily.   [DISCONTINUED] traZODone  (DESYREL ) 50 MG tablet TAKE 1 TABLET AT BEDTIME   [DISCONTINUED] WIXELA INHUB 100-50 MCG/ACT AEPB USE 1 INHALATION TWICE A DAY   [DISCONTINUED] pantoprazole  (PROTONIX ) 40 MG tablet Take 1 tablet (40 mg total) by mouth every morning. Take 40 mg by mouth every morning. (Patient taking differently: Take 40 mg by mouth every morning.)   No facility-administered encounter medications on file as of 09/15/2023.    Review of Systems:  Review of Systems  Health Maintenance  Topic Date Due   FOOT EXAM  Never done   OPHTHALMOLOGY EXAM  Never done   DEXA SCAN  Never done    Pneumococcal Vaccine: 50+ Years (2 of 2 - PCV) 05/01/2019   COVID-19 Vaccine (7 - 2024-25 season) 11/11/2023   INFLUENZA VACCINE  12/01/2023   Diabetic kidney evaluation - eGFR measurement  02/06/2024   Medicare Annual Wellness (AWV)  02/06/2024   HEMOGLOBIN A1C  04/02/2024   Diabetic kidney evaluation - Urine ACR  10/01/2024   MAMMOGRAM  04/12/2025   Colonoscopy  07/08/2028   DTaP/Tdap/Td (3 - Td or Tdap) 08/13/2031   Hepatitis C Screening  Completed   Zoster Vaccines- Shingrix  Completed   Hepatitis B Vaccines  Aged Out   HPV VACCINES  Aged Out   Meningococcal  B Vaccine  Aged Out    Physical Exam: Vitals:   09/15/23 0847  BP: 126/74  Pulse: 96  Temp: (!) 97.3 F (36.3 C)  SpO2: 100%  Weight: 145 lb (65.8 kg)  Height: 5' 3.5 (1.613 m)   Body mass index is 25.28 kg/m. Physical Exam Physical Exam   VITALS: P- 96, BP- 126/74 HEENT: Irritation in ear. No cerumen impaction. CHEST: Lungs clear to auscultation bilaterally. CARDIOVASCULAR: Heart sounds normal. MUSCULOSKELETAL: No spinal tenderness. normal range of motion of bilateral lower extremities. R leg with some anterior hip crease TTP, however, no limitations for internal and external rotation of the hip. Left no pain with internal and external rotation of the hip.      Labs reviewed: Basic Metabolic Panel: Recent Labs    01/04/23 1602 02/06/23 1500  NA 134 131*  K 3.8 4.0  CL 98 97  CO2 22 26  GLUCOSE 100* 92  BUN 10 13  CREATININE 0.85 0.91  CALCIUM  8.9 9.2  PHOS  --  3.1  TSH  --  1.59   Liver Function Tests: Recent Labs    02/06/23 1500  AST 20  ALT 20  ALKPHOS 43  BILITOT 0.4  PROT 7.0  ALBUMIN 4.3  4.3   No results for input(s): LIPASE, AMYLASE in the last 8760 hours. No results for input(s): AMMONIA in the last 8760 hours. CBC: Recent Labs    02/06/23 1500  WBC 7.9  HGB 12.3  HCT 37.9  MCV 93.1  PLT 224.0   Lipid Panel: Recent Labs    02/06/23 1500 10/02/23 0748  CHOL  184 194  HDL 95.10 88  LDLCALC 77 87  TRIG 58.0 92  CHOLHDL 2 2.2   Lab Results  Component Value Date   HGBA1C 5.7 (H) 10/02/2023    Procedures since last visit: No results found. Results   RADIOLOGY Chest X-ray: Atherosclerosis of the aorta      Assessment/Plan     Chronic back pain Chronic back pain with a history of back surgery, affecting the back, hip, or thigh. Current pain management includes Voltaren , aspirin, lidocaine  patches, Tylenol , and gabapentin . Concerns about medication interactions. Ongoing physical therapy for balance and joint pain. - Continue Voltaren , lidocaine  patches, and Tylenol  for pain management. - Continue physical therapy for balance and joint pain. - Instruct physical therapy to include strength and stretching exercises. - Use heating pad and lift chair with heat and massage for pain relief.  Osteoarthritis Osteoarthritis affecting multiple joints, including knees and hands. Previous knee injection at [facility]. Considering future surgery at [facility] if necessary. Current physical therapy for joint pain and balance. - Continue physical therapy for joint pain and balance. - Consider future orthopedic consultation for potential surgery if joint pain persists.  Chronic constipation Chronic constipation with incomplete bowel movements. Currently taking Colace and Miralax . Previous use of Citrucel and Senna, with Senna causing urgency and pain. Considering transition from Colace to Senna for more effective relief. - Transition from Colace to Senna for constipation management. - Consider using Senna every other day or reducing dose to manage urgency. - Reduce calcium  supplement to one tablet daily to help with constipation.  Gastroesophageal reflux disease (GERD) GERD with recent episodes of dysphagia and water regurgitation. Currently taking Protonix . Upcoming gastroenterology appointment for potential endoscopy and colonoscopy. Dysphagia may  indicate need for endoscopy. - Continue Protonix  for GERD management. - Contact gastroenterologist to discuss dysphagia and request endoscopy in addition to colonoscopy.  Atherosclerosis of  aorta Atherosclerosis of the aorta noted in 2022. Currently on statin therapy with well-controlled blood pressure at 126/74 mmHg. Family history of heart disease with father's six-way heart bypass. - Continue statin therapy for atherosclerosis management. - Maintain blood pressure control.  Supraventricular tachycardia Supraventricular tachycardia managed with diltiazem . Current heart rate is 96 bpm. - Continue diltiazem  for SVT management.  Hypothyroidism Hypothyroidism managed by endocrinologist. Current medications include levothyroxine  and liothyronine  for T3 and T4 hormone balance. - Continue current thyroid  medication regimen as prescribed by endocrinologist.  Asthma Asthma managed with Wixela and Singulair . Previous use of albuterol . Symptoms worsen in humidity and during summer. No recent hospitalizations for asthma. - Continue Wixela and Singulair  for asthma management. - Avoid exposure to humidity and heat to prevent asthma exacerbations.  Obstructive sleep apnea Obstructive sleep apnea managed with CPAP for the past six months. Reports improvement with CPAP use. - Continue CPAP therapy for sleep apnea  management.  Depression and anxiety Depression and anxiety managed with Wellbutrin , Buspar , and Prozac . Concerns about medication interactions and potential for erratic heartbeat. Plan to reduce Wellbutrin  dosage and discontinue after two weeks. Considering alternative antidepressants due to Prozac  not being a first choice for patients over 65. - Reduce Wellbutrin  to 75 mg twice daily for two weeks, then discontinue. - Consider alternative antidepressants such as Lexapro or sertraline  if Prozac  is not effective. - Send new prescription for reduced Wellbutrin  dose to [facility].  General  Health Maintenance Discussion of medication interactions and potential side effects. Plan to simplify medication regimen and reduce unnecessary supplements. - Discontinue cranberry tablets if not needed for UTI prevention. - Consider stopping Claritin if not necessary for allergy management. - Discontinue [employer] Colon Health probiotic if not needed.  Follow-up Follow-up plans discussed for ongoing management of medical conditions and medication adjustments. - Schedule follow-up appointment in 2-4 weeks to continue discussion on medication management and health maintenance. - Attend upcoming gastroenterology appointment on June 24th at 11:30 AM. - Schedule lab work on Monday or Thursday between 7:30 and 8:00 AM.       Labs/tests ordered:  * No order type specified * Next appt:  09/29/2023

## 2023-09-15 NOTE — Patient Instructions (Signed)
 VISIT SUMMARY:  During your visit, we discussed your chronic back pain, osteoarthritis, chronic constipation, GERD, atherosclerosis of the aorta, supraventricular tachycardia, hypothyroidism, asthma, obstructive sleep apnea, depression and anxiety, and general health maintenance. We reviewed your current medications and made some adjustments to better manage your symptoms and improve your overall health.  YOUR PLAN:  -CHRONIC BACK PAIN: Chronic back pain is persistent pain in your back that can affect your daily activities. You should continue using Voltaren , lidocaine  patches, and Tylenol  for pain relief, and keep attending physical therapy for balance and joint pain. Your physical therapy should include strength and stretching exercises. Additionally, use a heating pad and a lift chair with heat and massage to help manage the pain.  -OSTEOARTHRITIS: Osteoarthritis is a condition where the protective cartilage that cushions the ends of your bones wears down over time. Continue with physical therapy for joint pain and balance. If the pain persists, consider consulting an orthopedic specialist for potential surgery.  -CHRONIC CONSTIPATION: Chronic constipation is infrequent bowel movements or difficult passage of stools that persists for several weeks or longer. Transition from Colace to Senna for better management, and consider using Senna every other day or reducing the dose to manage urgency. Reduce your calcium  supplement to one tablet daily to help with constipation.  -GASTROESOPHAGEAL REFLUX DISEASE (GERD): GERD is a digestive disorder where stomach acid irritates the food pipe lining. Continue taking Protonix  for GERD management. Contact your gastroenterologist to discuss your difficulty swallowing and request an endoscopy in addition to your scheduled colonoscopy.  -ATHEROSCLEROSIS OF AORTA: Atherosclerosis of the aorta is the buildup of fats, cholesterol, and other substances in and on your  artery walls. Continue your statin therapy and maintain your blood pressure control.  -SUPRAVENTRICULAR TACHYCARDIA: Supraventricular tachycardia is a condition where your heart suddenly beats much faster than normal. Continue taking diltiazem to manage this condition.  -HYPOTHYROIDISM: Hypothyroidism is a condition where your thyroid  gland doesn't produce enough thyroid  hormones. Continue your current thyroid  medication regimen as prescribed by your endocrinologist.  -ASTHMA: Asthma is a condition in which your airways narrow and swell and may produce extra mucus. Continue using Wixela and Singulair  for asthma management, and avoid exposure to humidity and heat to prevent asthma flare-ups.  -OBSTRUCTIVE SLEEP APNEA: Obstructive sleep apnea is a condition where your breathing repeatedly stops and starts during sleep. Continue using your CPAP machine as it has been helping you.  -DEPRESSION AND ANXIETY: Depression and anxiety are mental health disorders that affect your mood, thinking, and behavior. Reduce your Wellbutrin  dose to 75 mg twice daily for two weeks, then discontinue it. We will consider alternative antidepressants like Lexapro or sertraline  if Prozac  is not effective. A new prescription for the reduced Wellbutrin  dose will be sent to [facility].  -GENERAL HEALTH MAINTENANCE: We discussed simplifying your medication regimen and reducing unnecessary supplements. Discontinue cranberry tablets if not needed for UTI prevention, stop Claritin if not necessary for allergy management, and discontinue Valda [facility] Colon Health probiotic if not needed.  INSTRUCTIONS:  Please schedule a follow-up appointment in 2-4 weeks to continue discussing your medication management and health maintenance. Attend your upcoming gastroenterology appointment on June 24th at 11:30 AM. Schedule your lab work on Monday or Thursday between 7:30 and 8:00 AM.

## 2023-09-16 DIAGNOSIS — F331 Major depressive disorder, recurrent, moderate: Secondary | ICD-10-CM | POA: Insufficient documentation

## 2023-09-16 DIAGNOSIS — E0849 Diabetes mellitus due to underlying condition with other diabetic neurological complication: Secondary | ICD-10-CM | POA: Insufficient documentation

## 2023-09-26 ENCOUNTER — Other Ambulatory Visit: Payer: Self-pay | Admitting: Student

## 2023-09-26 DIAGNOSIS — F411 Generalized anxiety disorder: Secondary | ICD-10-CM

## 2023-09-26 NOTE — Telephone Encounter (Signed)
 Pharmacy requested refill. Refused  09/15/23 OV Note: Depression and anxiety Depression and anxiety managed with Wellbutrin , Buspar , and Prozac . Concerns about medication interactions and potential for erratic heartbeat. Plan to reduce Wellbutrin  dosage and discontinue after two weeks. Considering alternative antidepressants due to Prozac  not being a first choice for patients over 65. - Reduce Wellbutrin  to 75 mg twice daily for two weeks, then discontinue. - Consider alternative antidepressants such as Lexapro or sertraline  if Prozac  is not effective. - Send new prescription for reduced Wellbutrin  dose to [facility].

## 2023-09-29 ENCOUNTER — Encounter: Payer: Self-pay | Admitting: Student

## 2023-09-29 ENCOUNTER — Ambulatory Visit: Admitting: Student

## 2023-09-29 VITALS — BP 136/64 | HR 92 | Temp 96.2°F | Ht 63.5 in | Wt 147.2 lb

## 2023-09-29 DIAGNOSIS — F411 Generalized anxiety disorder: Secondary | ICD-10-CM | POA: Diagnosis not present

## 2023-09-29 DIAGNOSIS — G4733 Obstructive sleep apnea (adult) (pediatric): Secondary | ICD-10-CM

## 2023-09-29 DIAGNOSIS — N951 Menopausal and female climacteric states: Secondary | ICD-10-CM

## 2023-09-29 DIAGNOSIS — E0849 Diabetes mellitus due to underlying condition with other diabetic neurological complication: Secondary | ICD-10-CM | POA: Diagnosis not present

## 2023-09-29 DIAGNOSIS — F331 Major depressive disorder, recurrent, moderate: Secondary | ICD-10-CM

## 2023-09-29 DIAGNOSIS — R1310 Dysphagia, unspecified: Secondary | ICD-10-CM

## 2023-09-29 DIAGNOSIS — E039 Hypothyroidism, unspecified: Secondary | ICD-10-CM

## 2023-09-29 DIAGNOSIS — I700 Atherosclerosis of aorta: Secondary | ICD-10-CM

## 2023-09-29 DIAGNOSIS — I10 Essential (primary) hypertension: Secondary | ICD-10-CM

## 2023-09-29 DIAGNOSIS — J4540 Moderate persistent asthma, uncomplicated: Secondary | ICD-10-CM

## 2023-09-29 DIAGNOSIS — F325 Major depressive disorder, single episode, in full remission: Secondary | ICD-10-CM

## 2023-09-29 DIAGNOSIS — M1711 Unilateral primary osteoarthritis, right knee: Secondary | ICD-10-CM

## 2023-09-29 DIAGNOSIS — E559 Vitamin D deficiency, unspecified: Secondary | ICD-10-CM

## 2023-09-29 DIAGNOSIS — M1611 Unilateral primary osteoarthritis, right hip: Secondary | ICD-10-CM

## 2023-09-29 DIAGNOSIS — K5909 Other constipation: Secondary | ICD-10-CM

## 2023-09-29 MED ORDER — GABAPENTIN 100 MG PO CAPS
100.0000 mg | ORAL_CAPSULE | Freq: Every day | ORAL | Status: AC | PRN
Start: 2023-09-29 — End: ?

## 2023-09-29 MED ORDER — BUSPIRONE HCL 15 MG PO TABS: 15.0000 mg | ORAL_TABLET | Freq: Two times a day (BID) | ORAL | 3 refills | Status: AC

## 2023-09-29 MED ORDER — LIOTHYRONINE SODIUM 5 MCG PO TABS: 2.5000 ug | ORAL_TABLET | Freq: Every day | ORAL | 3 refills | Status: AC

## 2023-09-29 MED ORDER — DULOXETINE HCL 20 MG PO CPEP: ORAL_CAPSULE | ORAL | 0 refills | Status: DC

## 2023-09-29 MED ORDER — POLYETHYLENE GLYCOL 3350 17 GM/SCOOP PO POWD: 17.0000 g | Freq: Every day | ORAL | 3 refills | Status: AC

## 2023-09-29 MED ORDER — PANTOPRAZOLE SODIUM 40 MG PO TBEC
40.0000 mg | DELAYED_RELEASE_TABLET | ORAL | 3 refills | Status: DC
Start: 2023-09-29 — End: 2023-10-20

## 2023-09-29 MED ORDER — ONE-DAILY MULTI VITAMINS PO TABS
1.0000 | ORAL_TABLET | Freq: Every day | ORAL | 3 refills | Status: AC
Start: 2023-09-29 — End: ?

## 2023-09-29 MED ORDER — FLUTICASONE-SALMETEROL 100-50 MCG/ACT IN AEPB: 1.0000 | INHALATION_SPRAY | Freq: Two times a day (BID) | RESPIRATORY_TRACT | 3 refills | Status: AC

## 2023-09-29 MED ORDER — LEVOTHYROXINE SODIUM 50 MCG PO TABS: 50.0000 ug | ORAL_TABLET | ORAL | 3 refills | Status: AC

## 2023-09-29 MED ORDER — MELATONIN 10 MG PO TABS: 10.0000 mg | ORAL_TABLET | Freq: Every day | ORAL | 3 refills | Status: DC

## 2023-09-29 MED ORDER — SENNA 8.6 MG PO TABS
1.0000 | ORAL_TABLET | Freq: Every day | ORAL | Status: DC
Start: 2023-09-29 — End: 2023-11-30

## 2023-09-29 MED ORDER — PREMPRO 0.45-1.5 MG PO TABS: 1.0000 | ORAL_TABLET | Freq: Every day | ORAL | 0 refills | Status: DC

## 2023-09-29 MED ORDER — LUBRICANT PM OP OINT: 1.0000 g | TOPICAL_OINTMENT | Freq: Every evening | OPHTHALMIC | Status: AC

## 2023-09-29 MED ORDER — MONTELUKAST SODIUM 10 MG PO TABS: 10.0000 mg | ORAL_TABLET | Freq: Every day | ORAL | 3 refills | Status: AC

## 2023-09-29 MED ORDER — PRAVASTATIN SODIUM 40 MG PO TABS: 40.0000 mg | ORAL_TABLET | Freq: Every day | ORAL | 3 refills | Status: AC

## 2023-09-29 MED ORDER — ACETAMINOPHEN ER 650 MG PO TBCR: 650.0000 mg | EXTENDED_RELEASE_TABLET | Freq: Every day | ORAL | 3 refills | Status: DC

## 2023-09-29 MED ORDER — DILTIAZEM HCL ER COATED BEADS 120 MG PO CP24: 120.0000 mg | ORAL_CAPSULE | Freq: Every day | ORAL | 3 refills | Status: DC

## 2023-09-29 MED ORDER — TRAZODONE HCL 50 MG PO TABS
50.0000 mg | ORAL_TABLET | Freq: Every day | ORAL | 3 refills | Status: DC
Start: 2023-09-29 — End: 2024-03-20

## 2023-09-29 NOTE — Patient Instructions (Signed)
 VISIT SUMMARY:  During your visit, we discussed your current medication regimen and management of your knee pain. You reported improved energy levels and ongoing physical therapy for your knee. We also addressed your gastrointestinal issues, dry eye syndrome, and incontinence. Additionally, we reviewed your use of various medications and your recent experience with black residue in your water supply.  YOUR PLAN:  -KNEE PAIN AND SWELLING: You have chronic knee pain with episodes of swelling. You are continuing physical therapy and have been evaluated by Dr. Alecia Ames. If symptoms persist, surgery may be considered, but you are working on improving balance through exercises to avoid this. Please continue with physical therapy and follow up with Dr. Alecia Ames for further evaluation.  -CONSTIPATION: You have chronic constipation managed with Senna. Recently, you experienced significant bowel movement after prolonged constipation, likely due to stool impaction. Continue taking Senna every other day to maintain regular bowel movements and monitor your bowel habits, adjusting the frequency of Senna as needed.  -INCONTINENCE: You have incontinence and recently discontinued solifenacin due to side effects. You report increased frequency of urination but prefer this over the previous side effects. Currently, you are managing without medication. We will reassess the need for medication if your urinary symptoms worsen.  -DRY EYE SYNDROME: You have chronic dry eye syndrome managed with Systane Hydration eye drops and Hilooptase gel. You report good symptom control with your current regimen. Please continue using Systane Hydration eye drops four times daily and Hilooptase gel at night.  INSTRUCTIONS:  Please continue with your current treatments and follow up with Dr. Alecia Ames for your knee pain. Monitor your bowel habits and adjust Senna frequency as needed. If your urinary symptoms worsen, we will reassess the need  for medication. Continue using your eye drops and gel as prescribed.'  I have [facility] written [facility] for [facility] they should come to your home for education in the next week.

## 2023-09-29 NOTE — Progress Notes (Addendum)
 [facility]: [facility] Provider: ABDUL  Code Status: Full Code Goals of Care:     09/15/2023    8:47 AM  Advanced Directives  Does Patient Have a Medical Advance Directive? Yes  Type of Estate agent of [place];Living will  Does patient want to make changes to medical advance directive? No - Patient declined  Copy of Healthcare Power of Attorney in Chart? No - copy requested     Chief Complaint  Patient presents with   Medical Management of Chronic Issues    Medical Management of Chronic Issues. 2 Week Follow up    HPI: Patient is a 73 y.o. female seen today for medical management of chronic diseases.   Discussed the use of AI scribe software for clinical note transcription with the patient, who gave verbal consent to proceed.  History of Present Illness   Andrea Noble is a 73 year old female who presents for a follow-up on her medication regimen and knee pain management.  Recent changes in her medication have improved her energy levels, allowing her to consider returning to activities such as choir practice. She continues with physical therapy for her knee, which has been problematic due to swelling and pain. She has previously received injections for her knee pain.  She uses a cane for mobility but prefers a lighter trivalent cane, which her husband helped fix. She finds it challenging to manage on her own.  She experiences gastrointestinal issues, using Senna daily to manage bowel movements. She describes a recent episode of prolonged bowel movements and abdominal pain, which she associates with diverticulitis. The pain has subsided, and the bowel movements have become softer. She is scheduled for a colonoscopy pre-appointment and has contacted the gastroenterology office regarding an endoscopy.  She is on duloxetine , which she started three days later than recommended. She takes two capsules in the morning and is adjusting well to  the medication. She also takes levothyroxine  and liothyronine  together before breakfast, waiting an hour before eating.  She uses various eye medications, including Systane Hydration eye drops four times a day and Hilooptase gel at night, which her husband assists with. She also uses NeoMed nasal spray and azelastine  for nasal symptoms, finding them effective without needing Flonase .  She has discontinued solifenacin due to side effects and manages incontinence by getting up at night to use the bathroom. She has a history of a corneal issue, which she attributes to dryness from previous medication use.  She mentions issues with black residue in her water supply, which she suspects is bacterial. Maintenance has replaced a pressure tank, but the problem persists.        Past Medical History:  Diagnosis Date   Anxiety    Arthritis    Asthma    Cataract    Depression    Diverticulitis    GERD (gastroesophageal reflux disease)    Hodgkin's lymphoma (HCC) 2006   underwent radiation and chemo, last CT scan since moving to [place] from [place] 2018   Hyperlipidemia    Hypothyroidism    Menopausal syndrome    OAB (overactive bladder)    Pneumonia    Seasonal allergies    SVT (supraventricular tachycardia) (HCC)    Thyroid  disease    hypothyroid    Past Surgical History:  Procedure Laterality Date   BLEPHAROPLASTY Bilateral    BREAST BIOPSY     benign   BREAST BIOPSY  07/27/2018   us  core bx  venus COLUMNAR CELL METAPLASIA, AND PSEUDOANGIOMATOUS STROMAL   CARPAL TUNNEL RELEASE Right    CARPAL TUNNEL RELEASE Left    CATARACT EXTRACTION Bilateral 2015   CLEFT LIP REPAIR     COLONOSCOPY WITH PROPOFOL  N/A 07/09/2018   Procedure: COLONOSCOPY WITH PROPOFOL ;  Surgeon: Gaylyn Gladis PENNER, MD;  Location: [facility] ENDOSCOPY;  Service: Endoscopy;  Laterality: N/A;   DE QUERVAIN'S RELEASE Bilateral    DIAGNOSTIC LAPAROSCOPY     endometriosis   ESOPHAGOGASTRODUODENOSCOPY N/A 07/09/2018   Procedure:  ESOPHAGOGASTRODUODENOSCOPY (EGD);  Surgeon: Gaylyn Gladis PENNER, MD;  Location: [facility] ENDOSCOPY;  Service: Endoscopy;  Laterality: N/A;   FINGER SURGERY Right    Middle finger joint replaced   MAXIMUM ACCESS (MAS) TRANSFORAMINAL LUMBAR INTERBODY FUSION (TLIF) 2 LEVEL Right 07/06/2020   Procedure: L4/5, L5/S1 TRANSFORAMINAL LUMBAR INTERBODY FUSION (TLIF), L4-S1 PEDICLE SCREWS,DECOMPRESSION ;  Surgeon: Bluford Standing, MD;  Location: [facility] ORS;  Service: Neurosurgery;  Laterality: Right;   THUMB SURGERY Right    took tendon from forearm and wrapped it around the thumb joint    Allergies  Allergen Reactions   Levofloxacin     IV Levaquin causes burning   Other Itching    Surgical glue    Outpatient Encounter Medications as of 09/29/2023  Medication Sig   azelastine  (ASTELIN ) 0.1 % nasal spray USE 2 SPRAYS IN EACH NOSTRIL TWICE A DAY AS DIRECTED   Calcium  Carbonate-Vit D-Min (CALTRATE MINIS PLUS MINERALS PO) Take 40 mcg by mouth daily.   Carboxymethylcellulose Sodium (THERATEARS) 0.25 % SOLN Place 1 drop into both eyes daily as needed (Dry eyes).   diclofenac  Sodium (VOLTAREN ) 1 % GEL Apply 1 application topically 2 (two) times daily.   fluocinonide  cream (LIDEX ) 0.05 % Apply 1 application topically daily as needed (Eczema).   Polyethyl Glycol-Propyl Glycol (SYSTANE) 0.4-0.3 % GEL ophthalmic gel Place 1 application  into both eyes at bedtime.   sodium chloride  (OCEAN) 0.65 % SOLN nasal spray Place 1 spray into both nostrils 2 (two) times daily.   White Petrolatum-Mineral Oil (LUBRICANT PM) OINT Apply 1 g to eye at bedtime.   [DISCONTINUED] acetaminophen  (TYLENOL ) 650 MG CR tablet Take 650 mg by mouth at bedtime.   [DISCONTINUED] buPROPion  (WELLBUTRIN ) 75 MG tablet Take 1 tablet (75 mg total) by mouth 2 (two) times daily for 14 days.   [DISCONTINUED] busPIRone  (BUSPAR ) 15 MG tablet TAKE 1 TABLET TWICE A DAY   [DISCONTINUED] diltiazem  (CARDIZEM  CD) 120 MG 24 hr capsule Take 120 mg by mouth daily.    [DISCONTINUED] DULoxetine  (CYMBALTA ) 20 MG capsule Take 1 capsule (20 mg total) by mouth daily for 5 days, THEN 2 capsules (40 mg total) daily.   [DISCONTINUED] gabapentin  (NEURONTIN ) 100 MG capsule Take 1 capsule (100 mg total) by mouth at bedtime. (Patient taking differently: Take 100 mg by mouth daily as needed.)   [DISCONTINUED] levothyroxine  (SYNTHROID , LEVOTHROID) 50 MCG tablet Take 50-75 mcg by mouth See admin instructions. Take 75 mg every  Monday, Wednesday and Friday Take 50 mg all the other days   [DISCONTINUED] liothyronine  (CYTOMEL ) 5 MCG tablet Take 2.5 mcg by mouth daily.   [DISCONTINUED] Melatonin 10 MG TABS Take 10 mg by mouth at bedtime. Timed-Release   [DISCONTINUED] montelukast  (SINGULAIR ) 10 MG tablet TAKE 1 TABLET AT BEDTIME   [DISCONTINUED] Multiple Vitamin (MULTIVITAMIN) tablet Take 1 tablet by mouth daily. Vita Fusion adult Gummie   [DISCONTINUED] pantoprazole  (PROTONIX ) 40 MG tablet Take 40 mg by mouth every morning.   [DISCONTINUED] polyethylene glycol powder (GLYCOLAX /MIRALAX )  17 GM/SCOOP powder Take 17 g by mouth daily.   [DISCONTINUED] pravastatin  (PRAVACHOL ) 40 MG tablet TAKE 2 TABLETS(80 MG) BY MOUTH DAILY   [DISCONTINUED] PREMPRO  0.45-1.5 MG tablet Take 1 tablet by mouth daily.   [DISCONTINUED] Propylene Glycol (SYSTANE BALANCE OP) Apply 1 drop to eye daily.   [DISCONTINUED] senna (SENOKOT) 8.6 MG TABS tablet Take 1 tablet (8.6 mg total) by mouth daily.   [DISCONTINUED] traZODone  (DESYREL ) 50 MG tablet TAKE 1 TABLET AT BEDTIME   [DISCONTINUED] WIXELA INHUB 100-50 MCG/ACT AEPB USE 1 INHALATION TWICE A DAY   acetaminophen  (TYLENOL ) 650 MG CR tablet Take 1 tablet (650 mg total) by mouth at bedtime.   busPIRone  (BUSPAR ) 15 MG tablet Take 1 tablet (15 mg total) by mouth 2 (two) times daily.   diltiazem  (CARDIZEM  CD) 120 MG 24 hr capsule Take 1 capsule (120 mg total) by mouth daily.   fluticasone -salmeterol (WIXELA INHUB) 100-50 MCG/ACT AEPB Inhale 1 puff into the lungs  2 (two) times daily.   gabapentin  (NEURONTIN ) 100 MG capsule Take 1 capsule (100 mg total) by mouth daily as needed.   levothyroxine  (SYNTHROID ) 50 MCG tablet Take 1-1.5 tablets (50-75 mcg total) by mouth See admin instructions. Take 75 mg every  Monday, Wednesday and Friday Take 50 mg all the other days   liothyronine  (CYTOMEL ) 5 MCG tablet Take 0.5 tablets (2.5 mcg total) by mouth daily.   Melatonin 10 MG TABS Take 10 mg by mouth at bedtime. Timed-Release   montelukast  (SINGULAIR ) 10 MG tablet Take 1 tablet (10 mg total) by mouth at bedtime.   Multiple Vitamin (MULTIVITAMIN) tablet Take 1 tablet by mouth daily. Vita Fusion adult Gummie   polyethylene glycol powder (GLYCOLAX /MIRALAX ) 17 GM/SCOOP powder Take 17 g by mouth daily.   pravastatin  (PRAVACHOL ) 40 MG tablet Take 1 tablet (40 mg total) by mouth daily.   PREMPRO  0.45-1.5 MG tablet Take 1 tablet by mouth daily.   senna (SENOKOT) 8.6 MG TABS tablet Take 1 tablet (8.6 mg total) by mouth daily.   traZODone  (DESYREL ) 50 MG tablet Take 1 tablet (50 mg total) by mouth at bedtime.   [DISCONTINUED] DULoxetine  (CYMBALTA ) 20 MG capsule Take 1 capsule (20 mg total) by mouth daily for 5 days, THEN 2 capsules (40 mg total) daily.   [DISCONTINUED] pantoprazole  (PROTONIX ) 40 MG tablet Take 1 tablet (40 mg total) by mouth every morning.   No facility-administered encounter medications on file as of 09/29/2023.    Review of Systems:  Review of Systems  Health Maintenance  Topic Date Due   FOOT EXAM  Never done   OPHTHALMOLOGY EXAM  Never done   DEXA SCAN  Never done   Pneumococcal Vaccine: 50+ Years (2 of 2 - PCV) 05/01/2019   COVID-19 Vaccine (7 - 2024-25 season) 11/11/2023   INFLUENZA VACCINE  12/01/2023   Diabetic kidney evaluation - eGFR measurement  02/06/2024   Medicare Annual Wellness (AWV)  02/06/2024   HEMOGLOBIN A1C  04/02/2024   Diabetic kidney evaluation - Urine ACR  10/01/2024   MAMMOGRAM  04/12/2025   Colonoscopy  07/08/2028    DTaP/Tdap/Td (3 - Td or Tdap) 08/13/2031   Hepatitis C Screening  Completed   Zoster Vaccines- Shingrix  Completed   Hepatitis B Vaccines  Aged Out   HPV VACCINES  Aged Out   Meningococcal B Vaccine  Aged Out    Physical Exam: Vitals:   09/29/23 1055  BP: 136/64  Pulse: 92  Temp: (!) 96.2 F (35.7 C)  SpO2:  98%  Weight: 147 lb 3.2 oz (66.8 kg)  Height: 5' 3.5 (1.613 m)   Body mass index is 25.67 kg/m. Physical Exam Constitutional:      Appearance: Normal appearance.  Cardiovascular:     Rate and Rhythm: Normal rate and regular rhythm.     Pulses: Normal pulses.     Heart sounds: Normal heart sounds.  Pulmonary:     Effort: Pulmonary effort is normal.  Abdominal:     General: Abdomen is flat. Bowel sounds are normal.     Palpations: Abdomen is soft.  Musculoskeletal:        General: No swelling or tenderness.  Skin:    General: Skin is warm and dry.     Capillary Refill: Capillary refill takes 2 to 3 seconds.  Neurological:     Mental Status: She is alert and oriented to person, place, and time.     Gait: Gait abnormal.  Psychiatric:        Mood and Affect: Mood normal.    Physical Exam          Labs reviewed: Basic Metabolic Panel: Recent Labs    01/04/23 1602 02/06/23 1500  NA 134 131*  K 3.8 4.0  CL 98 97  CO2 22 26  GLUCOSE 100* 92  BUN 10 13  CREATININE 0.85 0.91  CALCIUM  8.9 9.2  PHOS  --  3.1  TSH  --  1.59   Liver Function Tests: Recent Labs    02/06/23 1500  AST 20  ALT 20  ALKPHOS 43  BILITOT 0.4  PROT 7.0  ALBUMIN 4.3  4.3   No results for input(s): LIPASE, AMYLASE in the last 8760 hours. No results for input(s): AMMONIA in the last 8760 hours. CBC: Recent Labs    02/06/23 1500  WBC 7.9  HGB 12.3  HCT 37.9  MCV 93.1  PLT 224.0   Lipid Panel: Recent Labs    02/06/23 1500 10/02/23 0748  CHOL 184 194  HDL 95.10 88  LDLCALC 77 87  TRIG 58.0 92  CHOLHDL 2 2.2   Lab Results  Component Value Date    HGBA1C 5.7 (H) 10/02/2023    Procedures since last visit: No results found. Results          Assessment/Plan   Knee pain and swelling Chronic knee pain with episodes of swelling. She is continuing physical therapy and has been evaluated by Dr. Kathi. Considering potential need for surgery if symptoms persist. She hopes to avoid surgery and is working on improving balance through exercises. - Continue physical therapy - Follow up with Dr. Kathi for further evaluation and potential surgical intervention  Constipation Chronic constipation managed with Senna. Recent episode of significant bowel movement after prolonged constipation, likely due to stool impaction. She experienced abdominal cramping similar to diverticulitis but resolved after bowel movement. - Continue Senna every other day to maintain regular bowel movements - Monitor bowel habits and adjust Senna frequency as needed  Diverticulitis History of diverticulitis with recent abdominal pain suggestive of a flare-up. Symptoms have resolved, likely due to resolution of stool impaction.  Incontinence Incontinence with recent discontinuation of solifenacin due to side effects. She reports increased frequency of urination but prefers this over previous side effects. She is managing without medication currently. - Reassess need for medication if urinary symptoms worsen  Dry eye syndrome Chronic dry eye syndrome managed with Systane Hydration eye drops and Hilooptase gel. She reports good symptom control with current regimen. - Continue  Systane Hydration eye drops four times daily - Continue Hilooptase gel at night      Medication management Medications filled and sent to pharmacy for delivery of medications in a pill pack to minimize confusion.     Labs/tests ordered:  * No order type specified * Next appt:  11/17/2023  I spent greater than 45 minutes for the care of this patient in face to face time, chart review,  clinical documentation, patient education.

## 2023-10-02 ENCOUNTER — Ambulatory Visit: Payer: Self-pay | Admitting: Student

## 2023-10-02 DIAGNOSIS — R1310 Dysphagia, unspecified: Secondary | ICD-10-CM

## 2023-10-03 LAB — MICROALBUMIN / CREATININE URINE RATIO
Creatinine, Urine: 122 mg/dL (ref 20–275)
Microalb Creat Ratio: 3 mg/g{creat} (ref <30)
Microalb, Ur: 0.4 mg/dL

## 2023-10-03 LAB — LIPID PANEL
Cholesterol: 194 mg/dL (ref <200)
HDL: 88 mg/dL (ref >=50)
LDL Cholesterol (Calc): 87 mg/dL
Non-HDL Cholesterol (Calc): 106 mg/dL (ref <130)
Total CHOL/HDL Ratio: 2.2 (calc) (ref <5.0)
Triglycerides: 92 mg/dL (ref <150)

## 2023-10-03 LAB — HEMOGLOBIN A1C
Hgb A1c MFr Bld: 5.7 % — ABNORMAL HIGH (ref <5.7)
Mean Plasma Glucose: 117 mg/dL
eAG (mmol/L): 6.5 mmol/L

## 2023-10-03 LAB — T3: T3, Total: 84 ng/dL (ref 76–181)

## 2023-10-03 LAB — VITAMIN D 25 HYDROXY (VIT D DEFICIENCY, FRACTURES): Vit D, 25-Hydroxy: 39 ng/mL (ref 30–100)
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
## 2023-10-16 ENCOUNTER — Telehealth: Payer: Self-pay | Admitting: Psychiatry

## 2023-10-16 ENCOUNTER — Ambulatory Visit: Admitting: Psychiatry

## 2023-10-16 NOTE — Telephone Encounter (Signed)
 Patient left voice message on 10-15-23 to cancel her appointment. She has found another provider with [facility].

## 2023-10-20 ENCOUNTER — Other Ambulatory Visit: Payer: Self-pay | Admitting: Student

## 2023-10-20 DIAGNOSIS — F331 Major depressive disorder, recurrent, moderate: Secondary | ICD-10-CM

## 2023-10-20 MED ORDER — DULOXETINE HCL 20 MG PO CPEP
20.0000 mg | ORAL_CAPSULE | Freq: Two times a day (BID) | ORAL | 0 refills | Status: DC
Start: 2023-10-20 — End: 2023-10-20

## 2023-10-20 MED ORDER — PANTOPRAZOLE SODIUM 40 MG PO TBEC: 40.0000 mg | DELAYED_RELEASE_TABLET | ORAL | 0 refills | Status: AC

## 2023-10-20 NOTE — Telephone Encounter (Signed)
 Medication pend and sent to PCP Valrie Gehrig, MD due to dose change from one capsule by mouth daily for 5 days, then two capsules by mouth daily.

## 2023-10-25 DIAGNOSIS — M545 Low back pain, unspecified: Secondary | ICD-10-CM | POA: Insufficient documentation

## 2023-10-30 ENCOUNTER — Telehealth: Payer: Self-pay | Admitting: *Deleted

## 2023-10-30 NOTE — Telephone Encounter (Signed)
 Copied from CRM [id number]. Topic: Clinical - Medical Advice >> Oct 27, 2023  1:42 PM Merlynn A wrote: Reason for CRM: Patient called in requesting medical advice from nurse or provider due to health complications Please contact patient back at [phone] .

## 2023-10-30 NOTE — Telephone Encounter (Signed)
 Called patient. She Stated that she is having Left Leg and Back pain. Had X-Rays done at [facility] and also seen them. Stated that it is getting harder to walk and she is requesting an MRI.   Told patient to call [facility]'s office and see if she could schedule an appointment to be seen by them since her pain has changed.  She agreed and stated that she will call them and schedule an appointment.  Instructed her to call our office back if she has any complications in getting an appointment. She agreed.    FYI

## 2023-11-02 ENCOUNTER — Encounter: Payer: Self-pay | Admitting: Gastroenterology

## 2023-11-08 ENCOUNTER — Ambulatory Visit: Payer: Self-pay

## 2023-11-08 ENCOUNTER — Ambulatory Visit: Admitting: Nurse Practitioner
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
## 2023-11-09 ENCOUNTER — Ambulatory Visit: Admitting: Neurosurgery

## 2023-11-10 ENCOUNTER — Ambulatory Visit: Admitting: Student

## 2023-11-10 ENCOUNTER — Encounter: Payer: Self-pay | Admitting: Student

## 2023-11-10 VITALS — BP 136/64 | HR 94 | Temp 98.1°F | Ht 63.5 in | Wt 146.2 lb

## 2023-11-10 DIAGNOSIS — G4733 Obstructive sleep apnea (adult) (pediatric): Secondary | ICD-10-CM

## 2023-11-10 DIAGNOSIS — N3281 Overactive bladder: Secondary | ICD-10-CM | POA: Diagnosis not present

## 2023-11-10 DIAGNOSIS — M25561 Pain in right knee: Secondary | ICD-10-CM | POA: Diagnosis not present

## 2023-11-10 DIAGNOSIS — M545 Low back pain, unspecified: Secondary | ICD-10-CM

## 2023-11-10 DIAGNOSIS — G8929 Other chronic pain: Secondary | ICD-10-CM

## 2023-11-10 DIAGNOSIS — M25551 Pain in right hip: Secondary | ICD-10-CM

## 2023-11-10 MED ORDER — TRAMADOL HCL 50 MG PO TABS: 50.0000 mg | ORAL_TABLET | Freq: Two times a day (BID) | ORAL | 0 refills | Status: DC | PRN

## 2023-11-10 NOTE — Patient Instructions (Addendum)
 VISIT SUMMARY:  During your visit, we discussed your worsening leg weakness and pain, particularly in your right leg, which has led to a fall. We reviewed your history of lumbar spine issues, including previous surgeries and recent diagnostic studies. We also addressed your urinary incontinence, knee pain, and other health concerns.  YOUR PLAN:  -LUMBAR SPINAL STENOSIS: Lumbar spinal stenosis is a condition where the spaces within your spine narrow, which can put pressure on the nerves that travel through the spine. You have significant narrowing in the lower lumbar region at L3, L4, L2, and L3, causing leg pain and numbness. We will proceed with a spinal injection scheduled with Dr. Etta on July 18th. After the injection, consider reinitiating physical therapy to manage pain and improve function. In the meantime, perform piriformis rehab exercises to alleviate muscle tension.  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 Your current symptoms include foot numbness and pain that worsens with certain positions.  -KNEE PAIN: You have knee pain, but it is currently a lower priority due to your more severe lumbar issues. We have an evaluation scheduled with Dr. Leora, but we may need to reschedule it to avoid conflict with your spinal injection on July 18th.  -URINARY INCONTINENCE: Urinary incontinence is the loss of bladder control. Your condition is currently controlled, but the medication solifenacin has caused dryness and a corneal issue.  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  -GENERAL HEALTH MAINTENANCE: Your routine colonoscopy is scheduled for July 24th. Continue with your general health maintenance and lifestyle recommendations as discussed.  INSTRUCTIONS:  - Follow up with Dr. Etta for your spinal injection on July 18th. -  Attempt to reschedule your knee evaluation with Dr. Leora to avoid conflict with the spinal injection. - Follow up with your colonoscopy on July 24th. - Reschedule your follow-up appointment with our office to a month later to assess your progress after these interventions.  Piriformis Syndrome Rehab Ask your health care provider which exercises are safe for you. Do exercises exactly as told by your health care provider and adjust them as directed. It is normal to feel mild stretching, pulling, tightness, or discomfort as you do these exercises. Stop right away if you feel sudden pain or your pain gets worse. Do not begin these exercises until told by your health care provider. Stretching and range-of-motion exercises These exercises warm up your muscles and joints and improve the movement and flexibility of your hip and pelvis. The exercises also help to relieve pain, numbness, and tingling. Nerve root  Sit on a firm surface that is high enough that you can swing your left / right foot freely. Place a folded towel under your left / right thigh. This is optional. Drop your head forward and round your back. While you keep your left / right foot relaxed, slowly straighten your left / right knee until you feel a slight pull behind your knee or calf. If your leg is fully extended and you still do not feel a pull, slowly tilt your foot and toes toward you. Hold this position for __________ seconds. Slowly return your knee to its starting position. Hip rotation This is an exercise in which you lie on your back and stretch the muscles that rotate your hip (hip rotators) to stretch your buttocks. Lie on your back on a firm surface.  Pull your left / right knee toward your same shoulder with your left / right hand until your knee is pointing toward the ceiling. Hold your left / right ankle with your other hand. Keeping your knee steady, gently pull your left / right ankle toward your other shoulder until  you feel a stretch in your buttocks. Hold this position for __________ seconds. Repeat __________ times. Complete this exercise __________ times a day. Hip extensor This is an exercise in which you lie on your back and pull your knee to your chest. Lie on your back on a firm surface. Both of your legs should be straight. Pull your left / right knee to your chest. Hold your leg in this position by holding on to the back of your thigh or the front of your knee. Hold this position for __________ seconds. Slowly return to the starting position. Repeat __________ times. Complete this exercise __________ times a day. Strengthening exercises These exercises build strength and endurance in your hip and thigh muscles. Endurance is the ability to use your muscles for a long time, even after they get tired. Straight leg raises, side-lying This exercise strengthens the muscles that rotate the leg at the hip and move it away from your body (hip abductors). Lie on your side with your left / right leg in the top position. Lie so your head, shoulder, knee, and hip line up. Bend your bottom knee to help you balance. Lift your top leg 4-6 inches (10-15 cm) while keeping your toes pointed straight ahead. Hold this position for __________ seconds. Slowly lower your leg to the starting position. Let your muscles relax completely after each repetition. Repeat __________ times. Complete this exercise __________ times a day. Hip abduction and rotation This is sometimes called quadruped (on hands and knees) exercises. Get on your hands and knees on a firm, lightly padded surface. Your hands should be directly below your shoulders, and your knees should be directly below your hips. Lift your left / right knee out to the side. Keep your knee bent. Do not twist your body. Hold this position for __________ seconds. Slowly lower your leg. Repeat __________ times. Complete this exercise __________ times a day. Straight  leg raises, prone This exercise stretches the muscles that move the hips (hip extensors). Lie on your abdomen on a firm surface (prone position). Tense the muscles in your buttocks and lift your left / right leg about 4 inches (10 cm). Keep your knee straight as you lift your leg. If you cannot lift your leg that high without arching your back, place a pillow under your hips. Hold this position for __________ seconds. Slowly lower your leg to the starting position. Let your muscles relax completely after each repetition. Repeat __________ times. Complete this exercise __________ times a day. This information is not intended to replace advice given to you by your health care provider. Make sure you discuss any questions you have with your health care provider. Document Revised: 10/20/2020 Document Reviewed: 10/20/2020 Elsevier Patient Education  2024 [employer].

## 2023-11-11 NOTE — Progress Notes (Unsigned)
 Location:  [facility] POS: [facility] Provider: ABDUL  Code Status: Full Code Goals of Care:     09/15/2023    8:47 AM  Advanced Directives  Does Patient Have a Medical Advance Directive? Yes  Type of Estate agent of [place];Living will  Does patient want to make changes to medical advance directive? No - Patient declined  Copy of Healthcare Power of Attorney in Chart? No - copy requested     Chief Complaint  Patient presents with   Andrea Noble a week ago and having leg pain. Questions regarding Calcium  tablets.     HPI: Patient is a 73 y.o. female seen today for medical management of chronic diseases.   Discussed the use of AI scribe software for clinical note transcription with the patient, who gave verbal consent to proceed.  History of Present Illness   Andrea Noble is a 73 year old female with lumbar spine issues who presents with worsening leg weakness and pain.  She has a history of lumbar spine issues, including previous lumbar fusion and spinal laminectomies. Recently, she has experienced worsening leg weakness and pain, particularly in her right leg, which has led to a fall. The leg gives out unexpectedly, causing her to fall to her knees. She also experiences numbness and pain radiating down her leg to her foot.  She has undergone various diagnostic studies, including x-rays and MRIs. The x-rays showed that two vertebrae are closer together than normal. MRI results showed narrowing in the lumbar region at L3-L4 and L2-L3, as reported to her. 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  She experiences urinary incontinence, which she manages with solifenacin. The medication has caused dryness, leading to a corneal issue. Her urinary symptoms have not significantly worsened recently.  She has a history of sciatica in the opposite leg, treated with surgery approximately three years  ago. She previously underwent physical therapy but stopped due to pain. She has also tried pain management techniques, including injections, with varying success.         Past Medical History:  Diagnosis Date   Anxiety    Arthritis    Asthma    Cataract    Chronic constipation    Chronic insomnia    Depression    Diverticulitis    GAD (generalized anxiety disorder)    GERD (gastroesophageal reflux disease)    Hodgkin's lymphoma (HCC) 2006   underwent radiation and chemo, last CT scan since moving to [place] from [place] 2018   Hyperlipidemia    Hypertension    Hypothyroidism    Menopausal syndrome    OAB (overactive bladder)    Paresthesia    Pneumonia    PSVT (paroxysmal supraventricular tachycardia) (HCC)    Seasonal allergies    Sleep apnea    SVT (supraventricular tachycardia) (HCC)    Thyroid  disease    hypothyroid    Past Surgical History:  Procedure Laterality Date   BACK SURGERY     basal cell carcinoma surgery     BLEPHAROPLASTY Bilateral    BREAST BIOPSY     benign   BREAST BIOPSY  07/27/2018   us  core bx venus COLUMNAR CELL METAPLASIA, AND PSEUDOANGIOMATOUS STROMAL   CARPAL TUNNEL RELEASE Right    CARPAL TUNNEL RELEASE Left    CATARACT EXTRACTION Bilateral 2015   CLEFT LIP REPAIR     COLON SURGERY  COLONOSCOPY WITH PROPOFOL  N/A 07/09/2018   Procedure: COLONOSCOPY WITH PROPOFOL ;  Surgeon: Gaylyn Gladis PENNER, MD;  Location: [facility] ENDOSCOPY;  Service: Endoscopy;  Laterality: N/A;   DE QUERVAIN'S RELEASE Bilateral    DIAGNOSTIC LAPAROSCOPY     endometriosis   ESOPHAGOGASTRODUODENOSCOPY N/A 07/09/2018   Procedure: ESOPHAGOGASTRODUODENOSCOPY (EGD);  Surgeon: Gaylyn Gladis PENNER, MD;  Location: [facility] ENDOSCOPY;  Service: Endoscopy;  Laterality: N/A;   EYE SURGERY     FINGER SURGERY Right    Middle finger joint replaced   JOINT REPLACEMENT     MAXIMUM ACCESS (MAS) TRANSFORAMINAL LUMBAR INTERBODY FUSION (TLIF) 2 LEVEL Right 07/06/2020   Procedure: L4/5, L5/S1  TRANSFORAMINAL LUMBAR INTERBODY FUSION (TLIF), L4-S1 PEDICLE SCREWS,DECOMPRESSION ;  Surgeon: Bluford Standing, MD;  Location: [facility] ORS;  Service: Neurosurgery;  Laterality: Right;   THUMB SURGERY Right    took tendon from forearm and wrapped it around the thumb joint    Allergies  Allergen Reactions   Levofloxacin     IV Levaquin causes burning   Other Itching    Surgical glue    Outpatient Encounter Medications as of 11/10/2023  Medication Sig   acetaminophen  (TYLENOL ) 650 MG CR tablet Take 1 tablet (650 mg total) by mouth at bedtime.   azelastine  (ASTELIN ) 0.1 % nasal spray USE 2 SPRAYS IN EACH NOSTRIL TWICE A DAY AS DIRECTED   busPIRone  (BUSPAR ) 15 MG tablet Take 1 tablet (15 mg total) by mouth 2 (two) times daily.   Calcium  Carbonate-Vit D-Min (CALTRATE MINIS PLUS MINERALS PO) Take 40 mcg by mouth daily.   Carboxymethylcellulose Sodium (THERATEARS) 0.25 % SOLN Place 1 drop into both eyes daily as needed (Dry eyes).   diclofenac  Sodium (VOLTAREN ) 1 % GEL Apply 1 application topically 2 (two) times daily.   diltiazem  (CARDIZEM  CD) 120 MG 24 hr capsule Take 1 capsule (120 mg total) by mouth daily.   DULoxetine  (CYMBALTA ) 20 MG capsule Take 2 capsules (40 mg total) by mouth daily.   fluocinonide  cream (LIDEX ) 0.05 % Apply 1 application topically daily as needed (Eczema).   fluticasone -salmeterol (WIXELA INHUB) 100-50 MCG/ACT AEPB Inhale 1 puff into the lungs 2 (two) times daily.   gabapentin  (NEURONTIN ) 100 MG capsule Take 1 capsule (100 mg total) by mouth daily as needed.   levothyroxine  (SYNTHROID ) 50 MCG tablet Take 1-1.5 tablets (50-75 mcg total) by mouth See admin instructions. Take 75 mg every  Monday, Wednesday and Friday Take 50 mg all the other days   liothyronine  (CYTOMEL ) 5 MCG tablet Take 0.5 tablets (2.5 mcg total) by mouth daily.   Melatonin 10 MG TABS Take 10 mg by mouth at bedtime. Timed-Release   montelukast  (SINGULAIR ) 10 MG tablet Take 1 tablet (10 mg total) by mouth at  bedtime.   Multiple Vitamin (MULTIVITAMIN) tablet Take 1 tablet by mouth daily. Vita Fusion adult Gummie   pantoprazole  (PROTONIX ) 40 MG tablet Take 1 tablet (40 mg total) by mouth every morning.   Polyethyl Glycol-Propyl Glycol (SYSTANE) 0.4-0.3 % GEL ophthalmic gel Place 1 application  into both eyes at bedtime.   polyethylene glycol powder (GLYCOLAX /MIRALAX ) 17 GM/SCOOP powder Take 17 g by mouth daily.   pravastatin  (PRAVACHOL ) 40 MG tablet Take 1 tablet (40 mg total) by mouth daily.   PREMPRO  0.45-1.5 MG tablet Take 1 tablet by mouth daily.   senna (SENOKOT) 8.6 MG TABS tablet Take 1 tablet (8.6 mg total) by mouth daily.   sodium chloride  (OCEAN) 0.65 % SOLN nasal spray Place 1 spray into both nostrils 2 (  two) times daily.   traZODone  (DESYREL ) 50 MG tablet Take 1 tablet (50 mg total) by mouth at bedtime.   White Petrolatum-Mineral Oil (LUBRICANT PM) OINT Apply 1 g to eye at bedtime.   [DISCONTINUED] traMADol  (ULTRAM ) 50 MG tablet Take 50 mg by mouth every 6 (six) hours as needed.   traMADol  (ULTRAM ) 50 MG tablet Take 1 tablet (50 mg total) by mouth every 12 (twelve) hours as needed.   No facility-administered encounter medications on file as of 11/10/2023.    Review of Systems:  Review of Systems  Health Maintenance  Topic Date Due   FOOT EXAM  Never done   OPHTHALMOLOGY EXAM  Never done   DEXA SCAN  Never done   Pneumococcal Vaccine: 50+ Years (2 of 2 - PCV) 05/01/2019   COVID-19 Vaccine (7 - 2024-25 season) 11/11/2023   INFLUENZA VACCINE  12/01/2023   Diabetic kidney evaluation - eGFR measurement  02/06/2024   Medicare Annual Wellness (AWV)  02/06/2024   HEMOGLOBIN A1C  04/02/2024   Diabetic kidney evaluation - Urine ACR  10/01/2024   MAMMOGRAM  04/12/2025   Colonoscopy  07/08/2028   DTaP/Tdap/Td (3 - Td or Tdap) 08/13/2031   Hepatitis C Screening  Completed   Zoster Vaccines- Shingrix  Completed   Hepatitis B Vaccines  Aged Out   HPV VACCINES  Aged Out   Meningococcal B  Vaccine  Aged Out    Physical Exam: Vitals:   11/10/23 1025  BP: 136/64  Pulse: 94  Temp: 98.1 F (36.7 C)  SpO2: 98%  Weight: 146 lb 3.2 oz (66.3 kg)  Height: 5' 3.5 (1.613 m)   Body mass index is 25.49 kg/m. Physical Exam Constitutional:      Appearance: Normal appearance.  Cardiovascular:     Rate and Rhythm: Normal rate.     Pulses: Normal pulses.  Pulmonary:     Effort: Pulmonary effort is normal.  Neurological:     Mental Status: She is alert.    Physical Exam   MUSCULOSKELETAL: Tenderness in buttock. NEUROLOGICAL: Proprioception intact in toes. Pain exacerbated by straight leg raise.      Labs reviewed: Basic Metabolic Panel: Recent Labs    01/04/23 1602 02/06/23 1500  NA 134 131*  K 3.8 4.0  CL 98 97  CO2 22 26  GLUCOSE 100* 92  BUN 10 13  CREATININE 0.85 0.91  CALCIUM  8.9 9.2  PHOS  --  3.1  TSH  --  1.59   Liver Function Tests: Recent Labs    02/06/23 1500  AST 20  ALT 20  ALKPHOS 43  BILITOT 0.4  PROT 7.0  ALBUMIN 4.3  4.3   No results for input(s): LIPASE, AMYLASE in the last 8760 hours. No results for input(s): AMMONIA in the last 8760 hours. CBC: Recent Labs    02/06/23 1500  WBC 7.9  HGB 12.3  HCT 37.9  MCV 93.1  PLT 224.0   Lipid Panel: Recent Labs    02/06/23 1500 10/02/23 0748  CHOL 184 194  HDL 95.10 88  LDLCALC 77 87  TRIG 58.0 92  CHOLHDL 2 2.2   Lab Results  Component Value Date   HGBA1C 5.7 (H) 10/02/2023    Procedures since last visit: No results found. Results   RADIOLOGY MRI cervical spine: No high-grade central stenosis (11/06/2023) MRI lumbar spine: Significant stenosis at L3-L4, L2-L3 (11/06/2023) X-ray spine: Reduced intervertebral disc space  DIAGNOSTIC Nerve conduction study: Sensory loss in right leg  Assessment/Plan    Lumbar spinal stenosis Significant stenosis in the lower lumbar region at L3, L4, L2, L3 with symptoms of leg pain and numbness, and a recent fall due  to leg instability. MRI shows no dangerous nerve compression. - Proceed with spinal injection scheduled with Dr. Etta on July 18th. - Consider reinitiating physical therapy post-spinal injection to manage pain and improve function. - Provide piriformis rehab exercises to alleviate muscle tension until physical therapy resumes.  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 Current symptoms include foot numbness and position-exacerbated pain.  Knee pain Knee pain present but deprioritized due to more severe lumbar issues. Evaluation by Dr. Leora is scheduled but may need rescheduling due to conflicting appointments. - Attempt to reschedule Dr. Leora' appointment to avoid conflict with the spinal injection on July 18th.  Urinary incontinence Urinary incontinence is present but controlled. Solifenacin previously used caused adverse effects. Eye dryness led to corneal abrasion.   General Health Maintenance Colonoscopy scheduled for July 24th.  Follow-up Coordination of multiple follow-up appointments and procedures is necessary to manage care effectively. - Follow up with Dr. Etta for spinal injection on July 18th. - Attempt to reschedule Dr. Leora' knee evaluation to avoid conflict with the spinal injection. - Follow up with colonoscopy on July 24th. - Reschedule follow-up appointment with current provider to a month later to assess progress post-interventions.          Labs/tests ordered:  * No order type specified * Next appt:  12/22/2023   I spent greater than 50 minutes for the care of this patient in face to face time, chart review, clinical documentation, patient education.

## 2023-11-12 ENCOUNTER — Encounter: Payer: Self-pay | Admitting: Student

## 2023-11-17 ENCOUNTER — Encounter: Admitting: Student

## 2023-11-17 NOTE — Telephone Encounter (Unsigned)
 Copied from CRM [id number]. Topic: General - Other >> Nov 17, 2023 11:43 AM Berneda FALCON wrote: Reason for CRM: Jerel from [facility] states that she sent over a PT Recert for this patient on 6/26 and 7/17 and got a callback this morning stating that this was a [place] patient, however Dr. Jimmy is the one that signed the original PT. It looks like she sees both PCP's. She states Dr. Jimmy needs to be the one to sign the recert please.  Terry's callback [phone] Fax is [phone]

## 2023-11-17 NOTE — Telephone Encounter (Signed)
 I called and spoke to Andrea Noble that Dr Jimmy is retiring next month. This is no longer a pt of his and the new orders need to go to Dr Abdul.

## 2023-11-20 ENCOUNTER — Other Ambulatory Visit: Payer: Self-pay | Admitting: Student

## 2023-11-20 DIAGNOSIS — F331 Major depressive disorder, recurrent, moderate: Secondary | ICD-10-CM

## 2023-11-20 NOTE — Telephone Encounter (Signed)
 Medication has Warning , pended medication and sent to Abdul Fine, MD for further verification.

## 2023-11-23 ENCOUNTER — Ambulatory Visit: Admitting: Anesthesiology

## 2023-11-23 ENCOUNTER — Encounter
Admission: RE | Disposition: A | Discharge status: Home / Self Care | Payer: Self-pay | Source: Home / Self Care | Attending: Gastroenterology

## 2023-11-23 ENCOUNTER — Other Ambulatory Visit: Payer: Self-pay

## 2023-11-23 ENCOUNTER — Ambulatory Visit
Admission: RE | Admit: 2023-11-23 | Discharge: 2023-11-23 | Disposition: A | Discharge status: Home / Self Care | Attending: Gastroenterology | Admitting: Gastroenterology

## 2023-11-23 ENCOUNTER — Encounter: Payer: Self-pay | Admitting: Gastroenterology

## 2023-11-23 DIAGNOSIS — K5730 Diverticulosis of large intestine without perforation or abscess without bleeding: Secondary | ICD-10-CM | POA: Diagnosis not present

## 2023-11-23 DIAGNOSIS — K2950 Unspecified chronic gastritis without bleeding: Secondary | ICD-10-CM | POA: Insufficient documentation

## 2023-11-23 DIAGNOSIS — K640 First degree hemorrhoids: Secondary | ICD-10-CM | POA: Diagnosis not present

## 2023-11-23 DIAGNOSIS — Z1211 Encounter for screening for malignant neoplasm of colon: Secondary | ICD-10-CM | POA: Insufficient documentation

## 2023-11-23 DIAGNOSIS — K621 Rectal polyp: Secondary | ICD-10-CM | POA: Insufficient documentation

## 2023-11-23 DIAGNOSIS — K317 Polyp of stomach and duodenum: Secondary | ICD-10-CM | POA: Insufficient documentation

## 2023-11-23 HISTORY — DX: Generalized anxiety disorder: F41.1

## 2023-11-23 HISTORY — DX: Psychophysiologic insomnia: F51.04

## 2023-11-23 HISTORY — DX: Essential (primary) hypertension: I10

## 2023-11-23 HISTORY — DX: Sleep apnea, unspecified: G47.30

## 2023-11-23 HISTORY — DX: Other constipation: K59.09

## 2023-11-23 HISTORY — PX: ESOPHAGOGASTRODUODENOSCOPY: SHX5428

## 2023-11-23 HISTORY — DX: Supraventricular tachycardia, unspecified: I47.10

## 2023-11-23 HISTORY — DX: Paresthesia of skin: R20.2

## 2023-11-23 HISTORY — PX: COLONOSCOPY: SHX5424

## 2023-11-23 HISTORY — PX: POLYPECTOMY: SHX149

## 2023-11-23 SURGERY — COLONOSCOPY
Anesthesia: General

## 2023-11-23 MED ORDER — SODIUM CHLORIDE 0.9 % IV SOLN: INTRAVENOUS | Status: DC

## 2023-11-23 MED ORDER — PROPOFOL 10 MG/ML IV BOLUS
INTRAVENOUS | Status: DC | PRN
  Administered 2023-11-23: 30 mg via INTRAVENOUS
  Administered 2023-11-23: 40 mg via INTRAVENOUS
  Administered 2023-11-23: 60 mg via INTRAVENOUS

## 2023-11-23 MED ORDER — DEXMEDETOMIDINE HCL IN NACL 80 MCG/20ML IV SOLN
INTRAVENOUS | Status: DC | PRN
  Administered 2023-11-23: 4 ug via INTRAVENOUS
  Administered 2023-11-23: 8 ug via INTRAVENOUS

## 2023-11-23 MED ORDER — PROPOFOL 500 MG/50ML IV EMUL
INTRAVENOUS | Status: DC | PRN
  Administered 2023-11-23: 140 ug/kg/min via INTRAVENOUS

## 2023-11-23 MED ORDER — LIDOCAINE HCL (CARDIAC) PF 100 MG/5ML IV SOSY
PREFILLED_SYRINGE | INTRAVENOUS | Status: DC | PRN
  Administered 2023-11-23: 60 mg via INTRAVENOUS

## 2023-11-23 NOTE — Progress Notes (Signed)
 Patient:Andrea Noble PHONES THAT SHE CONTINUES TO HAVE SOME ABDOMINAL CRAMPING SINCE THE PROCEDURE.SHE STATES THAT SHE HAS BLOOD IN THE STOOL-APPROXIMATELY 1 TABLESPOON.THE WATER IS NOT RED BUT SHE DID NOT KNOW IF SHE SHOULD BE WORRIED.I TOLD PATIENT TO OBSERVE BOWEL MOVEMENTS FOR BLOOD OR IF CRAMPING BECOMES MORE SEVERE SHE SHOULD CALL BACK AND TALK WITH DR.RUSSO.

## 2023-11-23 NOTE — Op Note (Signed)
 [facility] Patient Name: Andrea Noble Procedure Date: 11/23/2023 9:10 AM MRN: [id number] Account #: [id number] Date of Birth: 24-Oct-1950 Admit Type: Outpatient Age: 73 Room: [facility] ENDO ROOM 1 Gender: Female Note Status: Finalized Instrument Name: Upper Endoscope [phone] Procedure:             Upper GI endoscopy Indications:           Gastro-esophageal reflux disease, Globus sensation Providers:             [name] DO, DO Medicines:             Monitored Anesthesia Care Complications:         No immediate complications. Estimated blood loss:                         Minimal. Procedure:             Pre-Anesthesia Assessment:                        - Prior to the procedure, a History and Physical was                         performed, and patient medications and allergies were                         reviewed. The patient is competent. The risks and                         benefits of the procedure and the sedation options and                         risks were discussed with the patient. All questions                         were answered and informed consent was obtained.                         Patient identification and proposed procedure were                         verified by the physician, the nurse, the anesthetist                         and the technician in the endoscopy suite. Mental                         Status Examination: alert and oriented. Airway                         Examination: normal oropharyngeal airway and neck                         mobility. Respiratory Examination: clear to                         auscultation. CV Examination: RRR, no murmurs, no S3                         or S4. Prophylactic Antibiotics: The  patient does not                         require prophylactic antibiotics. Prior                         Anticoagulants: The patient has taken no anticoagulant                         or antiplatelet  agents. ASA Grade Assessment: II - A                         patient with mild systemic disease. After reviewing                         the risks and benefits, the patient was deemed in                         satisfactory condition to undergo the procedure. The                         anesthesia plan was to use monitored anesthesia care                         (MAC). Immediately prior to administration of                         medications, the patient was re-assessed for adequacy                         to receive sedatives. The heart rate, respiratory                         rate, oxygen saturations, blood pressure, adequacy of                         pulmonary ventilation, and response to care were                         monitored throughout the procedure. The physical                         status of the patient was re-assessed after the                         procedure.                        After obtaining informed consent, the endoscope was                         passed under direct vision. Throughout the procedure,                         the patient's blood pressure, pulse, and oxygen                         saturations were monitored continuously. The Endoscope  was introduced through the mouth, and advanced to the                         second part of duodenum. The upper GI endoscopy was                         accomplished without difficulty. The patient tolerated                         the procedure well. Findings:      The duodenal bulb, first portion of the duodenum and second portion of       the duodenum were normal. Estimated blood loss: none.      Multiple 1 to 6 mm sessile polyps with bleeding and stigmata of recent       bleeding were found in the gastric body. The polyp was removed with a       cold biopsy forceps. Resection and retrieval were complete. Estimated       blood loss was minimal.      The exam of the stomach was otherwise  normal.      The Z-line was regular. Biopsies were taken with a cold forceps for       histology. Biopsy taken due to previously reported barrets esophagus,       although the z line appeared regular today. NBI used as well. Estimated       blood loss was minimal.      Esophagogastric landmarks were identified: the gastroesophageal junction       was found at 38 cm from the incisors.      No endoscopic abnormality was evident in the esophagus to explain the       patient's complaint of dysphagia. Estimated blood loss: none.      The exam of the esophagus was otherwise normal. Impression:            - Normal duodenal bulb, first portion of the duodenum                         and second portion of the duodenum.                        - Multiple gastric polyps. Resected and retrieved.                        - Z-line regular. Biopsied.                        - Esophagogastric landmarks identified.                        - No endoscopic esophageal abnormality to explain                         patient's dysphagia. Recommendation:        - Patient has a contact number available for                         emergencies. The signs and symptoms of potential                         delayed complications  were discussed with the patient.                         Return to normal activities tomorrow. Written                         discharge instructions were provided to the patient.                        - Discharge patient to home.                        - Resume previous diet.                        - Continue present medications.                        - Await pathology results.                        - Consider MBSS or M&M study if dysphagia continues.                         No endoscopic evidence as etiology for dysphagia. Pt                         reports symptoms above the supraclavicular notch more                         consistent with globus.                        - Return to GI  clinic as previously scheduled.                        - The findings and recommendations were discussed with                         the patient. Procedure Code(s):     --- Professional ---                        [phone], Esophagogastroduodenoscopy, flexible,                         transoral; with biopsy, single or multiple Diagnosis Code(s):     --- Professional ---                        K31.7, Polyp of stomach and duodenum                        R13.10, Dysphagia, unspecified                        K21.9, Gastro-esophageal reflux disease without                         esophagitis                        F45.8, Other somatoform disorders CPT copyright 2022 American Medical  Association. All rights reserved. The codes documented in this report are preliminary and upon coder review may  be revised to meet current compliance requirements. Attending Participation:      I personally performed the entire procedure. Elspeth Jungling, DO [name] DO, DO 11/23/2023 9:52:06 AM This report has been signed electronically. Number of Addenda: 0 Note Initiated On: 11/23/2023 9:10 AM Estimated Blood Loss:  Estimated blood loss was minimal.      [facility]

## 2023-11-23 NOTE — Transfer of Care (Signed)
 Immediate Anesthesia Transfer of Care Note  Patient: Andrea Noble  Procedure(s) Performed: COLONOSCOPY EGD (ESOPHAGOGASTRODUODENOSCOPY) POLYPECTOMY, INTESTINE  Patient Location: PACU  Anesthesia Type:General  Level of Consciousness: awake, alert , and oriented  Airway & Oxygen Therapy: Patient Spontanous Breathing  Post-op Assessment: Report given to RN and Post -op Vital signs reviewed and stable  Post vital signs: Reviewed and stable  Last Vitals:  Vitals Value Taken Time  BP 103/39 11/23/23 10:16  Temp 36.2 C 11/23/23 10:16  Pulse 94 11/23/23 10:18  Resp 19 11/23/23 10:18  SpO2 99 % 11/23/23 10:18  Vitals shown include unfiled device data.  Last Pain:  Vitals:   11/23/23 1016  TempSrc: Temporal  PainSc: Asleep         Complications: No notable events documented.

## 2023-11-23 NOTE — Interval H&P Note (Signed)
 History and Physical Interval Note: Preprocedure H&P from 11/23/23  was reviewed and there was no interval change after seeing and examining the patient.  Written consent was obtained from the patient after discussion of risks, benefits, and alternatives. Patient has consented to proceed with Esophagogastroduodenoscopy and Colonoscopy with possible intervention   11/23/2023 9:24 AM  Andrea Noble  has presented today for surgery, with the diagnosis of Globus sensation [R09.A2] History of Barrett's esophagus [Z87.19] Hx of adenomatous colonic polyps [Z86.0101] GERD with esophagitis without hemorrhage [K21.00].  The various methods of treatment have been discussed with the patient and family. After consideration of risks, benefits and other options for treatment, the patient has consented to  Procedure(s) with comments: COLONOSCOPY (N/A) - DM EGD (ESOPHAGOGASTRODUODENOSCOPY) (N/A) as a surgical intervention.  The patient's history has been reviewed, patient examined, no change in status, stable for surgery.  I have reviewed the patient's chart and labs.  Questions were answered to the patient's satisfaction.     [name]

## 2023-11-23 NOTE — Anesthesia Preprocedure Evaluation (Signed)
 Anesthesia Evaluation  Patient identified by MRN, date of birth, ID band Patient awake    Reviewed: Allergy & Precautions, NPO status , Patient's Chart, lab work & pertinent test results  History of Anesthesia Complications Negative for: history of anesthetic complications  Airway Mallampati: II   Neck ROM: Full    Dental  (+) Implants   Pulmonary asthma , sleep apnea    Pulmonary exam normal breath sounds clear to auscultation       Cardiovascular hypertension, Normal cardiovascular exam+ dysrhythmias Supra Ventricular Tachycardia  Rhythm:Regular Rate:Normal     Neuro/Psych  PSYCHIATRIC DISORDERS Anxiety Depression       GI/Hepatic ,GERD  ,,  Endo/Other    Renal/GU      Musculoskeletal   Abdominal   Peds  Hematology Hodgkin lymphoma 2006   Anesthesia Other Findings   Reproductive/Obstetrics                              Anesthesia Physical Anesthesia Plan  ASA: 2  Anesthesia Plan: General   Post-op Pain Management:    Induction: Intravenous  PONV Risk Score and Plan: 3 and Propofol  infusion, TIVA and Treatment may vary due to age or medical condition  Airway Management Planned: Natural Airway  Additional Equipment:   Intra-op Plan:   Post-operative Plan:   Informed Consent: I have reviewed the patients History and Physical, chart, labs and discussed the procedure including the risks, benefits and alternatives for the proposed anesthesia with the patient or authorized representative who has indicated his/her understanding and acceptance.       Plan Discussed with: CRNA  Anesthesia Plan Comments: (LMA/GETA backup discussed.  Patient consented for risks of anesthesia including but not limited to:  - adverse reactions to medications - damage to eyes, teeth, lips or other oral mucosa - nerve damage due to positioning  - sore throat or hoarseness - damage to heart, brain,  nerves, lungs, other parts of body or loss of life  Informed patient about role of CRNA in peri- and intra-operative care.  Patient voiced understanding.)        Anesthesia Quick Evaluation
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
## 2023-11-23 NOTE — Op Note (Signed)
 [facility] Patient Name: Andrea Noble Procedure Date: 11/23/2023 9:08 AM MRN: [id number] Account #: [id number] Date of Birth: 04-Jul-1950 Admit Type: Outpatient Age: 73 Room: [facility] ENDO ROOM 1 Gender: Female Note Status: Finalized Instrument Name: Peds Colonoscope [id number] Procedure:             Colonoscopy Indications:           High risk colon cancer surveillance: Personal history                         of colonic polyps Providers:             [name] DO, DO Medicines:             Monitored Anesthesia Care Complications:         No immediate complications. Estimated blood loss:                         Minimal. Procedure:             Pre-Anesthesia Assessment:                        - Prior to the procedure, a History and Physical was                         performed, and patient medications and allergies were                         reviewed. The patient is competent. The risks and                         benefits of the procedure and the sedation options and                         risks were discussed with the patient. All questions                         were answered and informed consent was obtained.                         Patient identification and proposed procedure were                         verified by the physician, the nurse, the anesthetist                         and the technician in the endoscopy suite. Mental                         Status Examination: alert and oriented. Airway                         Examination: normal oropharyngeal airway and neck                         mobility. Respiratory Examination: clear to                         auscultation. CV Examination: RRR, no murmurs, no S3  or S4. Prophylactic Antibiotics: The patient does not                         require prophylactic antibiotics. Prior                         Anticoagulants: The patient has taken no anticoagulant                          or antiplatelet agents. ASA Grade Assessment: II - A                         patient with mild systemic disease. After reviewing                         the risks and benefits, the patient was deemed in                         satisfactory condition to undergo the procedure. The                         anesthesia plan was to use monitored anesthesia care                         (MAC). Immediately prior to administration of                         medications, the patient was re-assessed for adequacy                         to receive sedatives. The heart rate, respiratory                         rate, oxygen saturations, blood pressure, adequacy of                         pulmonary ventilation, and response to care were                         monitored throughout the procedure. The physical                         status of the patient was re-assessed after the                         procedure.                        After obtaining informed consent, the colonoscope was                         passed under direct vision. Throughout the procedure,                         the patient's blood pressure, pulse, and oxygen                         saturations were monitored continuously. The  Colonoscope was introduced through the anus and                         advanced to the the terminal ileum, with                         identification of the appendiceal orifice and IC                         valve. The colonoscopy was performed without                         difficulty. The patient tolerated the procedure well.                         The quality of the bowel preparation was evaluated                         using the BBPS [facility] Bowel Preparation Scale) with                         scores of: Right Colon = 3, Transverse Colon = 3 and                         Left Colon = 3 (entire mucosa seen well with no                         residual  staining, small fragments of stool or opaque                         liquid). The total BBPS score equals 9. The terminal                         ileum, ileocecal valve, appendiceal orifice, and                         rectum were photographed. Findings:      The perianal and digital rectal examinations were normal. Pertinent       negatives include normal sphincter tone.      The terminal ileum appeared normal. Estimated blood loss: none.      Multiple small-mouthed diverticula were found in the entire colon.       Estimated blood loss: none.      Non-bleeding internal hemorrhoids were found during retroflexion. The       hemorrhoids were Grade I (internal hemorrhoids that do not prolapse).       Estimated blood loss: none.      A 3 to 4 mm polyp was found in the rectum. The polyp was sessile. The       polyp was removed with a cold snare. Resection and retrieval were       complete. Estimated blood loss was minimal. To prevent bleeding after       the polypectomy, one hemostatic clip was successfully placed (MR       conditional). There was no bleeding at the end of the procedure.       Estimated blood loss: none.      The exam was otherwise without abnormality on direct and retroflexion  views. Impression:            - The examined portion of the ileum was normal.                        - Diverticulosis in the entire examined colon.                        - Non-bleeding internal hemorrhoids.                        - One 3 to 4 mm polyp in the rectum, removed with a                         cold snare. Resected and retrieved. Clip (MR                         conditional) was placed.                        - The examination was otherwise normal on direct and                         retroflexion views. Recommendation:        - Patient has a contact number available for                         emergencies. The signs and symptoms of potential                         delayed  complications were discussed with the patient.                         Return to normal activities tomorrow. Written                         discharge instructions were provided to the patient.                        - Discharge patient to home.                        - Resume previous diet.                        - Continue present medications.                        - No ibuprofen, naproxen, or other non-steroidal                         anti-inflammatory drugs for 5 days after polyp removal.                        - Await pathology results.                        - Repeat colonoscopy for surveillance based on                         pathology results.                        -  Return to referring physician as previously                         scheduled.                        - The findings and recommendations were discussed with                         the patient. Procedure Code(s):     --- Professional ---                        [id number], Colonoscopy, flexible; with removal of                         tumor(s), polyp(s), or other lesion(s) by snare                         technique Diagnosis Code(s):     --- Professional ---                        Z86.010, Personal history of colonic polyps                        K64.0, First degree hemorrhoids                        D12.8, Benign neoplasm of rectum                        K57.30, Diverticulosis of large intestine without                         perforation or abscess without bleeding CPT copyright 2022 [employer] Association. All rights reserved. The codes documented in this report are preliminary and upon coder review may  be revised to meet current compliance requirements. Attending Participation:      I personally performed the entire procedure. Elspeth Jungling, DO [name] DO, DO 11/23/2023 10:16:28 AM This report has been signed electronically. Number of Addenda: 0 Note Initiated On: 11/23/2023 9:08 AM Scope  Withdrawal Time: 0 hours 11 minutes 30 seconds  Total Procedure Duration: 0 hours 18 minutes 53 seconds  Estimated Blood Loss:  Estimated blood loss was minimal.      [facility]

## 2023-11-23 NOTE — Anesthesia Postprocedure Evaluation (Signed)
 Anesthesia Post Note  Patient: Andrea Noble  Procedure(s) Performed: COLONOSCOPY EGD (ESOPHAGOGASTRODUODENOSCOPY) POLYPECTOMY, INTESTINE  Patient location during evaluation: PACU Anesthesia Type: General Level of consciousness: awake and alert, oriented and patient cooperative Pain management: pain level controlled Vital Signs Assessment: post-procedure vital signs reviewed and stable Respiratory status: spontaneous breathing, nonlabored ventilation and respiratory function stable Cardiovascular status: blood pressure returned to baseline and stable Postop Assessment: adequate PO intake Anesthetic complications: no   No notable events documented.   Last Vitals:  Vitals:   11/23/23 1016 11/23/23 1026  BP: (!) 103/39 (!) 125/54  Pulse: 92 94  Resp: 10 (!) 22  Temp: (!) 36.2 C   SpO2: 100% 100%    Last Pain:  Vitals:   11/23/23 1016  TempSrc: Temporal  PainSc: Asleep                 Alfonso Ruths

## 2023-11-24 ENCOUNTER — Encounter: Payer: Self-pay | Admitting: Gastroenterology

## 2023-11-24 ENCOUNTER — Emergency Department
Admission: EM | Admit: 2023-11-24 | Discharge: 2023-11-24 | Disposition: A | Discharge status: Home / Self Care | Attending: Emergency Medicine | Admitting: Emergency Medicine

## 2023-11-24 ENCOUNTER — Other Ambulatory Visit: Payer: Self-pay

## 2023-11-24 DIAGNOSIS — R000 Tachycardia, unspecified: Secondary | ICD-10-CM | POA: Diagnosis not present

## 2023-11-24 DIAGNOSIS — K625 Hemorrhage of anus and rectum: Secondary | ICD-10-CM | POA: Diagnosis present

## 2023-11-24 LAB — CBC
HCT: 37.4 % (ref 36.0–46.0)
Hemoglobin: 12.4 g/dL (ref 12.0–15.0)
MCH: 30.7 pg (ref 26.0–34.0)
MCHC: 33.2 g/dL (ref 30.0–36.0)
MCV: 92.6 fL (ref 80.0–100.0)
Platelets: 381 K/uL (ref 150–400)
RBC: 4.04 MIL/uL (ref 3.87–5.11)
RDW: 14.8 % (ref 11.5–15.5)
WBC: 9.7 K/uL (ref 4.0–10.5)
nRBC: 0 % (ref 0.0–0.2)

## 2023-11-24 LAB — COMPREHENSIVE METABOLIC PANEL WITH GFR
ALT: 24 U/L (ref 0–44)
AST: 28 U/L (ref 15–41)
Albumin: 3.8 g/dL (ref 3.5–5.0)
Alkaline Phosphatase: 118 U/L (ref 38–126)
Anion gap: 9 (ref 5–15)
BUN: 13 mg/dL (ref 8–23)
CO2: 23 mmol/L (ref 22–32)
Calcium: 8.9 mg/dL (ref 8.9–10.3)
Chloride: 103 mmol/L (ref 98–111)
Creatinine, Ser: 0.59 mg/dL (ref 0.44–1.00)
GFR, Estimated: >60 mL/min (ref >60)
Glucose, Bld: 111 mg/dL — ABNORMAL HIGH (ref 70–99)
Potassium: 3.7 mmol/L (ref 3.5–5.1)
Sodium: 135 mmol/L (ref 135–145)
Total Bilirubin: 0.5 mg/dL (ref 0.0–1.2)
Total Protein: 6.9 g/dL (ref 6.5–8.1)

## 2023-11-24 LAB — TYPE AND SCREEN
ABO/RH(D): O POS
Antibody Screen: NEGATIVE

## 2023-11-24 LAB — SURGICAL PATHOLOGY

## 2023-11-24 NOTE — ED Provider Notes (Signed)
 [facility] Provider Note    Event Date/Time   First MD Initiated Contact with Patient 11/24/23 1855     (approximate)   History   Rectal Bleeding   HPI  Andrea Noble is a 73 year old female with history of GERD, Barrett's esophagus presenting to the emergency department for evaluation of rectal bleeding.  Patient underwent EGD and colonoscopy yesterday.  She notes that today she had passage of a moderate-sized clot in the toilet.  Since that time she has had 3-4 episodes of formed bowel movements with pinkish discoloration in the toilet bowl.  Reports some generalized abdominal cramping throughout the day as well.  I reviewed her colonoscopy report.  This demonstrated diverticulosis, nonbleeding internal hemorrhoids, rectal polyp that was removed.  Her EGD demonstrated polyps within the gastric body with evidence of bleeding that were removed.       Physical Exam   Triage Vital Signs: ED Triage Vitals  Encounter Vitals Group     BP 11/24/23 1843 (!) 155/55     Girls Systolic BP Percentile --      Girls Diastolic BP Percentile --      Boys Systolic BP Percentile --      Boys Diastolic BP Percentile --      Pulse Rate 11/24/23 1843 (!) 108     Resp 11/24/23 1843 20     Temp 11/24/23 1843 98.2 F (36.8 C)     Temp Source 11/24/23 1843 Oral     SpO2 11/24/23 1843 100 %     Weight 11/24/23 1841 141 lb 1.5 oz (64 kg)     Height --      Head Circumference --      Peak Flow --      Pain Score 11/24/23 1841 4     Pain Loc --      Pain Education --      Exclude from Growth Chart --     Most recent vital signs: Vitals:   11/24/23 1843 11/24/23 1950  BP: (!) 155/55 131/63  Pulse: (!) 108 (!) 105  Resp: 20 15  Temp: 98.2 F (36.8 C)   SpO2: 100% 100%     General: Awake, interactive  CV:  Regular rate at the time of my evaluation, good peripheral perfusion.  Resp:  Unlabored respirations Abd:  Nondistended, soft, no significant tenderness  to palpation Neuro:  Symmetric facial movement, fluid speech   ED Results / Procedures / Treatments   Labs (all labs ordered are listed, but only abnormal results are displayed) Labs Reviewed  COMPREHENSIVE METABOLIC PANEL WITH GFR - Abnormal; Notable for the following components:      Result Value   Glucose, Bld 111 (*)    All other components within normal limits  CBC  POC OCCULT BLOOD, ED  TYPE AND SCREEN     EKG EKG independently reviewed and interpreted by myself demonstrates:    RADIOLOGY Imaging independently reviewed and interpreted by myself demonstrates:   Formal Radiology Read:  No results found.  PROCEDURES:  Critical Care performed: No  Procedures   MEDICATIONS ORDERED IN ED: Medications - No data to display   IMPRESSION / MDM / ASSESSMENT AND PLAN / ED COURSE  I reviewed the triage vital signs and the nursing notes.  Differential diagnosis includes, but is not limited to, bleeding related to recent EGD/colonoscopy, lower suspicion of other GI bleed  Patient's presentation is most consistent with acute presentation with potential threat to life or  bodily function.  73 year old female presenting with GI bleeding in the setting of recent EGD/colonoscopy.  Mild tachycardia on presentation, improved at the time of my initial evaluation.  Case discussed with Dr. Onita with GI who performed her EGD/colonoscopy yesterday.  He strongly suspects that patient's clinical history of passage of clot followed by small amount of ongoing bleeding is likely related to her resected rectal polyp.  He is reassured by her workup here including CBC with normal white blood cell count, stable hemoglobin.  CMP without significant derangement.  He does note that some mild abdominal cramping is not unexpected following her procedure.  He does feel that patient is stable for discharge with strict return precautions.  I did discuss the plan with the patient.  She is comfortable with  this.  She was discharged in stable condition.      FINAL CLINICAL IMPRESSION(S) / ED DIAGNOSES   Final diagnoses:  Rectal bleeding     Rx / DC Orders   ED Discharge Orders     None        Note:  This document was prepared using Dragon voice recognition software and may include unintentional dictation errors.   Levander Slate, MD 11/24/23 2300

## 2023-11-24 NOTE — ED Triage Notes (Signed)
 Colonoscopy yesterday. Today, noticed bleeding in toilet. Also c/o abd cramping.

## 2023-11-24 NOTE — Discharge Instructions (Addendum)
 You were seen in the ER today for evaluation of rectal bleeding after your recent procedure.  Your testing today was overall reassuring.  We discussed the case with Dr. Onita who noted that the small amount of bleeding you had is not unexpected after your recent procedure.  However, if you have worsening bleeding, or if your abdominal pain gets significantly worse, or if you develop any other new or concerning symptoms, please return to the ER for further evaluation.

## 2023-11-30 ENCOUNTER — Ambulatory Visit: Admitting: Nurse Practitioner

## 2023-11-30 ENCOUNTER — Encounter: Payer: Self-pay | Admitting: Nurse Practitioner

## 2023-11-30 VITALS — BP 138/82 | HR 100 | Temp 97.8°F | Resp 20 | Ht 63.0 in | Wt 143.6 lb

## 2023-11-30 DIAGNOSIS — G4733 Obstructive sleep apnea (adult) (pediatric): Secondary | ICD-10-CM

## 2023-11-30 DIAGNOSIS — J4520 Mild intermittent asthma, uncomplicated: Secondary | ICD-10-CM

## 2023-11-30 DIAGNOSIS — R079 Chest pain, unspecified: Secondary | ICD-10-CM

## 2023-11-30 DIAGNOSIS — F411 Generalized anxiety disorder: Secondary | ICD-10-CM

## 2023-11-30 DIAGNOSIS — M542 Cervicalgia: Secondary | ICD-10-CM

## 2023-11-30 MED ORDER — ALBUTEROL SULFATE HFA 108 (90 BASE) MCG/ACT IN AERS: 2.0000 | INHALATION_SPRAY | Freq: Four times a day (QID) | RESPIRATORY_TRACT | 2 refills | Status: AC | PRN

## 2023-11-30 NOTE — Patient Instructions (Signed)
 To make appt with SW to discuss anxiety   Make sure to take time to rest.  Albuterol  sent to pharmacy to take for wheezing or worsening shortness of breath.

## 2023-11-30 NOTE — Progress Notes (Unsigned)
 Careteam: Patient Care Team: Abdul Fine, MD as PCP - General (Family Medicine) Laurice Francis NOVAK, OD (Optometry) PLACE OF SERVICE:  [facility]   Advanced Directive information Does Patient Have a Medical Advance Directive?: No;Yes, Would patient like information on creating a medical advance directive?: No - Patient declined, Type of Advance Directive: Living will;Healthcare Power of Attorney  Allergies  Allergen Reactions   Levofloxacin     IV Levaquin causes burning   Other Itching    Surgical glue    Chief Complaint  Patient presents with   Back Pain    Very weak & having neck pain     HPI: Patient is a 73 y.o. female seen in today at [facility] due to pain.  Discussed the use of AI scribe software for clinical note transcription with the patient, who gave verbal consent to proceed.  History of Present Illness Andrea Noble is a 73 year old female with asthma and supraventricular tachycardia who presents with extreme tiredness and feeling overwhelmed.  She attributes her extreme tiredness and feeling overwhelmed to recent medical procedures and personal stressors. She underwent a colonoscopy on July 24, followed by an emergency room visit due to complications. She also received spinal injections for sciatica earlier this week, which were effective, and had gel shots in her knee with a follow-up scheduled for tomorrow.  She describes ongoing neck and lower back pain, which she believes originated from a whiplash incident. A recent fall while trying to retrieve something from under the bed exacerbated her neck pain. She is scheduled to see an orthopedic specialist tomorrow.  She has a history of asthma and reports recent exposure to mold and mildew, which aggravated her condition. She experiences wheezing and shortness of breath, particularly when exposed to certain smells. She uses a CPAP machine, which she finds helpful, and takes Lyleq inhalers twice daily.  Her albuterol  inhaler is expired, and she needs a new prescription.  She reports experiencing chest pain over the past week. She has a history of supraventricular tachycardia but does not believe it has recurred. She mentions having a new blood pressure cuff but finds it difficult to use due to its complexity.  She takes Cymbalta  and Buspar  for anxiety and mood, and she recently resumed melatonin for sleep after discontinuing it during her colonoscopy preparation. She reports having a good night's sleep last night. She also takes diltiazem  180 mg at noon for her heart condition.  She mentions feeling stressed due to recent changes in her home environment, including the introduction of new furniture and the associated disruption.     Review of Systems:  Review of Systems  Constitutional:  Positive for malaise/fatigue. Negative for chills, fever and weight loss.  HENT:  Negative for tinnitus.   Respiratory:  Positive for cough and shortness of breath. Negative for sputum production.   Cardiovascular:  Positive for chest pain and palpitations. Negative for leg swelling.  Gastrointestinal:  Negative for abdominal pain, constipation, diarrhea and heartburn.  Genitourinary:  Negative for dysuria, frequency and urgency.  Musculoskeletal:  Positive for back pain, falls, myalgias and neck pain. Negative for joint pain.  Skin: Negative.   Neurological:  Negative for dizziness and headaches.  Psychiatric/Behavioral:  Negative for depression and memory loss. The patient is nervous/anxious. The patient does not have insomnia.     Past Medical History:  Diagnosis Date   Anxiety    Arthritis    Asthma    Cataract  Chronic constipation    Chronic insomnia    Depression    Diverticulitis    GAD (generalized anxiety disorder)    GERD (gastroesophageal reflux disease)    Hodgkin's lymphoma (HCC) 2006   underwent radiation and chemo, last CT scan since moving to [place] from [place] 2018   Hyperlipidemia     Hypertension    Hypothyroidism    Menopausal syndrome    OAB (overactive bladder)    Paresthesia    Pneumonia    PSVT (paroxysmal supraventricular tachycardia) (HCC)    Seasonal allergies    Sleep apnea    SVT (supraventricular tachycardia) (HCC)    Thyroid  disease    hypothyroid   Past Surgical History:  Procedure Laterality Date   BACK SURGERY     basal cell carcinoma surgery     BLEPHAROPLASTY Bilateral    BREAST BIOPSY     benign   BREAST BIOPSY  07/27/2018   us  core bx venus COLUMNAR CELL METAPLASIA, AND PSEUDOANGIOMATOUS STROMAL   CARPAL TUNNEL RELEASE Right    CARPAL TUNNEL RELEASE Left    CATARACT EXTRACTION Bilateral 2015   CLEFT LIP REPAIR     COLON SURGERY     COLONOSCOPY N/A 11/23/2023   Procedure: COLONOSCOPY;  Surgeon: Onita Elspeth Sharper, DO;  Location: [facility] ENDOSCOPY;  Service: Gastroenterology;  Laterality: N/A;  DM   COLONOSCOPY WITH PROPOFOL  N/A 07/09/2018   Procedure: COLONOSCOPY WITH PROPOFOL ;  Surgeon: Gaylyn Gladis PENNER, MD;  Location: [facility] ENDOSCOPY;  Service: Endoscopy;  Laterality: N/A;   DE QUERVAIN'S RELEASE Bilateral    DIAGNOSTIC LAPAROSCOPY     endometriosis   ESOPHAGOGASTRODUODENOSCOPY N/A 07/09/2018   Procedure: ESOPHAGOGASTRODUODENOSCOPY (EGD);  Surgeon: Gaylyn Gladis PENNER, MD;  Location[facility] END[facility]COPY;  Service: Endoscopy;  Laterality: N/A;   ESOPHAGOGASTRODUODENOSCOPY N/A 11/23/2023   Procedure: EGD (ESOPHAGOGASTRODUODENOSCOPY);  Surgeon: Onita Elspeth Sharper, DO;  Location: [facility] ENDOSCOPY;  Service: Gastroenterology;  Laterality: N/A;   EYE SURGERY     FINGER SURGERY Right    Middle finger joint replaced   JOINT REPLACEMENT     MAXIMUM ACCESS (MAS) TRANSFORAMINAL LUMBAR INTERBODY FUSION (TLIF) 2 LEVEL Right 07/06/2020   Procedure: L4/5, L5/S1 TRANSFORAMINAL LUMBAR INTERBODY FUSION (TLIF), L4-S1 PEDICLE SCREWS,DECOMPRESSION ;  Surgeon: Bluford Elspeth, MD;  Location: [facility] ORS;  Service: Neurosurgery;  Laterality: Right;   POLYPECTOMY   11/23/2023   Procedure: POLYPECTOMY, INTESTINE;  Surgeon: Onita Elspeth Sharper, DO;  Location: [facility] ENDOSCOPY;  Service: Gastroenterology;;   THUMB SURGERY Right    took tendon from forearm and wrapped it around the thumb joint   Social History:   reports that she has never smoked. She has been exposed to tobacco smoke. She has never used smokeless tobacco. She reports current alcohol  use. She reports that she does not use drugs.  Family History  Problem Relation Age of Onset   Breast cancer Maternal Aunt    Anuerysm Mother    Stroke Mother    Heart attack Father    Alzheimer's disease Father    Developmental delay Brother     Medications: Patient's Medications  New Prescriptions   No medications on file  Previous Medications   ACETAMINOPHEN  (TYLENOL ) 650 MG CR TABLET    Take 1 tablet (650 mg total) by mouth at bedtime.   AZELASTINE  (ASTELIN ) 0.1 % NASAL SPRAY    USE 2 SPRAYS IN EACH NOSTRIL TWICE A DAY AS DIRECTED   BUSPIRONE  (BUSPAR ) 15 MG TABLET    Take 1 tablet (15 mg total) by mouth  2 (two) times daily.   CALCIUM  CARBONATE-VIT D-MIN (CALTRATE MINIS PLUS MINERALS PO)    Take 40 mcg by mouth daily.   CARBOXYMETHYLCELLULOSE SODIUM (THERATEARS) 0.25 % SOLN    Place 1 drop into both eyes daily as needed (Dry eyes).   DICLOFENAC  SODIUM (VOLTAREN ) 1 % GEL    Apply 1 application topically 2 (two) times daily.   DILTIAZEM  (CARDIZEM  CD) 120 MG 24 HR CAPSULE    Take 1 capsule (120 mg total) by mouth daily.   DULOXETINE  (CYMBALTA ) 20 MG CAPSULE    TAKE 1 CAPSULE(20 MG) BY MOUTH TWICE DAILY   FLUOCINONIDE  CREAM (LIDEX ) 0.05 %    Apply 1 application topically daily as needed (Eczema).   FLUTICASONE -SALMETEROL (WIXELA INHUB) 100-50 MCG/ACT AEPB    Inhale 1 puff into the lungs 2 (two) times daily.   GABAPENTIN  (NEURONTIN ) 100 MG CAPSULE    Take 1 capsule (100 mg total) by mouth daily as needed.   LEVOTHYROXINE  (SYNTHROID ) 50 MCG TABLET    Take 1-1.5 tablets (50-75 mcg total) by mouth See admin  instructions. Take 75 mg every  Monday, Wednesday and Friday Take 50 mg all the other days   LIOTHYRONINE  (CYTOMEL ) 5 MCG TABLET    Take 0.5 tablets (2.5 mcg total) by mouth daily.   MELATONIN 10 MG TABS    Take 10 mg by mouth at bedtime. Timed-Release   MONTELUKAST  (SINGULAIR ) 10 MG TABLET    Take 1 tablet (10 mg total) by mouth at bedtime.   MULTIPLE VITAMIN (MULTIVITAMIN) TABLET    Take 1 tablet by mouth daily. Vita Fusion adult Gummie   PANTOPRAZOLE  (PROTONIX ) 40 MG TABLET    Take 1 tablet (40 mg total) by mouth every morning.   POLYETHYL GLYCOL-PROPYL GLYCOL (SYSTANE) 0.4-0.3 % GEL OPHTHALMIC GEL    Place 1 application  into both eyes at bedtime.   POLYETHYLENE GLYCOL POWDER (GLYCOLAX /MIRALAX ) 17 GM/SCOOP POWDER    Take 17 g by mouth daily.   PRAVASTATIN  (PRAVACHOL ) 40 MG TABLET    Take 1 tablet (40 mg total) by mouth daily.   PREMPRO  0.45-1.5 MG TABLET    Take 1 tablet by mouth daily.   SENNA (SENOKOT) 8.6 MG TABS TABLET    Take 1 tablet (8.6 mg total) by mouth daily.   SODIUM CHLORIDE  (OCEAN) 0.65 % SOLN NASAL SPRAY    Place 1 spray into both nostrils 2 (two) times daily.   TRAMADOL  (ULTRAM ) 50 MG TABLET    Take 1 tablet (50 mg total) by mouth every 12 (twelve) hours as needed.   TRAZODONE  (DESYREL ) 50 MG TABLET    Take 1 tablet (50 mg total) by mouth at bedtime.   WHITE PETROLATUM-MINERAL OIL (LUBRICANT PM) OINT    Apply 1 g to eye at bedtime.  Modified Medications   No medications on file  Discontinued Medications   No medications on file    Physical Exam:  Vitals:   11/30/23 0959  BP: 138/82  Pulse: 100  Resp: 20  Temp: 97.8 F (36.6 C)  SpO2: 97%  Weight: 143 lb 9.6 oz (65.1 kg)  Height: 5' 3 (1.6 m)   Body mass index is 25.44 kg/m. Wt Readings from Last 3 Encounters:  11/30/23 143 lb 9.6 oz (65.1 kg)  11/24/23 141 lb 1.5 oz (64 kg)  11/23/23 142 lb 3.2 oz (64.5 kg)    Physical Exam Constitutional:      General: She is not in acute distress.    Appearance: She  is well-developed. She is not diaphoretic.  HENT:     Head: Normocephalic and atraumatic.     Mouth/Throat:     Pharynx: No oropharyngeal exudate.  Eyes:     Conjunctiva/sclera: Conjunctivae normal.     Pupils: Pupils are equal, round, and reactive to light.  Cardiovascular:     Rate and Rhythm: Normal rate and regular rhythm.     Heart sounds: Normal heart sounds.  Pulmonary:     Effort: Pulmonary effort is normal.     Breath sounds: Normal breath sounds.  Abdominal:     General: Bowel sounds are normal.     Palpations: Abdomen is soft.  Musculoskeletal:     Cervical back: Normal range of motion and neck supple.     Right lower leg: No edema.     Left lower leg: No edema.  Skin:    General: Skin is warm and dry.  Neurological:     Mental Status: She is alert.  Psychiatric:        Mood and Affect: Mood is anxious.     Labs reviewed: Basic Metabolic Panel: Recent Labs    01/04/23 1602 02/06/23 1500 11/24/23 1844  NA 134 131* 135  K 3.8 4.0 3.7  CL 98 97 103  CO2 22 26 23   GLUCOSE 100* 92 111*  BUN 10 13 13   CREATININE 0.85 0.91 0.59  CALCIUM  8.9 9.2 8.9  PHOS  --  3.1  --   TSH  --  1.59  --    Liver Function Tests: Recent Labs    02/06/23 1500 11/24/23 1844  AST 20 28  ALT 20 24  ALKPHOS 43 118  BILITOT 0.4 0.5  PROT 7.0 6.9  ALBUMIN 4.3  4.3 3.8   No results for input(s): LIPASE, AMYLASE in the last 8760 hours. No results for input(s): AMMONIA in the last 8760 hours. CBC: Recent Labs    02/06/23 1500 11/24/23 1844  WBC 7.9 9.7  HGB 12.3 12.4  HCT 37.9 37.4  MCV 93.1 92.6  PLT 224.0 381   Lipid Panel: Recent Labs    02/06/23 1500 10/02/23 0748  CHOL 184 194  HDL 95.10 88  LDLCALC 77 87  TRIG 58.0 92  CHOLHDL 2 2.2   TSH: Recent Labs    02/06/23 1500  TSH 1.59   A1C: Lab Results  Component Value Date   HGBA1C 5.7 (H) 10/02/2023     Assessment/Plan Assessment and Plan Assessment & Plan Fatigue  tiredness likely  due to recent medical procedures and stress. No significant blood work abnormalities based on recent results.  - Advise rest and avoid strenuous activities.  Asthma Reports exacerbated by mold exposure, with wheezing and shortness of breath. Current inhaler expired requesting refill. No wheezing in office today, education provided that inhaler will cause increase in tachycardia, she has hx of SVT - Prescribe new albuterol  inhaler.  Chest pain Intermittent chest pain possibly related to stress or supraventricular tachycardia history. She reports occasionally feeling elevated heart rate.  - Order EKG to evaluate chest pain no acute findings but wil fwd to cardiology to see if they want to bring her in for further evaluation and to compare to previous- unable to see due to cardiologist in the [facility] system Strict follow up to go to ED for worsening chest pains/shortness of breath.   Anxiety and depression Anxiety and stress possibly exacerbated by recent events. On Cymbalta  and Buspar . Reports feeling overwhelmed. - Recommend contacting social worker [employer]  for support.  Obstructive sleep apnea, on CPAP Managed with CPAP, reported as beneficial.  Neck pain Currently being evaluated by orthopedic, has follow up tomorrow.    Follow up scheduled with PCP on  12/22/2023 Saabir Blyth K. Caro BODILY  [facility] [phone]

## 2023-12-17 ENCOUNTER — Encounter: Payer: Self-pay | Admitting: Emergency Medicine

## 2023-12-17 ENCOUNTER — Other Ambulatory Visit: Payer: Self-pay

## 2023-12-17 ENCOUNTER — Emergency Department

## 2023-12-17 ENCOUNTER — Emergency Department: Admission: EM | Admit: 2023-12-17 | Discharge: 2023-12-17 | Disposition: A | Discharge status: Home / Self Care

## 2023-12-17 DIAGNOSIS — R0981 Nasal congestion: Secondary | ICD-10-CM | POA: Insufficient documentation

## 2023-12-17 DIAGNOSIS — R002 Palpitations: Secondary | ICD-10-CM | POA: Insufficient documentation

## 2023-12-17 LAB — BASIC METABOLIC PANEL WITH GFR
Anion gap: 11 (ref 5–15)
BUN: 13 mg/dL (ref 8–23)
CO2: 25 mmol/L (ref 22–32)
Calcium: 9.2 mg/dL (ref 8.9–10.3)
Chloride: 103 mmol/L (ref 98–111)
Creatinine, Ser: 1.04 mg/dL — ABNORMAL HIGH (ref 0.44–1.00)
GFR, Estimated: 58 mL/min — ABNORMAL LOW (ref >60)
Glucose, Bld: 119 mg/dL — ABNORMAL HIGH (ref 70–99)
Potassium: 3.4 mmol/L — ABNORMAL LOW (ref 3.5–5.1)
Sodium: 139 mmol/L (ref 135–145)

## 2023-12-17 LAB — CBC
HCT: 36.4 % (ref 36.0–46.0)
Hemoglobin: 11.8 g/dL — ABNORMAL LOW (ref 12.0–15.0)
MCH: 30.3 pg (ref 26.0–34.0)
MCHC: 32.4 g/dL (ref 30.0–36.0)
MCV: 93.6 fL (ref 80.0–100.0)
Platelets: 320 K/uL (ref 150–400)
RBC: 3.89 MIL/uL (ref 3.87–5.11)
RDW: 15.2 % (ref 11.5–15.5)
WBC: 5.8 K/uL (ref 4.0–10.5)
nRBC: 0 % (ref 0.0–0.2)

## 2023-12-17 LAB — TROPONIN I (HIGH SENSITIVITY)
Troponin I (High Sensitivity): 5 ng/L (ref <18)
Troponin I (High Sensitivity): 5 ng/L (ref <18)

## 2023-12-17 LAB — MAGNESIUM: Magnesium: 2.5 mg/dL — ABNORMAL HIGH (ref 1.7–2.4)

## 2023-12-17 MED ORDER — SODIUM CHLORIDE 0.9 % IV BOLUS
1000.0000 mL | Freq: Once | INTRAVENOUS | Status: AC
  Administered 2023-12-17: 1000 mL via INTRAVENOUS

## 2023-12-17 NOTE — Discharge Instructions (Signed)
 You were seen in the emergency department for 4 weeks of palpitations and sinus congestion.  Workup today was reassuring but that does not mean that nothing is wrong but rather you are safe to continue the workup as an outpatient.  Please call your cardiologist tomorrow.  Asked them whether a Zio patch is indicated to ensure that you are not going in and out of episodes of SVT.  Return with any acutely worsening symptoms or any other emergency.  It was very nice meeting you and I wish you the best of luck with everything -- RETURN PRECAUTIONS & AFTERCARE: (ENGLISH) RETURN PRECAUTIONS: Return immediately to the emergency department or see/call your doctor if you feel worse, weak or have changes in speech or vision, are short of breath, have fever, vomiting, pain, bleeding or dark stool, trouble urinating or any new issues. Return here or see/call your doctor if not improving as expected for your suspected condition. FOLLOW-UP CARE: Call your doctor and/or any doctors we referred you to for more advice and to make an appointment. Do this today, tomorrow or after the weekend. Some doctors only take PPO insurance so if you have HMO insurance you may want to contact your HMO or your regular doctor for referral to a specialist within your plan. Either way tell the doctor's office that it was a referral from the emergency department so you get the soonest possible appointment.  YOUR TEST RESULTS: Take result reports of any blood or urine tests, imaging tests and EKG's to your doctor and any referral doctor. Have any abnormal tests repeated. Your doctor or a referral doctor can let you know when this should be done. Also make sure your doctor contacts this hospital to get any test results that are not currently available such as cultures or special tests for infection and final imaging reports, which are often not available at the time you leave the ER but which may list additional important findings that are not  documented on the preliminary report. BLOOD PRESSURE: If your blood pressure was greater than 120/80 have your blood pressure rechecked within 1 to 2 weeks. MEDICATION SIDE EFFECTS: Do not drive, walk, bike, take the bus, etc. if you have received or are being prescribed any sedating medications such as those for pain or anxiety or certain antihistamines like Benadryl . If you have been give one of these here get a taxi home or have a friend drive you home. Ask your pharmacist to counsel you on potential side effects of any new medication

## 2023-12-17 NOTE — ED Provider Triage Note (Signed)
 Emergency Medicine Provider Triage Evaluation Note  Andrea Noble , Noble 73 y.o. female  was evaluated in triage.  Pt complains of shortness of breath x 4 weeks. No chest pain. Sent over from [facility] for further workup. No recent illnesses. No fevers.   Review of Systems  Positive:  Negative:   Physical Exam  There were no vitals taken for this visit. Gen:   Awake, no distress   Resp:  Normal effort; LCTAB MSK:   Moves extremities without difficulty  Other:  CV - Tachycardic   Medical Decision Making  Medically screening exam initiated at 3:37 PM.  Appropriate orders placed.  Andrea Noble was informed that the remainder of the evaluation will be completed by another provider, this initial triage assessment does not replace that evaluation, and the importance of remaining in the ED until their evaluation is complete.    Andrea Noble, Andrea Scully A, PA-C 12/17/23 1539

## 2023-12-17 NOTE — ED Triage Notes (Signed)
 Pt sent from [facility] with concerns of tachycardia and SHOB. Pt reports this started approx 4 weeks ago. Denies any CP.

## 2023-12-17 NOTE — ED Provider Notes (Signed)
 [facility] Provider Note    Event Date/Time   First MD Initiated Contact with Patient 12/17/23 1715     (approximate)   History   Shortness of Breath   HPI  Andrea Noble is a 73 y.o. female  asthma and supraventricular tachycardia who presents from her primary care physician's office for 4 weeks of palpitations intermittent chest discomfort.  Patient presents with her husband and states that she initially went to her primary care physician because she was concerned about congestion.  She has been using nasal saline twice a day.  She does have a Nettie pot but has not been using this.  She states that for the past 4 weeks she has intermittently had palpitations that have resulted in chest pressure.  She denies any currently.  She denies any shortness of breath fevers or chills cough.  She sees [facility] cardiology      Physical Exam   Triage Vital Signs: ED Triage Vitals  Encounter Vitals Group     BP 12/17/23 1536 (!) 140/71     Girls Systolic BP Percentile --      Girls Diastolic BP Percentile --      Boys Systolic BP Percentile --      Boys Diastolic BP Percentile --      Pulse Rate 12/17/23 1536 (!) 115     Resp 12/17/23 1536 20     Temp 12/17/23 1536 98.5 F (36.9 C)     Temp Source 12/17/23 1536 Oral     SpO2 12/17/23 1536 100 %     Weight --      Height --      Head Circumference --      Peak Flow --      Pain Score 12/17/23 1534 0     Pain Loc --      Pain Education --      Exclude from Growth Chart --     Most recent vital signs: Vitals:   12/17/23 1536 12/17/23 1830  BP: (!) 140/71 (!) 144/64  Pulse: (!) 115 93  Resp: 20 (!) 21  Temp: 98.5 F (36.9 C)   SpO2: 100% 100%    Nursing Triage Note reviewed. Vital signs reviewed and patients oxygen saturation is normoxic  General: Patient is well nourished, well developed, awake and alert, resting comfortably in no acute distress Head: Normocephalic and atraumatic Eyes: Normal  inspection, extraocular muscles intact, no conjunctival pallor Ear, nose, throat: Normal external exam Neck: Normal range of motion Respiratory: Patient is in no respiratory distress, lungs CTAB Cardiovascular: Patient is not tachycardic, RRR without murmur appreciated GI: Abd SNT with no guarding or rebound  Back: Normal inspection of the back with good strength and range of motion throughout all ext Extremities: pulses intact with good cap refills, no LE pitting edema or calf tenderness Neuro: The patient is alert and oriented to person, place, and time, appropriately conversive, with 5/5 bilat UE/LE strength, no gross motor or sensory defects noted. Coordination appears to be adequate. Skin: Warm, dry, and intact Psych: normal mood and affect, no SI or HI  ED Results / Procedures / Treatments   Labs (all labs ordered are listed, but only abnormal results are displayed) Labs Reviewed  BASIC METABOLIC PANEL WITH GFR - Abnormal; Notable for the following components:      Result Value   Potassium 3.4 (*)    Glucose, Bld 119 (*)    Creatinine, Ser 1.04 (*)  GFR, Estimated 58 (*)    All other components within normal limits  CBC - Abnormal; Notable for the following components:   Hemoglobin 11.8 (*)    All other components within normal limits  MAGNESIUM - Abnormal; Notable for the following components:   Magnesium 2.5 (*)    All other components within normal limits  TROPONIN I (HIGH SENSITIVITY)  TROPONIN I (HIGH SENSITIVITY)     EKG EKG and rhythm strip are interpreted by myself:   EKG: tachycardic sinus rhythm] at heart rate of 109, normal QRS duration, QTc 479, normal or nonspecific ST segments and T waves no ectopy EKG not consistent with Acute STEMI Rhythm strip: Sinus tachycardia in lead II   RADIOLOGY Xray of chest: No acute abnormality on my independent review and radiologist agrees    PROCEDURES:  Critical Care performed: No  Procedures   MEDICATIONS  ORDERED IN ED: Medications  sodium chloride  0.9 % bolus 1,000 mL (0 mLs Intravenous Stopped 12/17/23 1921)     IMPRESSION / MDM / ASSESSMENT AND PLAN / ED COURSE                                Differential diagnosis includes, but is not limited to, arrhythmia, ACS, pneumonia, electrolyte derangement   ED course: Patient arrives and she is well-appearing and initial EKG demonstrates no arrhythmia and no ischemia.  Troponin x 2 were not elevated.  She had no profound electrolyte derangements and chest x-ray was unremarkable.  I counseled the patient that I cannot explain her symptoms but there was a chance that she may be going in and out of SVT and we may be missing it.  I advised her to call her cardiology team and discussed the indications for an outpatient Zio patch and she would do so tomorrow.  All questions answered and patient requested discharge  At time of discharge there is no evidence of acute life, limb, vision, or fertility threat. Patient has stable vital signs, pain is well controlled, patient is ambulatory and p.o. tolerant.  Discharge instructions were completed using the [employer] system. I would refer you to those at this time. All warnings prescriptions follow-up etc. were discussed in detail with the patient. Patient indicates understanding and is agreeable with this plan. All questions answered.  Patient is made aware that they may return to the emergency department for any worsening or new condition or for any other emergency.    Clinical Course as of 12/18/23 0040  Sun Dec 17, 2023  1715 Troponin I (High Sensitivity): 5 Not elevated [HD]  1805 DG Chest 2 View No acute findings [HD]  1846 Magnesium(!): 2.5 Not low [HD]  1846 SpO2: 100 % Continues to sat 100% on room air in the room [HD]  1846 Troponin I (High Sensitivity): 5 Repeat troponin not elevated [HD]    Clinical Course User Index [HD] Nicholaus Rolland BRAVO, MD   Risk: 5 This patient has a high risk of  morbidity due to further diagnostic testing or treatment. Rationale: This patient's evaluation and management involve a high risk of morbidity due to the potential severity of presenting symptoms, need for diagnostic testing, and/or initiation of treatment that may require close monitoring. The differential includes conditions with potential for significant deterioration or requiring escalation of care. Treatment decisions in the ED, including medication administration, procedural interventions, or disposition planning, reflect this level of risk. Additional Support: -- Drug therapy requiring  intensive monitoring for toxicity [ ]  -- Decision regarding elective major surgery with idenitified patient or procedure risk factors [ ]  -- Decision regarding hospitalization or escalation of hospital-level care [ ]  -- Decision not to resuscitate or to de-escalate care because of poor prognosis [ ]  -- Parental controlled substances [ ]   COPA: 5 The patient has a severe exacerbation, progression, or side effect of treatment of the following illness/illnesses: []  OR  The patient has the following acute or chronic illness/injury that poses a possible threat to life or bodily function: [X] : The patient has a potentially serious acute condition or an acute exacerbation of a chronic illness requiring urgent evaluation and management in the Emergency Department. The clinical presentation necessitates immediate consideration of life-threatening or function-threatening diagnoses, even if they are ultimately ruled out.  Data(2/3 categories following were performed): 5 I reviewed or ordered at least three unique tests, external notes, and/or the history required an independent historian as one of the three requirements as following: CBC, BMP, troponin AND  I independently interpreted the following test: X-ray of chest OR  I discussed the management of the patient with the following external physician or qualified  healthcare provider: []     Suggested E/M Coding Level: 5, 99285, This has been selected based on the 01/19/2022 CPT guidelines for E/M codes in the Emergency Department based on 2/3 of the CoPA, Data, and Risk.   FINAL CLINICAL IMPRESSION(S) / ED DIAGNOSES   Final diagnoses:  Palpitations  Sinus congestion     Rx / DC Orders   ED Discharge Orders     None        Note:  This document was prepared using Dragon voice recognition software and may include unintentional dictation errors.   Nicholaus Rolland BRAVO, MD 12/18/23 [phone]

## 2023-12-17 NOTE — ED Notes (Signed)
 Pt stuck twice by this RN. Unable to obtain IV. RN Ozell called at this time to attempt.

## 2023-12-22 ENCOUNTER — Encounter: Payer: Self-pay | Admitting: Student

## 2023-12-22 ENCOUNTER — Ambulatory Visit: Admitting: Student

## 2023-12-22 VITALS — BP 126/82 | HR 92 | Temp 96.6°F | Ht 63.0 in | Wt 144.0 lb

## 2023-12-22 DIAGNOSIS — M545 Low back pain, unspecified: Secondary | ICD-10-CM

## 2023-12-22 DIAGNOSIS — M542 Cervicalgia: Secondary | ICD-10-CM

## 2023-12-22 DIAGNOSIS — F331 Major depressive disorder, recurrent, moderate: Secondary | ICD-10-CM

## 2023-12-22 DIAGNOSIS — M25551 Pain in right hip: Secondary | ICD-10-CM

## 2023-12-22 DIAGNOSIS — M25561 Pain in right knee: Secondary | ICD-10-CM

## 2023-12-22 DIAGNOSIS — G8929 Other chronic pain: Secondary | ICD-10-CM

## 2023-12-22 DIAGNOSIS — I10 Essential (primary) hypertension: Secondary | ICD-10-CM

## 2023-12-22 DIAGNOSIS — J4520 Mild intermittent asthma, uncomplicated: Secondary | ICD-10-CM

## 2023-12-22 MED ORDER — DILTIAZEM HCL ER COATED BEADS 120 MG PO CP24: 240.0000 mg | ORAL_CAPSULE | Freq: Every day | ORAL | Status: DC

## 2023-12-22 NOTE — Patient Instructions (Addendum)
 Consider reviewing this program with Dr. Beauford Fairly: [email or web address] VISIT SUMMARY:  During your visit, we discussed your heart palpitations, memory concerns, chronic pain, fibromyalgia, depression, anxiety, and shortness of breath. We reviewed your current medications and made some recommendations to help manage your symptoms and improve your overall health.  YOUR PLAN:  -MILD NEUROCOGNITIVE DISORDER: Mild neurocognitive disorder is a condition characterized by slight but noticeable and measurable decline in cognitive abilities, including memory and thinking skills. We recommend considering reducing or discontinuing gabapentin  to improve cognitive function, following a Mediterranean diet, and possibly seeing a neurologist for further evaluation and participation in a memory program.  -HEART PALPITATIONS: Heart palpitations are feelings of having a fast-beating, fluttering, or pounding heart. Your cardiologist has increased your diltiazem  to 240 mg and started you on a baby aspirin to help manage these symptoms.  -CHRONIC BACK PAIN AND RIGHT KNEE PAIN: Chronic back pain and knee pain are long-term pain conditions that can affect your daily activities. We will provide a referral for physical therapy to help improve your knee, back, and balance.  -FIBROMYALGIA: Fibromyalgia is a condition characterized by widespread musculoskeletal pain accompanied by fatigue, sleep, memory, and mood issues. We suggest considering reducing or discontinuing gabapentin  to improve cognitive function and continuing to use time-release Tylenol  at night as needed for pain management.  -DEPRESSION AND ANXIETY DISORDER: Depression and anxiety are mood disorders that can affect your overall well-being. You are currently on Buspar , trazodone , and duloxetine  for these conditions. We will leave these medications unchanged for now as there have been significant changes in the past six weeks.  -SHORTNESS OF BREATH:  Shortness of breath is a feeling of not being able to breathe well enough. We will continue to monitor this symptom and address it as needed.  -GENERAL HEALTH MAINTENANCE: We discussed the importance of diet and exercise for your overall health. We recommend following a Mediterranean diet, reducing sweets, and considering joining a memory program that includes diet and exercise components.  INSTRUCTIONS:  Please consider reducing or discontinuing gabapentin  to improve cognitive function. Follow the Mediterranean diet and reduce sweets in your diet. We will provide a referral for physical therapy for your knee, back, and balance. Consider seeing a neurologist for further evaluation and participation in a memory program. Continue your current medications for depression and anxiety without changes for now. Monitor your shortness of breath and let us  know if it worsens.  Mediterranean Diet A Mediterranean diet is based on the traditions of countries on the [employer]. It focuses on eating more: Fruits and vegetables. Whole grains, beans, nuts, and seeds. Heart-healthy fats. These are fats that are good for your heart. It involves eating less: Dairy. Meat and eggs. Processed foods with added sugar, salt, and fat. This type of diet can help prevent certain conditions. It can also improve outcomes if you have a long-term (chronic) disease, such as kidney or heart disease. What are tips for following this plan? Reading food labels Check packaged foods for: The serving size. For foods such as rice and pasta, the serving size is the amount of cooked product, not dry. The total fat. Avoid foods with saturated fat or trans fat. Added sugars, such as corn syrup. Shopping  Try to have a balanced diet. Buy a variety of foods, such as: Fresh fruits and vegetables. You may be able to get these from local farmers markets. You can also buy them frozen. Grains, beans, nuts, and seeds. Some of these  can be bought  in bulk. Fresh seafood. Poultry and eggs. Low-fat dairy products. Buy whole ingredients instead of foods that have already been packaged. If you can't get fresh seafood, buy precooked frozen shrimp or canned fish, such as tuna, salmon, or sardines. Stock your pantry so you always have certain foods on hand, such as olive oil, canned tuna, canned tomatoes, rice, pasta, and beans. Cooking Cook foods with extra-virgin olive oil instead of using butter or other vegetable oils. Have meat as a side dish. Have vegetables or grains as your main dish. This means having meat in small portions or adding small amounts of meat to foods like pasta or stew. Use beans or vegetables instead of meat in common dishes like chili or lasagna. Try out different cooking methods. Try roasting, broiling, steaming, and sauting vegetables. Add frozen vegetables to soups, stews, pasta, or rice. Add nuts or seeds for added healthy fats and plant protein at each meal. You can add these to yogurt, salads, or vegetable dishes. Marinate fish or vegetables using olive oil, lemon juice, garlic, and fresh herbs. Meal planning Plan to eat a vegetarian meal one day each week. Try to work up to two vegetarian meals, if possible. Eat seafood two or more times a week. Have healthy snacks on hand. These may include: Vegetable sticks with hummus. Greek yogurt. Fruit and nut trail mix. Eat balanced meals. These should include: Fruit: 2-3 servings a day. Vegetables: 4-5 servings a day. Low-fat dairy: 2 servings a day. Fish, poultry, or lean meat: 1 serving a day. Beans and legumes: 2 or more servings a week. Nuts and seeds: 1-2 servings a day. Whole grains: 6-8 servings a day. Extra-virgin olive oil: 3-4 servings a day. Limit red meat and sweets to just a few servings a month. Lifestyle  Try to cook and eat meals with your family. Drink enough fluid to keep your pee (urine) pale yellow. Be active every day.  This includes: Aerobic exercise, which is exercise that causes your heart to beat faster. Examples include running and swimming. Leisure activities like gardening, walking, or housework. Get 7-8 hours of sleep each night. Drink red wine if your provider says you can. A glass of wine is 5 oz (150 mL). You may be allowed to have: Up to 1 glass a day if you're female and not pregnant. Up to 2 glasses a day if you're female. What foods should I eat? Fruits Apples. Apricots. Avocado. Berries. Bananas. Cherries. Dates. Figs. Grapes. Lemons. Melon. Oranges. Peaches. Plums. Pomegranate. Vegetables Artichokes. Beets. Broccoli. Cabbage. Carrots. Eggplant. Green beans. Chard. Kale. Spinach. Onions. Leeks. Peas. Squash. Tomatoes. Peppers. Radishes. Grains Whole-grain pasta. Brown rice. Bulgur wheat. Polenta. Couscous. Whole-wheat bread. [place] Madeira. Meats and other proteins Beans. Almonds. Sunflower seeds. Pine nuts. Peanuts. Cod. Salmon. Scallops. Shrimp. Tuna. Tilapia. Clams. Oysters. Eggs. Chicken or [place] without skin. Dairy Low-fat milk. Cheese. Greek yogurt. Fats and oils Extra-virgin olive oil. Avocado oil. Grapeseed oil. Beverages Water. Red wine. Herbal tea. Sweets and desserts Greek yogurt with honey. Baked apples. Poached pears. Trail mix. Seasonings and condiments Basil. Cilantro. Coriander. Cumin. Mint. Parsley. Sage. Rosemary. Tarragon. Garlic. Oregano. Thyme. Pepper. Balsamic vinegar. Tahini. Hummus. Tomato sauce. Olives. Mushrooms. The items listed above may not be all the foods and drinks you can have. Talk to a dietitian to learn more. What foods should I limit? This is a list of foods that should be eaten rarely. Fruits Fruit canned in syrup. Vegetables Deep-fried potatoes, like [place] fries. Grains Packaged pasta or rice dishes.  Cereal with added sugar. Snacks with added sugar. Meats and other proteins Beef. Pork. Lamb. Chicken or [place] with skin. Hot dogs.  [place]. Dairy Ice cream. Sour cream. Whole milk. Fats and oils Butter. Canola oil. Vegetable oil. Beef fat (tallow). Lard. Beverages Juice. Sugar-sweetened soft drinks. Beer. Liquor and spirits. Sweets and desserts Cookies. Cakes. Pies. Candy. Seasonings and condiments Mayonnaise. Pre-made sauces and marinades. The items listed above may not be all the foods and drinks you should limit. Talk to a dietitian to learn more. Where to find more information American Heart Association (AHA): heart.org This information is not intended to replace advice given to you by your health care provider. Make sure you discuss any questions you have with your health care provider. Document Revised: 07/31/2022 Document Reviewed: 07/31/2022 Elsevier Patient Education  2024 [employer].

## 2023-12-24 ENCOUNTER — Encounter: Payer: Self-pay | Admitting: Student

## 2023-12-24 NOTE — Progress Notes (Signed)
 [facility]: [facility] Provider: ABDUL  Code Status: Full Code Goals of Care:     12/17/2023    3:36 PM  Advanced Directives  Does Patient Have a Medical Advance Directive? Yes  Type of Advance Directive Living will;Healthcare Power of Attorney     Chief Complaint  Patient presents with   Medical Management of Chronic Issues    Medical Management of Chronic Issues. 1 Month follow up. To discuss need for Pneumonia, Covid, Flu, Dexa, Eye and Foot exam    HPI: Patient is a 73 y.o. female seen today for medical management of chronic diseases.   Discussed the use of AI scribe software for clinical note transcription with the patient, who gave verbal consent to proceed.  History of Present Illness   Andrea Noble is a 73 year old female with heart palpitations and memory concerns who presents for evaluation and medication management.  She experiences heart palpitations, particularly during household activities, with episodes of racing heart that require rest for about half an hour to subside. Her cardiologist recently increased her diltiazem  to 240 mg and started her on a baby aspirin. Discontinuation of some medications has resulted in increased energy.  She has a history of knee and back issues, having received a gel shot in her knee and a spinal injection, which have provided relief. However, a fall on her knee shortly after the injection has caused ongoing issues. She wants to return to physical therapy for her knee and back, noting improved balance but still requiring a walker when feeling unsteady.  She is concerned about her memory, noting a past diagnosis of ADD for both herself and her husband. She attributes her memory issues to insufficient sleep.  Her current medications include gabapentin , taken at night and as needed for fibromyalgia, and time-release Tylenol  at night. She is concerned about the side effects of pantoprazole  and duloxetine , particularly  regarding magnesium levels. She is also on Buspar , trazodone , and Cymbalta  for mood management.  She describes a recent incident where her husband's driving scared her, leading to a discussion about his health and driving habits. She is frustrated with his stubbornness regarding his health care and mentions that he has been driving without his prescription glasses, using reading glasses instead.         Past Medical History:  Diagnosis Date   Anxiety    Arthritis    Asthma    Cataract    Chronic constipation    Chronic insomnia    Depression    Diverticulitis    GAD (generalized anxiety disorder)    GERD (gastroesophageal reflux disease)    Hodgkin's lymphoma (HCC) 2006   underwent radiation and chemo, last CT scan since moving to [place] from [place] 2018   Hyperlipidemia    Hypertension    Hypothyroidism    Menopausal syndrome    OAB (overactive bladder)    Paresthesia    Pneumonia    PSVT (paroxysmal supraventricular tachycardia) (HCC)    Seasonal allergies    Sleep apnea    SVT (supraventricular tachycardia) (HCC)    Thyroid  disease    hypothyroid    Past Surgical History:  Procedure Laterality Date   BACK SURGERY     basal cell carcinoma surgery     BLEPHAROPLASTY Bilateral    BREAST BIOPSY     benign   BREAST BIOPSY  07/27/2018   us  core bx venus COLUMNAR CELL METAPLASIA, AND PSEUDOANGIOMATOUS STROMAL   CARPAL  TUNNEL RELEASE Right    CARPAL TUNNEL RELEASE Left    CATARACT EXTRACTION Bilateral 2015   CLEFT LIP REPAIR     COLON SURGERY     COLONOSCOPY N/A 11/23/2023   Procedure: COLONOSCOPY;  Surgeon: Onita Elspeth Sharper, DO;  Location: [facility] ENDOSCOPY;  Service: Gastroenterology;  Laterality: N/A;  DM   COLONOSCOPY WITH PROPOFOL  N/A 07/09/2018   Procedure: COLONOSCOPY WITH PROPOFOL ;  Surgeon: Gaylyn Gladis PENNER, MD;  Location: [facility] ENDOSCOPY;  Service: Endoscopy;  Laterality: N/A;   DE QUERVAIN'S RELEASE Bilateral    DIAGNOSTIC LAPAROSCOPY     endometriosis    ESOPHAGOGASTRODUODENOSCOPY N/A 07/09/2018   Procedure: ESOPHAGOGASTRODUODENOSCOPY (EGD);  Surgeon: Gaylyn Gladis PENNER, MD;  Location: [facility] ENDOSCOPY;  Service: Endoscopy;  Laterality: N/A;   ESOPHAGOGASTRODUODENOSCOPY N/A 11/23/2023   Procedure: EGD (ESOPHAGOGASTRODUODENOSCOPY);  Surgeon: Onita Elspeth Sharper, DO;  Location: [facility] ENDOSCOPY;  Service: Gastroenterology;  Laterality: N/A;   EYE SURGERY     FINGER SURGERY Right    Middle finger joint replaced   JOINT REPLACEMENT     MAXIMUM ACCESS (MAS) TRANSFORAMINAL LUMBAR INTERBODY FUSION (TLIF) 2 LEVEL Right 07/06/2020   Procedure: L4/5, L5/S1 TRANSFORAMINAL LUMBAR INTERBODY FUSION (TLIF), L4-S1 PEDICLE SCREWS,DECOMPRESSION ;  Surgeon: Bluford Elspeth, MD;  Location: [facility] ORS;  Service: Neurosurgery;  Laterality: Right;   POLYPECTOMY  11/23/2023   Procedure: POLYPECTOMY, INTESTINE;  Surgeon: Onita Elspeth Sharper, DO;  Location: [facility] ENDOSCOPY;  Service: Gastroenterology;;   THUMB SURGERY Right    took tendon from forearm and wrapped it around the thumb joint    Allergies  Allergen Reactions   Levofloxacin     IV Levaquin causes burning   Other Itching    Surgical glue    Outpatient Encounter Medications as of 12/22/2023  Medication Sig   acetaminophen  (TYLENOL ) 650 MG CR tablet Take 1 tablet (650 mg total) by mouth at bedtime.   albuterol  (VENTOLIN  HFA) 108 (90 Base) MCG/ACT inhaler Inhale 2 puffs into the lungs every 6 (six) hours as needed for wheezing or shortness of breath.   azelastine  (ASTELIN ) 0.1 % nasal spray USE 2 SPRAYS IN EACH NOSTRIL TWICE A DAY AS DIRECTED   busPIRone  (BUSPAR ) 15 MG tablet Take 1 tablet (15 mg total) by mouth 2 (two) times daily.   Carboxymethylcellulose Sodium (THERATEARS) 0.25 % SOLN Place 1 drop into both eyes daily as needed (Dry eyes).   diclofenac  Sodium (VOLTAREN ) 1 % GEL Apply 1 application topically 2 (two) times daily.   DULoxetine  (CYMBALTA ) 20 MG capsule TAKE 1 CAPSULE(20 MG) BY MOUTH TWICE DAILY    fluticasone -salmeterol (WIXELA INHUB) 100-50 MCG/ACT AEPB Inhale 1 puff into the lungs 2 (two) times daily.   gabapentin  (NEURONTIN ) 100 MG capsule Take 1 capsule (100 mg total) by mouth daily as needed.   levothyroxine  (SYNTHROID ) 50 MCG tablet Take 1-1.5 tablets (50-75 mcg total) by mouth See admin instructions. Take 75 mg every  Monday, Wednesday and Friday Take 50 mg all the other days   liothyronine  (CYTOMEL ) 5 MCG tablet Take 0.5 tablets (2.5 mcg total) by mouth daily.   Melatonin 10 MG TABS Take 10 mg by mouth at bedtime. Timed-Release   montelukast  (SINGULAIR ) 10 MG tablet Take 1 tablet (10 mg total) by mouth at bedtime.   Multiple Vitamin (MULTIVITAMIN) tablet Take 1 tablet by mouth daily. Vita Fusion adult Gummie   pantoprazole  (PROTONIX ) 40 MG tablet Take 1 tablet (40 mg total) by mouth every morning.   Polyethyl Glycol-Propyl Glycol (SYSTANE) 0.4-0.3 % GEL ophthalmic gel Place  1 application  into both eyes at bedtime.   polyethylene glycol powder (GLYCOLAX /MIRALAX ) 17 GM/SCOOP powder Take 17 g by mouth daily.   pravastatin  (PRAVACHOL ) 40 MG tablet Take 1 tablet (40 mg total) by mouth daily.   PREMPRO  0.45-1.5 MG tablet Take 1 tablet by mouth daily.   sodium chloride  (OCEAN) 0.65 % SOLN nasal spray Place 1 spray into both nostrils 2 (two) times daily.   traZODone  (DESYREL ) 50 MG tablet Take 1 tablet (50 mg total) by mouth at bedtime.   White Petrolatum-Mineral Oil (LUBRICANT PM) OINT Apply 1 g to eye at bedtime.   [DISCONTINUED] diltiazem  (CARDIZEM  CD) 120 MG 24 hr capsule Take 1 capsule (120 mg total) by mouth daily. (Patient taking differently: Take 240 mg by mouth daily.)   Calcium  Carbonate-Vit D-Min (CALTRATE MINIS PLUS MINERALS PO) Take 40 mcg by mouth daily. (Patient not taking: Reported on 12/22/2023)   diltiazem  (CARDIZEM  CD) 120 MG 24 hr capsule Take 2 capsules (240 mg total) by mouth daily.   [DISCONTINUED] fluocinonide  cream (LIDEX ) 0.05 % Apply 1 application topically  daily as needed (Eczema). (Patient not taking: Reported on 12/22/2023)   [DISCONTINUED] traMADol  (ULTRAM ) 50 MG tablet Take 1 tablet (50 mg total) by mouth every 12 (twelve) hours as needed. (Patient not taking: Reported on 12/22/2023)   No facility-administered encounter medications on file as of 12/22/2023.    Review of Systems:  Review of Systems  Health Maintenance  Topic Date Due   FOOT EXAM  Never done   OPHTHALMOLOGY EXAM  Never done   DEXA SCAN  Never done   Pneumococcal Vaccine: 50+ Years (2 of 2 - PCV) 05/01/2019   COVID-19 Vaccine (7 - 2024-25 season) 11/11/2023   INFLUENZA VACCINE  12/01/2023   Medicare Annual Wellness (AWV)  02/06/2024   HEMOGLOBIN A1C  04/02/2024   Diabetic kidney evaluation - Urine ACR  10/01/2024   Diabetic kidney evaluation - eGFR measurement  12/16/2024   MAMMOGRAM  04/12/2025   DTaP/Tdap/Td (3 - Td or Tdap) 08/13/2031   Colonoscopy  11/22/2033   Hepatitis C Screening  Completed   Zoster Vaccines- Shingrix  Completed   HPV VACCINES  Aged Out   Meningococcal B Vaccine  Aged Out    Physical Exam: Vitals:   12/22/23 1028  BP: 126/82  Pulse: 92  Temp: (!) 96.6 F (35.9 C)  SpO2: 99%  Weight: 144 lb (65.3 kg)  Height: 5' 3 (1.6 m)   Body mass index is 25.51 kg/m. Physical Exam Constitutional:      Appearance: Normal appearance.  Cardiovascular:     Rate and Rhythm: Normal rate and regular rhythm.     Pulses: Normal pulses.     Heart sounds: Normal heart sounds.  Pulmonary:     Effort: Pulmonary effort is normal.  Abdominal:     General: Abdomen is flat. Bowel sounds are normal.     Palpations: Abdomen is soft.  Musculoskeletal:        General: No swelling or tenderness.  Skin:    General: Skin is warm and dry.  Neurological:     Mental Status: She is alert and oriented to person, place, and time.     Gait: Gait normal.  Psychiatric:        Mood and Affect: Mood normal.    Physical Exam   MEASUREMENTS: Height- 5 feet 3.5  inches. NEUROLOGICAL: Normal neurological exam      Labs reviewed: Basic Metabolic Panel: Recent Labs    02/06/23  1500 11/24/23 1844 12/17/23 1538 12/17/23 1802  NA 131* 135 139  --   K 4.0 3.7 3.4*  --   CL 97 103 103  --   CO2 26 23 25   --   GLUCOSE 92 111* 119*  --   BUN 13 13 13   --   CREATININE 0.91 0.59 1.04*  --   CALCIUM  9.2 8.9 9.2  --   MG  --   --   --  2.5*  PHOS 3.1  --   --   --   TSH 1.59  --   --   --    Liver Function Tests: Recent Labs    02/06/23 1500 11/24/23 1844  AST 20 28  ALT 20 24  ALKPHOS 43 118  BILITOT 0.4 0.5  PROT 7.0 6.9  ALBUMIN 4.3  4.3 3.8   No results for input(s): LIPASE, AMYLASE in the last 8760 hours. No results for input(s): AMMONIA in the last 8760 hours. CBC: Recent Labs    02/06/23 1500 11/24/23 1844 12/17/23 1538  WBC 7.9 9.7 5.8  HGB 12.3 12.4 11.8*  HCT 37.9 37.4 36.4  MCV 93.1 92.6 93.6  PLT 224.0 381 320   Lipid Panel: Recent Labs    02/06/23 1500 10/02/23 0748  CHOL 184 194  HDL 95.10 88  LDLCALC 77 87  TRIG 58.0 92  CHOLHDL 2 2.2   Lab Results  Component Value Date   HGBA1C 5.7 (H) 10/02/2023    Procedures since last visit: DG Chest 2 View Result Date: 12/17/2023 CLINICAL DATA:  Shortness of breath and tachycardia EXAM: CHEST - 2 VIEW COMPARISON:  12/04/2022 FINDINGS: The cardiomediastinal contours are normal. Aortic atherosclerosis. Chronic interstitial coarsening and scarring at the left lung base. Pulmonary vasculature is normal. No consolidation, pleural effusion, or pneumothorax. No acute osseous abnormalities are seen. IMPRESSION: No acute findings. Chronic interstitial coarsening and scarring at the left lung base. Electronically Signed   By: Andrea Gasman M.D.   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 Scored 24 on the cognitive assessment, indicating mild neurocognitive disorder. Lack of specific treatments for mild neurocognitive dysfunction but emphasized the importance of minimizing medication volume to improve cognitive function. - Consider reducing or discontinuing gabapentin  to improve cognitive function - Provide information on the Mediterranean diet - Discuss potential referral to a neurologist for further evaluation and participation in a memory program  Heart palpitations Reports episodes of heart palpitations, particularly when engaging in activities around the house. Cardiologist increased diltiazem  to 240 mg and started her on a baby aspirin.  Chronic back pain and right knee pain Reports chronic back pain and right knee pain, with a recent fall exacerbating the knee pain. Received a gel shot in the knee and a spinal injection for back pain. Expresses interest in returning to physical therapy for both knee and back pain, as well as for balance improvement. - Provide referral for physical therapy for knee, back, and balance improvement  Fibromyalgia Takes gabapentin  for fibromyalgia, but reducing or discontinuing it could improve cognitive function. Also uses time-release Tylenol  at night for pain management. - Consider reducing or discontinuing gabapentin  to improve cognitive function - Continue time-release Tylenol  at night as needed  Depression and anxiety disorder Currently on Buspar , trazodone , and duloxetine   for depression and anxiety. Significant changes to mood medications in the past six weeks; plan to leave them unchanged for now.  Shortness of breath Reports shortness of breath, particularly when walking.  General Health Maintenance Discussed the importance of diet and exercise, specifically recommending the Mediterranean diet to support cognitive health. Encouraged to reduce sweets and consider joining a memory  program that includes diet and exercise components. - Provide information on the Mediterranean diet - Encourage reduction of sweets in diet - Consider joining a memory program with diet and exercise components          Labs/tests ordered:  * No order type specified * Next appt:  01/24/2024  I spent greater than 40 minutes for the care of this patient in face to face time, chart review, clinical documentation, patient education.

## 2024-01-24 ENCOUNTER — Encounter: Admitting: Student

## 2024-01-27 ENCOUNTER — Ambulatory Visit
Admission: EM | Admit: 2024-01-27 | Discharge: 2024-01-27 | Disposition: A | Discharge status: Home / Self Care | Attending: Emergency Medicine | Admitting: Emergency Medicine

## 2024-01-27 DIAGNOSIS — R8290 Unspecified abnormal findings in urine: Secondary | ICD-10-CM | POA: Insufficient documentation

## 2024-01-27 DIAGNOSIS — J0100 Acute maxillary sinusitis, unspecified: Secondary | ICD-10-CM | POA: Diagnosis not present

## 2024-01-27 LAB — POCT URINE DIPSTICK
Bilirubin, UA: NEGATIVE
Glucose, UA: NEGATIVE mg/dL
Ketones, POC UA: NEGATIVE mg/dL
Nitrite, UA: NEGATIVE
Protein Ur, POC: NEGATIVE mg/dL
Spec Grav, UA: 1.01 (ref 1.010–1.025)
Urobilinogen, UA: 0.2 U/dL
pH, UA: 6 (ref 5.0–8.0)

## 2024-01-27 MED ORDER — AMOXICILLIN-POT CLAVULANATE 875-125 MG PO TABS: 1.0000 | ORAL_TABLET | Freq: Two times a day (BID) | ORAL | 0 refills | Status: AC

## 2024-01-27 MED ORDER — PREDNISONE 10 MG (21) PO TBPK: ORAL_TABLET | Freq: Every day | ORAL | 0 refills | Status: DC

## 2024-01-27 NOTE — Discharge Instructions (Signed)
 Treated for sinus infection  Urinalysis shows Zarria Towell blood cells but at this time does not show bacteria, sample has been sent to the lab for 3 days to see if bacteria is truly present, if this is not notated then you will be notified and additional medicine sent to the pharmacy  Begin Augmentin  twice daily for 10 days for treatment of bacteria causing symptoms to linger  If symptoms have improved but you are has still having significant sinus pressure after 3 days of antibiotic use initiate oral prednisone  to further reduce the sinus pressure so that you are comfortable, take as directed with food, avoid ibuprofen during treatment but may use Tylenol   If you find that after 3 days of antibiotic use symptoms have significantly reduced and you are comfortable you may continue antibiotic without treatment of steroid    You can take Tylenol   as needed for fever reduction and pain relief.   For cough: honey 1/2 to 1 teaspoon (you can dilute the honey in water or another fluid).  You can also use guaifenesin  and dextromethorphan  for cough. You can use a humidifier for chest congestion and cough.  If you don't have a humidifier, you can sit in the bathroom with the hot shower running.      For sore throat: try warm salt water gargles, cepacol lozenges, throat spray, warm tea or water with lemon/honey, popsicles or ice, or OTC cold relief medicine for throat discomfort.   For congestion: take a daily anti-histamine like Zyrtec, Claritin, and a oral decongestant, such as pseudoephedrine.  You can also use Flonase  1-2 sprays in each nostril daily.   It is important to stay hydrated: drink plenty of fluids (water, gatorade/powerade/pedialyte, juices, or teas) to keep your throat moisturized and help further relieve irritation/discomfort.

## 2024-01-27 NOTE — ED Triage Notes (Signed)
 Patient states that she's had her sx for several weeks. Patient has been to her cardiologist and GI dr. Everything is ok with the,. Patient was put on linzess from the GI Dr. Patient states that she's here for her head congestion,chest congestion . When she coughs she states that she has thick yellow mucus. Patient states that she's having fatigue. Patient states that her urine smells really bad for several weeks.

## 2024-01-28 LAB — URINE CULTURE: Culture: NO GROWTH

## 2024-01-28 NOTE — ED Provider Notes (Signed)
 [name]    CSN: [id number] Arrival date & time: 01/27/24  1519      History   Chief Complaint Chief Complaint  Patient presents with   Cough   Fatigue    HPI Andrea Noble is a 73 y.o. female.   Patient presents for evaluation of nasal congestion, rhinorrhea, productive cough with thick clear sputum, intermittent headaches, sinus pressure to the cheeks and the forehead and shortness of breath with exertion present for 2 to 3 weeks.  Has begun to experience dizziness primarily with bending over.  Endorses during this timeframe she has been evaluated by her dentist and cardiologist and those have been ruled out as causes for her symptoms.  Tolerable to food and liquids.  Uses nasal spray daily.  Denies wheezing.  History of asthma.  Endorses feels similar to when she had pneumonia in the past.  Patient concerned with a foul urinary odor occurring over the past 2 to 3 weeks.  Denies pregnancy urgency hematuria abdominal pain.  Has not attempted treatment.  Past Medical History:  Diagnosis Date   Anxiety    Arthritis    Asthma    Cataract    Chronic constipation    Chronic insomnia    Depression    Diverticulitis    GAD (generalized anxiety disorder)    GERD (gastroesophageal reflux disease)    Hodgkin's lymphoma (HCC) 2006   underwent radiation and chemo, last CT scan since moving to [place] from [place] 2018   Hyperlipidemia    Hypertension    Hypothyroidism    Menopausal syndrome    OAB (overactive bladder)    Paresthesia    Pneumonia    PSVT (paroxysmal supraventricular tachycardia)    Seasonal allergies    Sleep apnea    SVT (supraventricular tachycardia)    Thyroid  disease    hypothyroid    Patient Active Problem List   Diagnosis Date Noted   Low back pain 10/25/2023   MDD (major depressive disorder), recurrent episode, moderate (HCC) 09/16/2023   Diabetes due to undrl condition w oth diabetic neuro comp (HCC) 09/16/2023   OSA (obstructive sleep apnea)  01/20/2023   Arthralgia of right knee 11/14/2022   Arthritis of right hip 11/14/2022   Arthritis of right knee 11/14/2022   Pain of right hip joint 11/14/2022   Hyponatremia 08/15/2022   Hypothyroidism 02/04/2022   Essential hypertension 03/24/2021   Preventative health care 02/03/2021   Mood disorder 02/03/2021   Atherosclerosis of aorta 02/03/2021   Intermittent asthma, well controlled 07/31/2020   SVT (supraventricular tachycardia)    Hyperlipidemia    Menopausal syndrome    OAB (overactive bladder)    Seasonal allergies    Lumbar radiculopathy 07/06/2020   Chronic shoulder bursitis, right 10/14/2019   Primary osteoarthritis of both hands 04/30/2018   Postmenopausal 04/30/2018   Arthralgia 12/12/2017   GAD (generalized anxiety disorder) 12/12/2017   Chronic constipation 11/13/2017   Asthma, stable, moderate persistent 05/15/2017   Chronic insomnia 05/15/2017   Hot flashes, menopausal 05/15/2017   Depression, major, single episode, complete remission 03/14/2017   Gastroesophageal reflux disease without esophagitis 03/14/2017   Pure hypercholesterolemia 03/14/2017   History of Hodgkin's lymphoma 03/01/2017    Past Surgical History:  Procedure Laterality Date   BACK SURGERY     basal cell carcinoma surgery     BLEPHAROPLASTY Bilateral    BREAST BIOPSY     benign   BREAST BIOPSY  07/27/2018   us  core bx venus COLUMNAR CELL  METAPLASIA, AND PSEUDOANGIOMATOUS STROMAL   CARPAL TUNNEL RELEASE Right    CARPAL TUNNEL RELEASE Left    CATARACT EXTRACTION Bilateral 2015   CLEFT LIP REPAIR     COLON SURGERY     COLONOSCOPY N/A 11/23/2023   Procedure: COLONOSCOPY;  Surgeon: Onita Elspeth Sharper, DO;  Location[facility] ENDOSCOPY;  Service: Gastroenterology;  Laterality: N/A;  DM   COLONOSCOPY WITH PROPOFOL  N/A 07/09/2018   Procedure: COLONOSCOPY WITH PROPOFOL ;  Surgeon: Gaylyn Gladis PENNER, MD;  Location: [facility] ENDOSCOPY;  Service: Endoscopy;  Laterality: N/A;   DE QUERVAIN'S RELEASE  Bilateral    DIAGNOSTIC LAPAROSCOPY     endometriosis   ESOPHAGOGASTRODUODENOSCOPY N/A 07/09/2018   Procedure: ESOPHAGOGASTRODUODENOSCOPY (EGD);  Surgeon: Gaylyn Gladis PENNER, MD;  Location: [facility] ENDOSCOPY;  Service: Endoscopy;  Laterality: N/A;   ESOPHAGOGASTRODUODENOSCOPY N/A 11/23/2023   Procedure: EGD (ESOPHAGOGASTRODUODENOSCOPY);  Surgeon: Onita Elspeth Sharper, DO;  Location: [facility] ENDOSCOPY;  Service: Gastroenterology;  Laterality: N/A;   EYE SURGERY     FINGER SURGERY Right    Middle finger joint replaced   JOINT REPLACEMENT     MAXIMUM ACCESS (MAS) TRANSFORAMINAL LUMBAR INTERBODY FUSION (TLIF) 2 LEVEL Right 07/06/2020   Procedure: L4/5, L5/S1 TRANSFORAMINAL LUMBAR INTERBODY FUSION (TLIF), L4-S1 PEDICLE SCREWS,DECOMPRESSION ;  Surgeon: Bluford Elspeth, MD;  Location: [facility] ORS;  Service: Neurosurgery;  Laterality: Right;   POLYPECTOMY  11/23/2023   Procedure: POLYPECTOMY, INTESTINE;  Surgeon: Onita Elspeth Sharper, DO;  Location: [facility] ENDOSCOPY;  Service: Gastroenterology;;   THUMB SURGERY Right    took tendon from forearm and wrapped it around the thumb joint    OB History   No obstetric history on file.      Home Medications    Prior to Admission medications   Medication Sig Start Date End Date Taking? Authorizing Provider  acetaminophen  (TYLENOL ) 650 MG CR tablet Take 1 tablet (650 mg total) by mouth at bedtime. 09/29/23  Yes Abdul Fine, MD  albuterol  (VENTOLIN  HFA) 108 [phone] Base) MCG/ACT inhaler Inhale 2 puffs into the lungs every 6 (six) hours as needed for wheezing or shortness of breath. 11/30/23  Yes Eubanks, Jessica K, NP  amoxicillin -clavulanate (AUGMENTIN ) 875-125 MG tablet Take 1 tablet by mouth every 12 (twelve) hours for 10 days. 01/27/24 02/06/24 Yes Livy Ross R, NP  azelastine  (ASTELIN ) 0.1 % nasal spray USE 2 SPRAYS IN EACH NOSTRIL TWICE A DAY AS DIRECTED 03/10/23  Yes Jimmy Charlie FERNS, MD  busPIRone  (BUSPAR ) 15 MG tablet Take 1 tablet (15 mg total) by mouth 2  (two) times daily. 09/29/23  Yes Beamer, Fine, MD  Carboxymethylcellulose Sodium (THERATEARS) 0.25 % SOLN Place 1 drop into both eyes daily as needed (Dry eyes).   Yes [provider]  diclofenac  Sodium (VOLTAREN ) 1 % GEL Apply 1 application topically 2 (two) times daily. 03/04/20  Yes [provider]  diltiazem  (CARDIZEM  CD) 120 MG 24 hr capsule Take 2 capsules (240 mg total) by mouth daily. 12/22/23 03/21/24 Yes Beamer, Fine, MD  DULoxetine  (CYMBALTA ) 20 MG capsule TAKE 1 CAPSULE(20 MG) BY MOUTH TWICE DAILY 11/20/23  Yes Beamer, [place], MD  fluticasone -salmeterol (WIXELA INHUB) 100-50 MCG/ACT AEPB Inhale 1 puff into the lungs 2 (two) times daily. 09/29/23  Yes Abdul Fine, MD  gabapentin  (NEURONTIN ) 100 MG capsule Take 1 capsule (100 mg total) by mouth daily as needed. 09/29/23  Yes Abdul Fine, MD  levothyroxine  (SYNTHROID ) 50 MCG tablet Take 1-1.5 tablets (50-75 mcg total) by mouth See admin instructions. Take 75 mg every  Monday, Wednesday and  Friday Take 50 mg all the other days 09/29/23  Yes Beamer, [place], MD  linaclotide [facility]) 290 MCG CAPS capsule Take 290 mcg by mouth. 01/25/24  Yes [provider]  liothyronine  (CYTOMEL ) 5 MCG tablet Take 0.5 tablets (2.5 mcg total) by mouth daily. 09/29/23  Yes Abdul Fine, MD  Melatonin 10 MG TABS Take 10 mg by mouth at bedtime. Timed-Release 09/29/23  Yes Beamer, Fine, MD  montelukast  (SINGULAIR ) 10 MG tablet Take 1 tablet (10 mg total) by mouth at bedtime. 09/29/23  Yes Abdul Fine, MD  Multiple Vitamin (MULTIVITAMIN) tablet Take 1 tablet by mouth daily. Vita Fusion adult Gummie 09/29/23  Yes Beamer, [place], MD  pantoprazole  (PROTONIX ) 40 MG tablet Take 1 tablet (40 mg total) by mouth every morning. 10/20/23  Yes Beamer, [place], MD  Polyethyl Glycol-Propyl Glycol (SYSTANE) 0.4-0.3 % GEL ophthalmic gel Place 1 application  into both eyes at bedtime.   Yes [provider]  polyethylene  glycol powder (GLYCOLAX /MIRALAX ) 17 GM/SCOOP powder Take 17 g by mouth daily. 09/29/23  Yes Abdul Fine, MD  pravastatin  (PRAVACHOL ) 40 MG tablet Take 1 tablet (40 mg total) by mouth daily. 09/29/23  Yes Beamer, Fine, MD  predniSONE  (STERAPRED UNI-PAK 21 TAB) 10 MG (21) TBPK tablet Take by mouth daily. Take 6 tabs by mouth daily  for 1 days, then 5 tabs for 1 days, then 4 tabs for 1 days, then 3 tabs for 1 days, 2 tabs for 1 days, then 1 tab by mouth daily for 1 days 01/27/24  Yes Khaniyah Bezek R, NP  PREMPRO  0.45-1.5 MG tablet Take 1 tablet by mouth daily. 09/29/23  Yes Beamer, Fine, MD  sodium chloride  (OCEAN) 0.65 % SOLN nasal spray Place 1 spray into both nostrils 2 (two) times daily.   Yes [provider]  traZODone  (DESYREL ) 50 MG tablet Take 1 tablet (50 mg total) by mouth at bedtime. 09/29/23  Yes Beamer, Fine, MD  Consandra Laske Petrolatum-Mineral Oil (LUBRICANT PM) OINT Apply 1 g to eye at bedtime. 09/29/23  Yes Abdul Fine, MD    Family History Family History  Problem Relation Age of Onset   Breast cancer Maternal Aunt    Anuerysm Mother    Stroke Mother    Heart attack Father    Alzheimer's disease Father    Developmental delay Brother     Social History Social History   Tobacco Use   Smoking status: Never    Passive exposure: Past   Smokeless tobacco: Never  Vaping Use   Vaping status: Never Used  Substance Use Topics   Alcohol  use: Yes    Comment: very occasional once a week   Drug use: No     Allergies   Levofloxacin and Other   Review of Systems Review of Systems  Respiratory:  Positive for cough.      Physical Exam Triage Vital Signs ED Triage Vitals  Encounter Vitals Group     BP 01/27/24 1537 (!) 145/70     Girls Systolic BP Percentile --      Girls Diastolic BP Percentile --      Boys Systolic BP Percentile --      Boys Diastolic BP Percentile --      Pulse Rate 01/27/24 1537 94     Resp 01/27/24 1537 19     Temp 01/27/24  1537 98.4 F (36.9 C)     Temp Source 01/27/24 1537 Oral     SpO2 01/27/24 1537 98 %  Weight --      Height --      Head Circumference --      Peak Flow --      Pain Score 01/27/24 1536 0     Pain Loc --      Pain Education --      Exclude from Growth Chart --    No data found.  Updated Vital Signs BP (!) 145/70 (BP Location: Left Arm)   Pulse 94   Temp 98.4 F (36.9 C) (Oral)   Resp 19   SpO2 98%   Visual Acuity Right Eye Distance:   Left Eye Distance:   Bilateral Distance:    Right Eye Near:   Left Eye Near:    Bilateral Near:     Physical Exam Constitutional:      Appearance: Normal appearance.  HENT:     Head: Normocephalic.     Right Ear: Tympanic membrane, ear canal and external ear normal.     Left Ear: Tympanic membrane, ear canal and external ear normal.     Nose: Congestion present.     Right Sinus: Maxillary sinus tenderness present.     Left Sinus: Maxillary sinus tenderness present.     Mouth/Throat:     Pharynx: No oropharyngeal exudate or posterior oropharyngeal erythema.  Cardiovascular:     Rate and Rhythm: Normal rate and regular rhythm.     Pulses: Normal pulses.     Heart sounds: Normal heart sounds.  Pulmonary:     Effort: Pulmonary effort is normal.     Breath sounds: Normal breath sounds.  Musculoskeletal:     Cervical back: Normal range of motion and neck supple.  Neurological:     Mental Status: She is alert and oriented to person, place, and time. Mental status is at baseline.      UC Treatments / Results  Labs (all labs ordered are listed, but only abnormal results are displayed) Labs Reviewed  POCT URINE DIPSTICK - Abnormal; Notable for the following components:      Result Value   Blood, UA trace-intact (*)    Leukocytes, UA Trace (*)    All other components within normal limits  URINE CULTURE    EKG   Radiology No results found.  Procedures Procedures (including critical care time)  Medications Ordered in  UC Medications - No data to display  Initial Impression / Assessment and Plan / UC Course  I have reviewed the triage vital signs and the nursing notes.  Pertinent labs & imaging results that were available during my care of the patient were reviewed by me and considered in my medical decision making (see chart for details).  Acute nonrecurrent maxillary sinusitis, abnormal urine odor  Patient is in no signs of distress nor toxic appearing.  Vital signs are stable.  Low suspicion for pneumonia, pneumothorax or bronchitis and therefore will defer imaging.  Viral testing deferred illness.  Symptoms persisting for 2 to 3 weeks, on presentation consistent with a sinusitis therefore initiating antibiotics, prescribed Augmentin  as well as prednisone .  Urinalysis showed leukocytes but negative for nitrates, sent for culture will initiate antibiotics based on culture results, discussed this with patient.May use additional over-the-counter medications as needed for supportive care.  May follow-up with urgent care as needed if symptoms persist or worsen.   Final Clinical Impressions(s) / UC Diagnoses   Final diagnoses:  Abnormal urine odor  Acute non-recurrent maxillary sinusitis     Discharge Instructions  Treated for sinus infection  Urinalysis shows Srah Ake blood cells but at this time does not show bacteria, sample has been sent to the lab for 3 days to see if bacteria is truly present, if this is not notated then you will be notified and additional medicine sent to the pharmacy  Begin Augmentin  twice daily for 10 days for treatment of bacteria causing symptoms to linger  If symptoms have improved but you are has still having significant sinus pressure after 3 days of antibiotic use initiate oral prednisone  to further reduce the sinus pressure so that you are comfortable, take as directed with food, avoid ibuprofen during treatment but may use Tylenol   If you find that after 3 days of  antibiotic use symptoms have significantly reduced and you are comfortable you may continue antibiotic without treatment of steroid    You can take Tylenol   as needed for fever reduction and pain relief.   For cough: honey 1/2 to 1 teaspoon (you can dilute the honey in water or another fluid).  You can also use guaifenesin  and dextromethorphan  for cough. You can use a humidifier for chest congestion and cough.  If you don't have a humidifier, you can sit in the bathroom with the hot shower running.      For sore throat: try warm salt water gargles, cepacol lozenges, throat spray, warm tea or water with lemon/honey, popsicles or ice, or OTC cold relief medicine for throat discomfort.   For congestion: take a daily anti-histamine like Zyrtec, Claritin, and a oral decongestant, such as pseudoephedrine.  You can also use Flonase  1-2 sprays in each nostril daily.   It is important to stay hydrated: drink plenty of fluids (water, gatorade/powerade/pedialyte, juices, or teas) to keep your throat moisturized and help further relieve irritation/discomfort.    ED Prescriptions     Medication Sig Dispense Auth. Provider   amoxicillin -clavulanate (AUGMENTIN ) 875-125 MG tablet Take 1 tablet by mouth every 12 (twelve) hours for 10 days. 20 tablet Marygrace Sandoval R, NP   predniSONE  (STERAPRED UNI-PAK 21 TAB) 10 MG (21) TBPK tablet Take by mouth daily. Take 6 tabs by mouth daily  for 1 days, then 5 tabs for 1 days, then 4 tabs for 1 days, then 3 tabs for 1 days, 2 tabs for 1 days, then 1 tab by mouth daily for 1 days 21 tablet Monnica Saltsman, Shelba SAUNDERS, NP      PDMP not reviewed this encounter.   Teresa Shelba SAUNDERS, [place] 01/28/24 [phone]

## 2024-01-29 ENCOUNTER — Ambulatory Visit (HOSPITAL_COMMUNITY): Payer: Self-pay

## 2024-01-30 ENCOUNTER — Encounter: Payer: Self-pay | Admitting: Nurse Practitioner

## 2024-01-30 ENCOUNTER — Non-Acute Institutional Stay: Admitting: Nurse Practitioner

## 2024-01-30 VITALS — BP 136/74 | HR 92 | Temp 97.6°F | Ht 63.0 in | Wt 142.6 lb

## 2024-01-30 DIAGNOSIS — F411 Generalized anxiety disorder: Secondary | ICD-10-CM | POA: Diagnosis not present

## 2024-01-30 DIAGNOSIS — J4540 Moderate persistent asthma, uncomplicated: Secondary | ICD-10-CM

## 2024-01-30 DIAGNOSIS — E785 Hyperlipidemia, unspecified: Secondary | ICD-10-CM

## 2024-01-30 DIAGNOSIS — K219 Gastro-esophageal reflux disease without esophagitis: Secondary | ICD-10-CM

## 2024-01-30 DIAGNOSIS — F331 Major depressive disorder, recurrent, moderate: Secondary | ICD-10-CM

## 2024-01-30 DIAGNOSIS — I471 Supraventricular tachycardia, unspecified: Secondary | ICD-10-CM

## 2024-01-30 DIAGNOSIS — M19041 Primary osteoarthritis, right hand: Secondary | ICD-10-CM

## 2024-01-30 DIAGNOSIS — M19042 Primary osteoarthritis, left hand: Secondary | ICD-10-CM

## 2024-01-30 DIAGNOSIS — F5104 Psychophysiologic insomnia: Secondary | ICD-10-CM | POA: Diagnosis not present

## 2024-01-30 DIAGNOSIS — E039 Hypothyroidism, unspecified: Secondary | ICD-10-CM

## 2024-01-30 DIAGNOSIS — I10 Essential (primary) hypertension: Secondary | ICD-10-CM

## 2024-01-30 MED ORDER — ASPIRIN 81 MG PO TBEC: 81.0000 mg | DELAYED_RELEASE_TABLET | Freq: Every day | ORAL | 1 refills | Status: AC

## 2024-01-30 NOTE — Progress Notes (Signed)
 Careteam: Patient Care Team: Abdul Fine, MD as PCP - General (Family Medicine) Laurice Francis NOVAK, OD (Optometry)  PLACE OF SERVICE:  [facility]  Advanced Directive information    Allergies  Allergen Reactions   Levofloxacin     IV Levaquin causes burning   Other Itching    Surgical glue    Chief Complaint  Patient presents with   Medical Management of Chronic Issues    Medical Management of Chronic Issues. 1 Month follow up. Complains of Bloating. Went to Urgent Care 9/27, Sinusitis    HPI: The patient is a 73 year old female presenting for a routine follow-up of chronic conditions. She recently experienced a sinus infection and is currently on Augmentin  and prednisone , though the course is not yet complete. She reports improvement in symptoms and notes that prednisone  has not affected her sleep. She remains at her baseline and continues to take melatonin and trazodone  at night, which she finds helpful.  She was recently started on Linzess by her GI specialist but reports no significant change in bowel movements. She experienced bloating yesterday and ate minimally as a result. She has a known history of diverticulosis and underwent a recent colonoscopy that revealed a polyp, prompting her GI follow-up.she has also added miralax  to help with constipation  Cardiology follow-up was completed recently due to use of a Holter monitor; no cardiac issues were identified. She states her Cardizem  dosage was increased due to tachycardia nd she has started taking 81 mg aspirin daily, though this has not yet been documented in the [facility].  She visited urgent care for sinus pain, where a urinalysis was performed due to concerns about strong-smelling urine. She also reports intermittent low back pain. She acknowledges suboptimal fluid intake and notes that her skin is thin and bruises easily; she occasionally wears geriatric gloves for protection.  She denies any changes in vision or  headaches. She uses Systane preservative-free eye drops. She has arthritis in both hands and expresses interest in seeing a hand specialist due to decreased fingertip sensitivity, frequent dropping of objects, and mild tremors.  Review of Systems:  Review of Systems  Constitutional: Negative.   HENT: Negative.    Respiratory: Negative.    Cardiovascular:  Positive for palpitations.  Gastrointestinal: Negative.   Genitourinary: Negative.   Musculoskeletal: Negative.   Neurological: Negative.   Psychiatric/Behavioral: Negative.     Past Medical History:  Diagnosis Date   Anxiety    Arthritis    Asthma    Cataract    Chronic constipation    Chronic insomnia    Depression    Diverticulitis    GAD (generalized anxiety disorder)    GERD (gastroesophageal reflux disease)    Hodgkin's lymphoma (HCC) 2006   underwent radiation and chemo, last CT scan since moving to [place] from [place] 2018   Hyperlipidemia    Hypertension    Hypothyroidism    Menopausal syndrome    OAB (overactive bladder)    Paresthesia    Pneumonia    PSVT (paroxysmal supraventricular tachycardia)    Seasonal allergies    Sleep apnea    SVT (supraventricular tachycardia)    Thyroid  disease    hypothyroid   Past Surgical History:  Procedure Laterality Date   BACK SURGERY     basal cell carcinoma surgery     BLEPHAROPLASTY Bilateral    BREAST BIOPSY     benign   BREAST BIOPSY  07/27/2018   us  core bx venus COLUMNAR CELL METAPLASIA,  AND PSEUDOANGIOMATOUS STROMAL   CARPAL TUNNEL RELEASE Right    CARPAL TUNNEL RELEASE Left    CATARACT EXTRACTION Bilateral 2015   CLEFT LIP REPAIR     COLON SURGERY     COLONOSCOPY N/A 11/23/2023   Procedure: COLONOSCOPY;  Surgeon: Onita Elspeth Sharper, DO;  Location: [facility] ENDOSCOPY;  Service: Gastroenterology;  Laterality: N/A;  DM   COLONOSCOPY WITH PROPOFOL  N/A 07/09/2018   Procedure: COLONOSCOPY WITH PROPOFOL ;  Surgeon: Gaylyn Gladis PENNER, MD;  Location: [facility] ENDOSCOPY;   Service: Endoscopy;  Laterality: N/A;   DE QUERVAIN'S RELEASE Bilateral    DIAGNOSTIC LAPAROSCOPY     endometriosis   ESOPHAGOGASTRODUODENOSCOPY N/A 07/09/2018   Procedure: ESOPHAGOGASTRODUODENOSCOPY (EGD);  Surgeon: Gaylyn Gladis PENNER, MD;  Location: [facility] ENDOSCOPY;  Service: Endoscopy;  Laterality: N/A;   ESOPHAGOGASTRODUODENOSCOPY N/A 11/23/2023   Procedure: EGD (ESOPHAGOGASTRODUODENOSCOPY);  Surgeon: Onita Elspeth Sharper, DO;  Location: [facility] ENDOSCOPY;  Service: Gastroenterology;  Laterality: N/A;   EYE SURGERY     FINGER SURGERY Right    Middle finger joint replaced   JOINT REPLACEMENT     MAXIMUM ACCESS (MAS) TRANSFORAMINAL LUMBAR INTERBODY FUSION (TLIF) 2 LEVEL Right 07/06/2020   Procedure: L4/5, L5/S1 TRANSFORAMINAL LUMBAR INTERBODY FUSION (TLIF), L4-S1 PEDICLE SCREWS,DECOMPRESSION ;  Surgeon: Bluford Elspeth, MD;  Location: [facility] ORS;  Service: Neurosurgery;  Laterality: Right;   POLYPECTOMY  11/23/2023   Procedure: POLYPECTOMY, INTESTINE;  Surgeon: Onita Elspeth Sharper, DO;  Location: [facility] ENDOSCOPY;  Service: Gastroenterology;;   THUMB SURGERY Right    took tendon from forearm and wrapped it around the thumb joint   Social History:   reports that she has never smoked. She has been exposed to tobacco smoke. She has never used smokeless tobacco. She reports current alcohol  use. She reports that she does not use drugs.  Family History  Problem Relation Age of Onset   Breast cancer Maternal Aunt    Anuerysm Mother    Stroke Mother    Heart attack Father    Alzheimer's disease Father    Developmental delay Brother    Medications: Patient's Medications  New Prescriptions   No medications on file  Previous Medications   ACETAMINOPHEN  (TYLENOL ) 650 MG CR TABLET    Take 1 tablet (650 mg total) by mouth at bedtime.   ALBUTEROL  (VENTOLIN  HFA) 108 (90 BASE) MCG/ACT INHALER    Inhale 2 puffs into the lungs every 6 (six) hours as needed for wheezing or shortness of breath.    AMOXICILLIN -CLAVULANATE (AUGMENTIN ) 875-125 MG TABLET    Take 1 tablet by mouth every 12 (twelve) hours for 10 days.   AZELASTINE  (ASTELIN ) 0.1 % NASAL SPRAY    USE 2 SPRAYS IN EACH NOSTRIL TWICE A DAY AS DIRECTED   BUSPIRONE  (BUSPAR ) 15 MG TABLET    Take 1 tablet (15 mg total) by mouth 2 (two) times daily.   CARBOXYMETHYLCELLULOSE SODIUM (THERATEARS) 0.25 % SOLN    Place 1 drop into both eyes daily as needed (Dry eyes).   DICLOFENAC  SODIUM (VOLTAREN ) 1 % GEL    Apply 1 application topically 2 (two) times daily.   DILTIAZEM  (CARDIZEM  CD) 120 MG 24 HR CAPSULE    Take 2 capsules (240 mg total) by mouth daily.   DULOXETINE  (CYMBALTA ) 20 MG CAPSULE    TAKE 1 CAPSULE(20 MG) BY MOUTH TWICE DAILY   FLUTICASONE -SALMETEROL (WIXELA INHUB) 100-50 MCG/ACT AEPB    Inhale 1 puff into the lungs 2 (two) times daily.   GABAPENTIN  (NEURONTIN ) 100 MG CAPSULE  Take 1 capsule (100 mg total) by mouth daily as needed.   LEVOTHYROXINE  (SYNTHROID ) 50 MCG TABLET    Take 1-1.5 tablets (50-75 mcg total) by mouth See admin instructions. Take 75 mg every  Monday, Wednesday and Friday Take 50 mg all the other days   LINACLOTIDE (LINZESS) 290 MCG CAPS CAPSULE    Take 290 mcg by mouth.   LIOTHYRONINE  (CYTOMEL ) 5 MCG TABLET    Take 0.5 tablets (2.5 mcg total) by mouth daily.   MELATONIN 10 MG TABS    Take 10 mg by mouth at bedtime. Timed-Release   MONTELUKAST  (SINGULAIR ) 10 MG TABLET    Take 1 tablet (10 mg total) by mouth at bedtime.   MULTIPLE VITAMIN (MULTIVITAMIN) TABLET    Take 1 tablet by mouth daily. Vita Fusion adult Gummie   PANTOPRAZOLE  (PROTONIX ) 40 MG TABLET    Take 1 tablet (40 mg total) by mouth every morning.   POLYETHYL GLYCOL-PROPYL GLYCOL (SYSTANE) 0.4-0.3 % GEL OPHTHALMIC GEL    Place 1 application  into both eyes at bedtime.   POLYETHYLENE GLYCOL POWDER (GLYCOLAX /MIRALAX ) 17 GM/SCOOP POWDER    Take 17 g by mouth daily.   PRAVASTATIN  (PRAVACHOL ) 40 MG TABLET    Take 1 tablet (40 mg total) by mouth daily.    PREDNISONE  (STERAPRED UNI-PAK 21 TAB) 10 MG (21) TBPK TABLET    Take by mouth daily. Take 6 tabs by mouth daily  for 1 days, then 5 tabs for 1 days, then 4 tabs for 1 days, then 3 tabs for 1 days, 2 tabs for 1 days, then 1 tab by mouth daily for 1 days   PREMPRO  0.45-1.5 MG TABLET    Take 1 tablet by mouth daily.   SODIUM CHLORIDE  (OCEAN) 0.65 % SOLN NASAL SPRAY    Place 1 spray into both nostrils 2 (two) times daily.   TRAZODONE  (DESYREL ) 50 MG TABLET    Take 1 tablet (50 mg total) by mouth at bedtime.   WHITE PETROLATUM-MINERAL OIL (LUBRICANT PM) OINT    Apply 1 g to eye at bedtime.  Modified Medications   No medications on file  Discontinued Medications   No medications on file   Physical Exam:  Vitals:   01/30/24 1101  BP: 136/74  Pulse: 92  Temp: 97.6 F (36.4 C)  SpO2: 99%  Weight: 142 lb 9.6 oz (64.7 kg)  Height: 5' 3 (1.6 m)   Body mass index is 25.26 kg/m. Wt Readings from Last 3 Encounters:  01/30/24 142 lb 9.6 oz (64.7 kg)  12/22/23 144 lb (65.3 kg)  11/30/23 143 lb 9.6 oz (65.1 kg)   Physical Exam Constitutional:      Appearance: Normal appearance.  HENT:     Head: Normocephalic and atraumatic.     Nose: Nose normal.     Mouth/Throat:     Mouth: Mucous membranes are moist.     Pharynx: Oropharynx is clear.  Eyes:     Conjunctiva/sclera: Conjunctivae normal.  Cardiovascular:     Rate and Rhythm: Normal rate and regular rhythm.     Pulses: Normal pulses.     Heart sounds: Normal heart sounds.  Pulmonary:     Effort: Pulmonary effort is normal.     Breath sounds: Normal breath sounds.  Abdominal:     General: Bowel sounds are normal.     Palpations: Abdomen is soft.  Musculoskeletal:        General: Normal range of motion.  Skin:    General:  Skin is warm.     Capillary Refill: Capillary refill takes 2 to 3 seconds.  Neurological:     General: No focal deficit present.     Mental Status: She is alert and oriented to person, place, and time.      Motor: Weakness present.  Psychiatric:        Mood and Affect: Mood normal.        Behavior: Behavior normal.    Labs reviewed: Basic Metabolic Panel: Recent Labs    02/06/23 1500 11/24/23 1844 12/17/23 1538 12/17/23 1802  NA 131* 135 139  --   K 4.0 3.7 3.4*  --   CL 97 103 103  --   CO2 26 23 25   --   GLUCOSE 92 111* 119*  --   BUN 13 13 13   --   CREATININE 0.91 0.59 1.04*  --   CALCIUM  9.2 8.9 9.2  --   MG  --   --   --  2.5*  PHOS 3.1  --   --   --   TSH 1.59  --   --   --    Liver Function Tests: Recent Labs    02/06/23 1500 11/24/23 1844  AST 20 28  ALT 20 24  ALKPHOS 43 118  BILITOT 0.4 0.5  PROT 7.0 6.9  ALBUMIN 4.3  4.3 3.8   No results for input(s): LIPASE, AMYLASE in the last 8760 hours. No results for input(s): AMMONIA in the last 8760 hours. CBC: Recent Labs    02/06/23 1500 11/24/23 1844 12/17/23 1538  WBC 7.9 9.7 5.8  HGB 12.3 12.4 11.8*  HCT 37.9 37.4 36.4  MCV 93.1 92.6 93.6  PLT 224.0 381 320   Lipid Panel: Recent Labs    02/06/23 1500 10/02/23 0748  CHOL 184 194  HDL 95.10 88  LDLCALC 77 87  TRIG 58.0 92  CHOLHDL 2 2.2   TSH: Recent Labs    02/06/23 1500  TSH 1.59   A1C: Lab Results  Component Value Date   HGBA1C 5.7 (H) 10/02/2023   Assessment/Plan 1. Essential hypertension (Primary) - Basic Metabolic Panel (without GFR) ordered - Last creatinine slightly elevated; will recheck- encouraged hydration  - Continue aspirin EC 81 mg daily (Dispense: 90 tablets; Refill: 1) - Blood pressure stable; continue monitoring   3. MDD (major depressive disorder), recurrent episode, moderate (HCC) - Patient at baseline - Continue Trazodone  and cymbalta  as ordered  4. GAD (generalized anxiety disorder) - Patient at baseline - Continue Cymbalta  and Buspirone  as prescribed  5. Chronic insomnia - Reinforced importance of sleep hygiene - Continue Melatonin and Trazodone  as prescribed  6. SVT (supraventricular  tachycardia) - Recently evaluated by cardiology - Continue Cardizem  as prescribed -ASA 81 mg daily recommended   7. Asthma, stable, moderate persistent - Condition stable - Continue Albuterol  and Wixela as prescribed  8. Hypothyroidism, unspecified type - Condition stable - Continue Levothyroxine  and Cytomel  as prescribed - TSH last checked October 2024; recheck at next labs - T3 normal (June 2025)  9. Gastroesophageal reflux disease without esophagitis - Chronic and stable - Continue Protonix  as prescribed  10. Hyperlipidemia, unspecified hyperlipidemia type - Continue Pravastatin  as prescribed - Lipid profile stable (June 2025)  11. Primary osteoarthritis of both hands - Continue Tylenol  and Voltaren  gel as needed for pain  Maintain current medication regimen and monitoring. Keep routine follow-up appointment as scheduled.  Waylan Rabon, RN DNP-AGPCNP Student I personally was present during the history, physical  exam and medical decision-making activities of this service and have verified that the service and findings are accurately documented in the student's note Akeiba Axelson K. Caro BODILY [facility] [phone]

## 2024-02-01 ENCOUNTER — Ambulatory Visit: Payer: Self-pay | Admitting: Nurse Practitioner

## 2024-02-01 LAB — BASIC METABOLIC PANEL WITHOUT GFR
BUN: 12 mg/dL (ref 7–25)
CO2: 28 mmol/L (ref 20–32)
Calcium: 9.6 mg/dL (ref 8.6–10.4)
Chloride: 101 mmol/L (ref 98–110)
Creat: 0.69 mg/dL (ref 0.60–1.00)
Glucose, Bld: 92 mg/dL (ref 65–99)
Potassium: 4.6 mmol/L (ref 3.5–5.3)
Sodium: 137 mmol/L (ref 135–146)

## 2024-02-08 ENCOUNTER — Telehealth: Payer: Self-pay

## 2024-02-08 NOTE — Telephone Encounter (Signed)
 Copied from CRM [id number]. Topic: Clinical - Request for Lab/Test Order >> Feb 08, 2024 11:57 AM Logan F wrote: Reason for CRM: Jerel from [facility] rehab called stating she has sent 2 PT evals over to office on 9/12 and today. Waiting to hear back. Please contact Jerel at [phone]

## 2024-02-08 NOTE — Telephone Encounter (Signed)
 Left a message on VM advising them Dr Jimmy retired at the [facility] August and this pt has seen the NP at [facility] several times since her OV with Dr Jimmy in March 2025. May want to reach out to them for orders.

## 2024-02-29 ENCOUNTER — Other Ambulatory Visit: Payer: Self-pay | Admitting: "Endocrinology

## 2024-02-29 DIAGNOSIS — Z1231 Encounter for screening mammogram for malignant neoplasm of breast: Secondary | ICD-10-CM

## 2024-02-29 NOTE — Progress Notes (Addendum)
 History of present illness Andrea Noble is seen today for follow-up hypothyroidism and hot flashes.  She is a 73 y.o. female who went through the menopause in the late 1990s.  She developed hot flashes that were very severe, so she was started on hormone replacement therapy.  Over the years, she has had her dose lowered and increased again as the hot flashes always worsen when she cuts the dose.  I last saw her in 10/24.  She says her old pcp left and she can't get in to her new pcp until January.  She recently felt a lump in her breast so asks for a referral for a mammogram at [facility].   She is currently on LT4 75 mcg M,W,F (one and 1/2 50 mcg tab) and 50 mcg (one tab) the remaining 4 days of the week (~60 mcg daily).  She is also on Cytomel  2.5 mcg daily.  She feels better on combination therapy.  Her energy is ok. She has no anterior neck pain/swelling.  She is concerned about her thyroid .   She also takes Prempro  0.45/1.5 mg daily.  This dose keeps her hot flashes at bay.    ROS:  No chest pain.  No shortness of breath.   Medical History: Past Medical History:  Diagnosis Date  . Allergic state   . Anxiety    mild  . Arrhythmia 2016  . Arthritis 1995  . Asthma without status asthmaticus (HHS-HCC)   . Colon polyp   . Depression   . Diverticulitis   . Essential hypertension 03/24/2021  . GERD (gastroesophageal reflux disease)   . History of abnormal cervical Pap smear Years ago  . History of cataract Surgery 2015  . History of Hodgkin's lymphoma 03/01/2017  . Hodgkin's lymphoma (CMS/HHS-HCC) 2005   s/p chemo and RT  . Hyperlipidemia   . OSA on CPAP 03/22/2023  . Thyroid  disease 2006   Hypothyroid    Surgical History: Past Surgical History:  Procedure Laterality Date  . COLONOSCOPY  07/09/2018   Tubular adenoma of the colon/Sessile serrated polyp/Repeat 2yrs/MUS  . EGD  07/09/2018   GERD/Fundic gland polyp/PHx Barrett's/Repeat 51yrs/MUS  . L4-5 TRANSFORAMINAL  LUMBAR INTERBODY FUSION AND L4-S1 PEDICLE SCREW FIXATION  07/06/2020   Dr. Elspeth Ahle at [facility], [place]  . [email or web address]   11/23/2023   FGP/HP/Gastritis/Rpt55yrs/SMR  . [facility]   11/23/2023   Gastritis/NoRpt/SMR  . basal cell carcinoma surgery     on her forehead  . BLEPHAROPLASTY    . CATARACT EXTRACTION    . CLEFT LIP REPAIR    . COLON SURGERY  2014?, 07/09/2018   Polyp Removal  . COLONOSCOPY    . JOINT REPLACEMENT  2015?   Right Hand, thumb & middle finger    Social History:  reports that she has never smoked. She has never been exposed to tobacco smoke. She has never used smokeless tobacco. She reports current alcohol  use. She reports that she does not use drugs.  Married.  She is a orthoptist.  Family History: family history includes Allergic rhinitis in her mother; Alzheimer's disease in her father; Aortic aneurysm in her brother; Arthritis in her mother; Asthma in her father; Breast cancer in her maternal aunt; Colon cancer in her maternal uncle; Coronary Artery Disease (Blocked arteries around heart) in her father, paternal grandfather, and paternal uncle; Dementia in her mother; Depression in her mother and paternal uncle; Developmental delay in her brother; Diverticulitis in her mother; Fatty liver disease in her sister; Heart block  in her father; Hyperlipidemia (Elevated cholesterol) in her brother, father, mother, and sister; Myocardial Infarction (Heart attack) in her paternal grandfather; No Known Problems in her brother; Osteoarthritis in her mother; Skin cancer in her mother; Stroke in her maternal grandmother, mother, and paternal grandmother; Thyroid  disease in her father.  Medications: Current Outpatient Medications  Medication Sig Dispense Refill  . acetaminophen  (TYLENOL ) 650 MG ER tablet One Tablet as needed up to 3 times a day for joint pain, 30 days, 90 tablets 90 tablet 1  . albuterol  (PROAIR  HFA) 90 mcg/actuation inhaler Inhale 2 inhalations into the lungs every 6 (six)  hours as needed for Wheezing 3 Inhaler 1  . aspirin 81 MG EC tablet Take 81 mg by mouth once daily    . azelastine  (ASTELIN ) 137 mcg nasal spray Place 1 spray into both nostrils 2 (two) times daily 10 mL 1  . buPROPion  (WELLBUTRIN  SR) 200 MG SR tablet TAKE 1 TABLET TWICE A DAY 180 tablet 3  . busPIRone  (BUSPAR ) 15 MG tablet TAKE 1 TABLET TWICE A DAY 180 tablet 3  . calcium  carbonate-vitamin D3 (CALTRATE 600+D) 600 mg(1,500mg ) -200 unit tablet Take 1 tablet by mouth once daily    . diflorasone-emollient (APEXICON E) 0.05 % Crea cream continuously as needed    . dilTIAZem  (CARDIZEM  CD) 240 MG CD capsule Take 1 capsule (240 mg total) by mouth once daily 30 capsule 11  . DULoxetine  40 mg CpDR     . estrogen, conjugated,-medroxyPROGESTERone (PREMPRO ) 0.45-1.5 mg tablet Take 1 tablet by mouth once daily 90 tablet 4  . fluocinonide  (LIDEX ) 0.05 % cream Apply topically 2 (two) times daily as needed    . fluticasone  propionate (FLONASE ) 50 mcg/actuation nasal spray     . gabapentin  (NEURONTIN ) 100 MG capsule Take 100 mg by mouth 2 (two) times daily    . levothyroxine  (SYNTHROID ) 50 MCG tablet TAKE ONE AND ONE-HALF TABLETS ON MONDAY, WEDNESDAY, AND FRIDAY AND 1 TABLET DAILY ON REST OF THE DAYS 126 tablet 4  . liothyronine  (CYTOMEL ) 5 MCG tablet Take 0.5 tablets (2.5 mcg total) by mouth once daily Take on an empty stomach with a glass of water at least 30-60 minutes before breakfast. 45 tablet 4  . meclizine  (ANTIVERT ) 32 MG tablet Take 1 tablet (32 mg total) by mouth 3 (three) times daily as needed 90 tablet 3  . melatonin 10 mg Cap Take 1 tablet by mouth nightly       . montelukast  (SINGULAIR ) 10 mg tablet TAKE 1 TABLET NIGHTLY 90 tablet 3  . multivit with minerals/lutein (MULTIVITAMIN 50 PLUS ORAL) Take by mouth once daily       . pantoprazole  (PROTONIX ) 40 MG DR tablet Take 1 tablet (40 mg total) by mouth once daily 90 tablet 3  . polyethylene glycol (MIRALAX ) packet Take 17 g by mouth once daily Mix in  4-8ounces of fluid prior to taking.    . pravastatin  (PRAVACHOL ) 40 MG tablet TAKE 2 TABLETS NIGHTLY (Patient taking differently: Take 40 mg by mouth once daily) 180 tablet 3  . sodium chloride  (SALINE MIST NASAL) by Nasal route 2 (two) times daily       . traZODone  (DESYREL ) 50 MG tablet Take 1 tablet (50 mg total) by mouth nightly 90 tablet 3  . fluticasone  propion-salmeteroL (ADVAIR DISKUS) 100-50 mcg/dose diskus inhaler Inhale 1 inhalation into the lungs every 12 (twelve) hours for 180 days 180 each 3   No current facility-administered medications for this visit.  Allergies: Allergies  Allergen Reactions  . Levaquin In 5 % Dextrose  [Levofloxacin In D5w] Other (See Comments)    Burning in vein  . Levofloxacin Other (See Comments)    IV Levaquin causes burning  . Other Itching, Other (See Comments) and Unknown    Surgical glue  Eczema  . Grass Pollen Other (See Comments) and Itching    Physical Exam: Vitals:   02/29/24 1102  BP: 126/70  Pulse: 90  SpO2: 99%  Weight: 64.4 kg (142 lb)  Height: 161.3 cm (5' 3.5)    Body mass index is 24.76 kg/m. GENERAL: Pleasant, well-appearing female in no distress.   Physical exam otherwise deferred due to coronavirus precautions.   Labs: 10/23/2018: TSH = 2.903 02/22/2019: TSH = 3.105  08/26/2019: TSH = 3.015.  K/Cr/Ca= 4.7/0.9/9.5.  Cholesterol = 204/87/79.5/107.  LFTs normal. 01/23/2020: TSH = 5.08.  Free T4 = 0.77.  Free T3 = 3.0. 02/26/2020:  TSH=2.849.  FT4=0.76.  FT3=2.7.  K/Cr/Ca=4.7/0.9/9.5.     07/29/2020: TSH = 3.735, free T4 = 0.86.  Free T3 = 2.6.   02/03/2021: TSH = 2.58, free T4 = 0.78, free T3 = 2.7.   02/04/2022:  TSH = 2.85.  FT4 = 0.75.  FT3 = 3.2.  K/Cr/ca = 4.5/0.9/9.8.   LFTs nl.   08/11/2202:  TSH = 1.417.  FT4 = 0.76. 02/06/2023:  TSH = 1.59.  FT4 = 0.81.  K/Cr/Ca = 4.0/0.91/9.2.  10/02/2023: A1c = 5.7. MA = 0.4. Chol = 194/92/88/87. FT3 = 84.  D = 39. 11/24/2023: K/Cr/Ca = 3.7/0.59/8.9. LFTs nl.  12/17/2023:  K/Cr/Ca = 3.4/1.04/9.2,eGFR = 58. 02/29/2024: K/Cr/Ca = 4.6/0.69/9.6,eGFR = 58.  Assessment/Plan: 1.  Hot flashes.  We previously discussed postmenopausal estrogen replacement therapy.  She is currently on prempro .  Every time she goes down on her dose, her hot flashes worsen.  Since she has been on it for over 20 years, I think there is very little excess CV risk to her continuing it.  She would prefer to continue it and previously said she would accept any excess risk anyway.  We did discuss she might need to come off it if the breast lump turns out to be cancer.   2.  Hypothyroidism.  Her TSH was nl in 10/24 on 75 mcg of levothyroxine  MWF and 50 mcg the remaining 4 days of the week (~60 mcg daily).  She also takes Cytomel  2.5 mcg daily.  She feels better on combination therapy.  I will leave her dose the same. I will recheck her TFTs today and make adjustments as necessary.  3.  Fatigue.  Things improved w/ combination therapy.  4. Mammogram request. She says she felt a bump/lump on her breast. She is concerned and requests a referral for a mammogram.  She has not yet been seen by her PCP as her previous PCP retired, and her first appointment with the new PCP is not until a few months from now. I sent in a referral to [facility].  I told her to continue taking Prempro  for now. However, I also told her if the breast lump turns out to be cancerous, we might need to discontinue the Prempro   5.  She will return to clinic in 12 months.    Addendum:  02/29/2024 :  TSH=1.32.  FT4=0.81.  FT3=3.2. Refills sent. Results sent via MyChart   03/10/24:  Pt had mammo and  bx on 11/6.  Results show cancer.  Has appt with Dr. Jacobo in  Onc on 11/12.  Messaged her.  This note is partially prepared by Earla Daria Messier, Scribe, in the presence of and acting as the scribe of Dr. Debby Breaker , MD.    [facility], MD

## 2024-03-04 ENCOUNTER — Non-Acute Institutional Stay: Admitting: Orthopedic Surgery

## 2024-03-04 ENCOUNTER — Other Ambulatory Visit: Payer: Self-pay | Admitting: Orthopedic Surgery

## 2024-03-04 ENCOUNTER — Other Ambulatory Visit: Payer: Self-pay | Admitting: "Endocrinology

## 2024-03-04 ENCOUNTER — Encounter: Payer: Self-pay | Admitting: Orthopedic Surgery

## 2024-03-04 VITALS — BP 138/64 | HR 88 | Temp 96.7°F | Ht 63.0 in | Wt 144.2 lb

## 2024-03-04 DIAGNOSIS — N6323 Unspecified lump in the left breast, lower outer quadrant: Secondary | ICD-10-CM

## 2024-03-04 DIAGNOSIS — Z1231 Encounter for screening mammogram for malignant neoplasm of breast: Secondary | ICD-10-CM

## 2024-03-04 NOTE — Progress Notes (Signed)
 Location:   [facility]   Place of Service:   [facility] Provider:  Greig FORBES Cluster, NP   Cluster Greig FORBES, NP  Patient Care Team: Cluster Greig FORBES, NP as PCP - General (Adult Health Nurse Practitioner) Laurice Francis NOVAK, OD [facility])  Extended Emergency Contact Information Primary Emergency Contact: Henckel,Dwight E. Address: [address]          [place], [place] [address] [place] Phone: [phone] Relation: Spouse  Code Status:  Full code Goals of care: Advanced Directive information    01/30/2024   11:08 AM  Advanced Directives  Does Patient Have a Medical Advance Directive? Yes  Type of Advance Directive Healthcare Power of Attorney  Does patient want to make changes to medical advance directive? No - Patient declined  Copy of Healthcare Power of Attorney in Chart? No - copy requested  Would patient like information on creating a medical advance directive? No - Patient declined     Chief Complaint  Patient presents with   Breast Mass    Left Breast Mass. Found about 1 week ago.     HPI:  Pt is a 73 y.o. female seen today for acute visit due to eft breast mass.   Discussed the use of AI scribe software for clinical note transcription with the patient, who gave verbal consent to proceed.  History of Present Illness   Gene Colee is a 73 year old female with a history of melanoma and Hodgkin's lymphoma who presents with a new mass in her left breast.  She noticed a mass in her left breast approximately one week ago. The mass feels 'grainy' and is tender, especially during self-examination. She has a history of very cystic breasts and has experienced tenderness in the past. No nipple discharge is reported.  Her past medical history includes melanoma, which was removed, and Hodgkin's lymphoma, which started in her chest and moved upwards. She has not been previously diagnosed with breast cancer.  In terms of family history, she is  uncertain about breast cancer in her family. Her maternal aunt, Roselind, may have had breast cancer, but her mother and grandmother did not have a known history of breast cancer. She has a sister who has not had similar health issues.       Past Medical History:  Diagnosis Date   Anxiety    Arthritis    Asthma    Cataract    Chronic constipation    Chronic insomnia    Depression    Diverticulitis    GAD (generalized anxiety disorder)    GERD (gastroesophageal reflux disease)    Hodgkin's lymphoma (HCC) 2006   underwent radiation and chemo, last CT scan since moving to [place] from [place] 2018   Hyperlipidemia    Hypertension    Hypothyroidism    Menopausal syndrome    OAB (overactive bladder)    Paresthesia    Pneumonia    PSVT (paroxysmal supraventricular tachycardia)    Seasonal allergies    Sleep apnea    SVT (supraventricular tachycardia)    Thyroid  disease    hypothyroid   Past Surgical History:  Procedure Laterality Date   BACK SURGERY     basal cell carcinoma surgery     BLEPHAROPLASTY Bilateral    BREAST BIOPSY     benign   BREAST BIOPSY  07/27/2018   us  core bx venus COLUMNAR CELL METAPLASIA, AND PSEUDOANGIOMATOUS STROMAL   CARPAL TUNNEL RELEASE Right  CARPAL TUNNEL RELEASE Left    CATARACT EXTRACTION Bilateral 2015   CLEFT LIP REPAIR     COLON SURGERY     COLONOSCOPY N/A 11/23/2023   Procedure: COLONOSCOPY;  Surgeon: Onita Elspeth Sharper, DO;  Location: [facility] ENDOSCOPY;  Service: Gastroenterology;  Laterality: N/A;  DM   COLONOSCOPY WITH PROPOFOL  N/A 07/09/2018   Procedure: COLONOSCOPY WITH PROPOFOL ;  Surgeon: Gaylyn Gladis PENNER, MD;  Location: [facility] ENDOSCOPY;  Service: Endoscopy;  Laterality: N/A;   DE QUERVAIN'S RELEASE Bilateral    DIAGNOSTIC LAPAROSCOPY     endometriosis   ESOPHAGOGASTRODUODENOSCOPY N/A 07/09/2018   Procedure: ESOPHAGOGASTRODUODENOSCOPY (EGD);  Surgeon: Gaylyn Gladis PENNER, MD;  Location: [facility] ENDOSCOPY;  Service: Endoscopy;  Laterality: N/A;    ESOPHAGOGASTRODUODENOSCOPY N/A 11/23/2023   Procedure: EGD (ESOPHAGOGASTRODUODENOSCOPY);  Surgeon: Onita Elspeth Sharper, DO;  Location: [facility] ENDOSCOPY;  Service: Gastroenterology;  Laterality: N/A;   EYE SURGERY     FINGER SURGERY Right    Middle finger joint replaced   JOINT REPLACEMENT     MAXIMUM ACCESS (MAS) TRANSFORAMINAL LUMBAR INTERBODY FUSION (TLIF) 2 LEVEL Right 07/06/2020   Procedure: L4/5, L5/S1 TRANSFORAMINAL LUMBAR INTERBODY FUSION (TLIF), L4-S1 PEDICLE SCREWS,DECOMPRESSION ;  Surgeon: Bluford Elspeth, MD;  Location: [facility] ORS;  Service: Neurosurgery;  Laterality: Right;   POLYPECTOMY  11/23/2023   Procedure: POLYPECTOMY, INTESTINE;  Surgeon: Onita Elspeth Sharper, DO;  Location: [facility] ENDOSCOPY;  Service: Gastroenterology;;   THUMB SURGERY Right    took tendon from forearm and wrapped it around the thumb joint    Allergies  Allergen Reactions   Levofloxacin     IV Levaquin causes burning   Other Itching    Surgical glue    Outpatient Encounter Medications as of 03/04/2024  Medication Sig   acetaminophen  (TYLENOL ) 650 MG CR tablet Take 1 tablet (650 mg total) by mouth at bedtime.   albuterol  (VENTOLIN  HFA) 108 (90 Base) MCG/ACT inhaler Inhale 2 puffs into the lungs every 6 (six) hours as needed for wheezing or shortness of breath.   aspirin EC 81 MG tablet Take 1 tablet (81 mg total) by mouth daily. Swallow whole.   azelastine  (ASTELIN ) 0.1 % nasal spray USE 2 SPRAYS IN EACH NOSTRIL TWICE A DAY AS DIRECTED   busPIRone  (BUSPAR ) 15 MG tablet Take 1 tablet (15 mg total) by mouth 2 (two) times daily.   Carboxymethylcellulose Sodium (THERATEARS) 0.25 % SOLN Place 1 drop into both eyes daily as needed (Dry eyes).   diclofenac  Sodium (VOLTAREN ) 1 % GEL Apply 1 application topically 2 (two) times daily.   diltiazem  (CARDIZEM  CD) 120 MG 24 hr capsule Take 2 capsules (240 mg total) by mouth daily.   DULoxetine  (CYMBALTA ) 20 MG capsule TAKE 1 CAPSULE(20 MG) BY MOUTH TWICE DAILY    fluticasone -salmeterol (WIXELA INHUB) 100-50 MCG/ACT AEPB Inhale 1 puff into the lungs 2 (two) times daily.   gabapentin  (NEURONTIN ) 100 MG capsule Take 1 capsule (100 mg total) by mouth daily as needed.   levothyroxine  (SYNTHROID ) 50 MCG tablet Take 1-1.5 tablets (50-75 mcg total) by mouth See admin instructions. Take 75 mg every  Monday, Wednesday and Friday Take 50 mg all the other days   liothyronine  (CYTOMEL ) 5 MCG tablet Take 0.5 tablets (2.5 mcg total) by mouth daily.   Melatonin 10 MG TABS Take 10 mg by mouth at bedtime. Timed-Release   montelukast  (SINGULAIR ) 10 MG tablet Take 1 tablet (10 mg total) by mouth at bedtime.   Multiple Vitamin (MULTIVITAMIN) tablet Take 1 tablet by mouth daily. Vita Fusion  adult Gummie   pantoprazole  (PROTONIX ) 40 MG tablet Take 1 tablet (40 mg total) by mouth every morning.   Polyethyl Glycol-Propyl Glycol (SYSTANE) 0.4-0.3 % GEL ophthalmic gel Place 1 application  into both eyes at bedtime.   polyethylene glycol powder (GLYCOLAX /MIRALAX ) 17 GM/SCOOP powder Take 17 g by mouth daily.   pravastatin  (PRAVACHOL ) 40 MG tablet Take 1 tablet (40 mg total) by mouth daily.   predniSONE  (STERAPRED UNI-PAK 21 TAB) 10 MG (21) TBPK tablet Take by mouth daily. Take 6 tabs by mouth daily  for 1 days, then 5 tabs for 1 days, then 4 tabs for 1 days, then 3 tabs for 1 days, 2 tabs for 1 days, then 1 tab by mouth daily for 1 days   PREMPRO  0.45-1.5 MG tablet Take 1 tablet by mouth daily.   sodium chloride  (OCEAN) 0.65 % SOLN nasal spray Place 1 spray into both nostrils 2 (two) times daily.   traZODone  (DESYREL ) 50 MG tablet Take 1 tablet (50 mg total) by mouth at bedtime.   White Petrolatum-Mineral Oil (LUBRICANT PM) OINT Apply 1 g to eye at bedtime.   linaclotide (LINZESS) 290 MCG CAPS capsule Take 290 mcg by mouth. (Patient not taking: Reported on 03/04/2024)   No facility-administered encounter medications on file as of 03/04/2024.    Review of Systems  Constitutional:  Negative.   Respiratory: Negative.    Cardiovascular: Negative.   Musculoskeletal:  Positive for gait problem.  Skin:        Left breast lump  Psychiatric/Behavioral: Negative.      Immunization History  Administered Date(s) Administered   Fluad Quad(high Dose 65+) 02/04/2022   INFLUENZA, HIGH DOSE SEASONAL PF 12/27/2018   Influenza,inj,Quad PF,6+ Mos 02/17/2017, 01/04/2018   Influenza-Unspecified 01/18/2018, 12/27/2018, 01/03/2020, 01/15/2021, 02/23/2023, 01/15/2024   Moderna Sars-Covid-2 Vaccination 05/17/2019, 06/14/2019   PFIZER SARS-COV-2 Pediatric Vaccination 5-70yrs 05/17/2019, 06/14/2019, 03/17/2020   Pfizer Covid-19 Vaccine Bivalent Booster 96yrs & up 01/21/2021   Pneumococcal Polysaccharide-23 04/30/2018   Td 05/03/2011   Tdap 08/12/2021   Unspecified SARS-COV-2 Vaccination 02/07/2022, 01/27/2023, 09/16/2023, 01/15/2024   Zoster Recombinant(Shingrix) 01/03/2020, 03/13/2020   Pertinent  Health Maintenance Due  Topic Date Due   OPHTHALMOLOGY EXAM  Never done   DEXA SCAN  Never done   HEMOGLOBIN A1C  04/02/2024   FOOT EXAM  01/29/2025   Mammogram  04/12/2025   Colonoscopy  11/22/2033   Influenza Vaccine  Completed      09/29/2023   11:00 AM 11/10/2023   10:29 AM 11/30/2023    9:51 AM 12/22/2023   10:32 AM 01/30/2024   11:07 AM  Fall Risk  Falls in the past year? 1 1 1 1 1   Was there an injury with Fall? 1 1 1 1 1   Fall Risk Category Calculator 3 3 2 3 3   Patient at Risk for Falls Due to Impaired balance/gait Impaired balance/gait;Impaired mobility Impaired mobility Impaired balance/gait;Impaired mobility Impaired balance/gait  Fall risk Follow up Falls evaluation completed Falls evaluation completed Falls evaluation completed Falls evaluation completed Falls evaluation completed   Functional Status Survey:    Vitals:   03/04/24 1357  BP: 138/64  Pulse: 88  Temp: (!) 96.7 F (35.9 C)  SpO2: 99%  Weight: 144 lb 3.2 oz (65.4 kg)  Height: 5' 3 (1.6 m)    Body mass index is 25.54 kg/m. Physical Exam Vitals reviewed.  Constitutional:      General: She is not in acute distress. HENT:     Head: Normocephalic.  Eyes:     General:        Right eye: No discharge.        Left eye: No discharge.  Cardiovascular:     Rate and Rhythm: Normal rate and regular rhythm.     Pulses: Normal pulses.     Heart sounds: Normal heart sounds.  Pulmonary:     Effort: Pulmonary effort is normal.     Breath sounds: Normal breath sounds.  Chest:  Breasts:    Left: Mass and tenderness present. No nipple discharge or skin change.     Comments: Left breast with approx 2-3 fixed, firm mass to lower outer quadrant, mild tenderness on exam Abdominal:     Palpations: Abdomen is soft.  Musculoskeletal:        General: No signs of injury.     Cervical back: Neck supple.  Skin:    General: Skin is warm.     Capillary Refill: Capillary refill takes less than 2 seconds.  Neurological:     General: No focal deficit present.     Mental Status: She is alert and oriented to person, place, and time.     Gait: Gait abnormal.  Psychiatric:        Mood and Affect: Mood normal.     Labs reviewed: Recent Labs    11/24/23 1844 12/17/23 1538 12/17/23 1802 02/01/24 0745  NA 135 139  --  137  K 3.7 3.4*  --  4.6  CL 103 103  --  101  CO2 23 25  --  28  GLUCOSE 111* 119*  --  92  BUN 13 13  --  12  CREATININE 0.59 1.04*  --  0.69  CALCIUM  8.9 9.2  --  9.6  MG  --   --  2.5*  --    Recent Labs    11/24/23 1844  AST 28  ALT 24  ALKPHOS 118  BILITOT 0.5  PROT 6.9  ALBUMIN 3.8   Recent Labs    11/24/23 1844 12/17/23 1538  WBC 9.7 5.8  HGB 12.4 11.8*  HCT 37.4 36.4  MCV 92.6 93.6  PLT 381 320   Lab Results  Component Value Date   TSH 1.59 02/06/2023   Lab Results  Component Value Date   HGBA1C 5.7 (H) 10/02/2023   Lab Results  Component Value Date   CHOL 194 10/02/2023   HDL 88 10/02/2023   LDLCALC 87 10/02/2023   TRIG 92  10/02/2023   CHOLHDL 2.2 10/02/2023    Significant Diagnostic Results in last 30 days:  No results found.  Assessment/Plan: 1. Unspecified lump in the left breast, lower outer quadrant (Primary) - onset 1 month ago - mildly tender, fixed mass palpated to lower outer quadrant - no nipple discharge or skin discoloration - plan to have diagnostic mammogram at [facility] - MM 3D DIAGNOSTIC MAMMOGRAM BILATERAL BREAST; Future   Family/ staff Communication: plan discussed with patient   Labs/tests ordered:  schedule diagnostic mammogram

## 2024-03-04 NOTE — Patient Instructions (Addendum)
 [facility]> [phone]  [address], [phone]

## 2024-03-06 ENCOUNTER — Ambulatory Visit
Admission: RE | Admit: 2024-03-06 | Discharge: 2024-03-06 | Disposition: A | Discharge status: Home / Self Care | Source: Ambulatory Visit | Attending: Orthopedic Surgery | Admitting: Orthopedic Surgery

## 2024-03-06 ENCOUNTER — Other Ambulatory Visit: Payer: Self-pay | Admitting: Orthopedic Surgery

## 2024-03-06 DIAGNOSIS — N6323 Unspecified lump in the left breast, lower outer quadrant: Secondary | ICD-10-CM

## 2024-03-07 ENCOUNTER — Ambulatory Visit: Payer: Self-pay | Admitting: Orthopedic Surgery

## 2024-03-07 ENCOUNTER — Ambulatory Visit
Admission: RE | Admit: 2024-03-07 | Discharge: 2024-03-07 | Disposition: A | Discharge status: Home / Self Care | Source: Ambulatory Visit | Attending: Orthopedic Surgery | Admitting: Orthopedic Surgery

## 2024-03-07 DIAGNOSIS — C50812 Malignant neoplasm of overlapping sites of left female breast: Secondary | ICD-10-CM | POA: Insufficient documentation

## 2024-03-07 DIAGNOSIS — N6323 Unspecified lump in the left breast, lower outer quadrant: Secondary | ICD-10-CM

## 2024-03-07 HISTORY — PX: COMPLETE MASTECTOMY W/ SENTINEL NODE BIOPSY: SHX1383

## 2024-03-07 HISTORY — PX: BREAST BIOPSY: SHX20

## 2024-03-07 MED ORDER — LIDOCAINE 1 % OPTIME INJ - NO CHARGE
5.0000 mL | Freq: Once | INTRAMUSCULAR | Status: AC
  Administered 2024-03-07: 5 mL via INTRADERMAL
  Filled 2024-03-07: qty 6

## 2024-03-07 MED ORDER — LIDOCAINE-EPINEPHRINE 1 %-1:100000 IJ SOLN
8.0000 mL | Freq: Once | INTRAMUSCULAR | Status: AC
  Administered 2024-03-07: 8 mL via INTRADERMAL
  Filled 2024-03-07: qty 8

## 2024-03-08 ENCOUNTER — Other Ambulatory Visit: Payer: Self-pay

## 2024-03-08 DIAGNOSIS — C50919 Malignant neoplasm of unspecified site of unspecified female breast: Secondary | ICD-10-CM

## 2024-03-08 LAB — SURGICAL PATHOLOGY

## 2024-03-11 ENCOUNTER — Encounter: Payer: Self-pay | Admitting: *Deleted

## 2024-03-11 NOTE — Progress Notes (Signed)
 Received referral for newly diagnosed breast cancer from [facility].  Navigation initiated.  She will see Dr. Jacobo on 11/12.  She would like to discuss surgeons with Dr. Jacobo on Wednesday.  She is thinking about possibly going to [facility] for a breast insurance claims handler.

## 2024-03-12 ENCOUNTER — Ambulatory Visit: Payer: Self-pay | Admitting: Orthopedic Surgery

## 2024-03-13 ENCOUNTER — Encounter: Payer: Self-pay | Admitting: *Deleted

## 2024-03-13 ENCOUNTER — Inpatient Hospital Stay: Attending: Oncology | Admitting: Oncology

## 2024-03-13 ENCOUNTER — Inpatient Hospital Stay

## 2024-03-13 VITALS — BP 124/62 | HR 102 | Wt 141.0 lb

## 2024-03-13 DIAGNOSIS — C50912 Malignant neoplasm of unspecified site of left female breast: Secondary | ICD-10-CM

## 2024-03-13 DIAGNOSIS — G4700 Insomnia, unspecified: Secondary | ICD-10-CM | POA: Insufficient documentation

## 2024-03-13 DIAGNOSIS — K219 Gastro-esophageal reflux disease without esophagitis: Secondary | ICD-10-CM | POA: Insufficient documentation

## 2024-03-13 DIAGNOSIS — M129 Arthropathy, unspecified: Secondary | ICD-10-CM | POA: Insufficient documentation

## 2024-03-13 DIAGNOSIS — Z79890 Hormone replacement therapy (postmenopausal): Secondary | ICD-10-CM | POA: Insufficient documentation

## 2024-03-13 DIAGNOSIS — C50412 Malignant neoplasm of upper-outer quadrant of left female breast: Secondary | ICD-10-CM | POA: Insufficient documentation

## 2024-03-13 DIAGNOSIS — Z791 Long term (current) use of non-steroidal anti-inflammatories (NSAID): Secondary | ICD-10-CM | POA: Insufficient documentation

## 2024-03-13 DIAGNOSIS — Z9221 Personal history of antineoplastic chemotherapy: Secondary | ICD-10-CM | POA: Insufficient documentation

## 2024-03-13 DIAGNOSIS — Z923 Personal history of irradiation: Secondary | ICD-10-CM | POA: Insufficient documentation

## 2024-03-13 DIAGNOSIS — Z1732 Human epidermal growth factor receptor 2 negative status: Secondary | ICD-10-CM | POA: Insufficient documentation

## 2024-03-13 DIAGNOSIS — Z8572 Personal history of non-Hodgkin lymphomas: Secondary | ICD-10-CM | POA: Insufficient documentation

## 2024-03-13 DIAGNOSIS — Z1721 Progesterone receptor positive status: Secondary | ICD-10-CM | POA: Insufficient documentation

## 2024-03-13 DIAGNOSIS — Z7951 Long term (current) use of inhaled steroids: Secondary | ICD-10-CM | POA: Insufficient documentation

## 2024-03-13 DIAGNOSIS — E785 Hyperlipidemia, unspecified: Secondary | ICD-10-CM | POA: Insufficient documentation

## 2024-03-13 DIAGNOSIS — Z170 Estrogen receptor positive status [ER+]: Secondary | ICD-10-CM | POA: Insufficient documentation

## 2024-03-13 DIAGNOSIS — E039 Hypothyroidism, unspecified: Secondary | ICD-10-CM | POA: Insufficient documentation

## 2024-03-13 DIAGNOSIS — Z803 Family history of malignant neoplasm of breast: Secondary | ICD-10-CM | POA: Insufficient documentation

## 2024-03-13 DIAGNOSIS — I10 Essential (primary) hypertension: Secondary | ICD-10-CM | POA: Insufficient documentation

## 2024-03-13 DIAGNOSIS — Z79899 Other long term (current) drug therapy: Secondary | ICD-10-CM | POA: Insufficient documentation

## 2024-03-13 DIAGNOSIS — G4730 Sleep apnea, unspecified: Secondary | ICD-10-CM | POA: Insufficient documentation

## 2024-03-13 NOTE — Progress Notes (Unsigned)
 Patient didn't want me to keep asking her anymore questions due to her feeling so overwhelmed and more anxious, so I was only able to get her vitals and her newest medication list.

## 2024-03-13 NOTE — Progress Notes (Signed)
 Ms. Fromer will see Dr. Tye 11/17 at 10:00 am.

## 2024-03-13 NOTE — Progress Notes (Unsigned)
 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  INTERVAL HISTORY: Patient is a 73 year old female who recently palpated an abnormality in her left breast.  Subsequent mammogram, ultrasound, biopsy revealed the above-stated malignancy.  She otherwise feels well.  She has no neurologic complaints.  She denies any recent fevers or illnesses.  She has a good appetite and denies weight loss.  She has no chest pain, shortness of breath, cough, or hemoptysis.  She denies any nausea, vomiting, constipation, or diarrhea.  She has no urinary complaints.  Patient offers no further specific complaints today.  REVIEW OF SYSTEMS:   Review of Systems  Constitutional: Negative.  Negative for fever, malaise/fatigue and weight loss.  Respiratory: Negative.  Negative for cough, hemoptysis and shortness of breath.   Cardiovascular: Negative.  Negative for chest pain and leg swelling.  Gastrointestinal: Negative.  Negative for abdominal pain.  Genitourinary: Negative.  Negative for dysuria.  Musculoskeletal: Negative.  Negative for back pain.  Skin: Negative.  Negative for rash.  Neurological: Negative.  Negative for dizziness, focal weakness, weakness and headaches.  Psychiatric/Behavioral: Negative.  The patient is not nervous/anxious.     As per HPI. Otherwise, a complete review of systems is negative.  PAST MEDICAL HISTORY: Past Medical History:  Diagnosis Date   Anxiety    Arthritis    Asthma    Cataract    Chronic constipation    Chronic insomnia    Depression    Diverticulitis    GAD (generalized anxiety disorder)    GERD  (gastroesophageal reflux disease)    Hodgkin's lymphoma (HCC) 2006   underwent radiation and chemo, last CT scan since moving to [place] from [place] 2018   Hyperlipidemia    Hypertension    Hypothyroidism    Menopausal syndrome    OAB (overactive bladder)    Paresthesia    Pneumonia    PSVT (paroxysmal supraventricular tachycardia)    Seasonal allergies    Sleep apnea    SVT (supraventricular tachycardia)    Thyroid  disease    hypothyroid    PAST SURGICAL HISTORY: Past Surgical History:  Procedure Laterality Date   BACK SURGERY     basal cell carcinoma surgery     BLEPHAROPLASTY Bilateral    BREAST BIOPSY     benign   BREAST BIOPSY  07/27/2018   us  core bx venus COLUMNAR CELL METAPLASIA, AND PSEUDOANGIOMATOUS STROMAL   BREAST BIOPSY Left 03/07/2024   US  LT BREAST BX W LOC DEV 1ST LESION IMG BX SPEC US  GUIDE 03/07/2024 [facility]   CARPAL TUNNEL RELEASE Right    CARPAL TUNNEL RELEASE Left    CATARACT EXTRACTION Bilateral 2015   CLEFT LIP REPAIR     COLON SURGERY     COLONOSCOPY N/A 11/23/2023   Procedure: COLONOSCOPY;  Surgeon: Onita Elspeth Sharper, DO;  Location: [facility] ENDOSCOPY;  Service: Gastroenterology;  Laterality: N/A;  DM   COLONOSCOPY WITH PROPOFOL  N/A 07/09/2018   Procedure: COLONOSCOPY WITH PROPOFOL ;  Surgeon: Gaylyn Gladis PENNER, MD;  Location: [facility] ENDOSCOPY;  Service: Endoscopy;  Laterality: N/A;   COMPLETE MASTECTOMY W/ SENTINEL NODE BIOPSY Left 03/07/2024   u/s bx coil clip path pending  DE QUERVAIN'S RELEASE Bilateral    DIAGNOSTIC LAPAROSCOPY     endometriosis   ESOPHAGOGASTRODUODENOSCOPY N/A 07/09/2018   Procedure: ESOPHAGOGASTRODUODENOSCOPY (EGD);  Surgeon: Gaylyn Gladis PENNER, MD;  Location: [facility] ENDOSCOPY;  Service: Endoscopy;  Laterality: N/A;   ESOPHAGOGASTRODUODENOSCOPY N/A 11/23/2023   Procedure: EGD (ESOPHAGOGASTRODUODENOSCOPY);  Surgeon: Onita Elspeth Sharper, DO;  Location: [facility] ENDOSCOPY;  Service: Gastroenterology;  Laterality: N/A;   EYE  SURGERY     FINGER SURGERY Right    Middle finger joint replaced   JOINT REPLACEMENT     MAXIMUM ACCESS (MAS) TRANSFORAMINAL LUMBAR INTERBODY FUSION (TLIF) 2 LEVEL Right 07/06/2020   Procedure: L4/5, L5/S1 TRANSFORAMINAL LUMBAR INTERBODY FUSION (TLIF), L4-S1 PEDICLE SCREWS,DECOMPRESSION ;  Surgeon: Bluford Elspeth, MD;  Location: [facility] ORS;  Service: Neurosurgery;  Laterality: Right;   POLYPECTOMY  11/23/2023   Procedure: POLYPECTOMY, INTESTINE;  Surgeon: Onita Elspeth Sharper, DO;  Location: [facility] ENDOSCOPY;  Service: Gastroenterology;;   THUMB SURGERY Right    took tendon from forearm and wrapped it around the thumb joint    FAMILY HISTORY: Family History  Problem Relation Age of Onset   Breast cancer Maternal Aunt    Anuerysm Mother    Stroke Mother    Heart attack Father    Alzheimer's disease Father    Developmental delay Brother     ADVANCED DIRECTIVES (Y/N):  N  HEALTH MAINTENANCE: Social History   Tobacco Use   Smoking status: Never    Passive exposure: Past   Smokeless tobacco: Never  Vaping Use   Vaping status: Never Used  Substance Use Topics   Alcohol  use: Yes    Comment: very occasional once a week   Drug use: No     Colonoscopy:  PAP:  Bone density:  Lipid panel:  Allergies  Allergen Reactions   Levofloxacin     IV Levaquin causes burning   Other Itching    Surgical glue    Current Outpatient Medications  Medication Sig Dispense Refill   acetaminophen  (TYLENOL ) 650 MG CR tablet Take 1 tablet (650 mg total) by mouth at bedtime. 90 tablet 3   albuterol  (VENTOLIN  HFA) 108 (90 Base) MCG/ACT inhaler Inhale 2 puffs into the lungs every 6 (six) hours as needed for wheezing or shortness of breath. 8 g 2   aspirin EC 81 MG tablet Take 1 tablet (81 mg total) by mouth daily. Swallow whole. 90 tablet 1   azelastine  (ASTELIN ) 0.1 % nasal spray USE 2 SPRAYS IN EACH NOSTRIL TWICE A DAY AS DIRECTED 90 mL 3   busPIRone  (BUSPAR ) 15 MG tablet Take 1 tablet (15 mg  total) by mouth 2 (two) times daily. 180 tablet 3   fluticasone -salmeterol (WIXELA INHUB) 100-50 MCG/ACT AEPB Inhale 1 puff into the lungs 2 (two) times daily. 180 each 3   gabapentin  (NEURONTIN ) 100 MG capsule Take 1 capsule (100 mg total) by mouth daily as needed.     levothyroxine  (SYNTHROID ) 50 MCG tablet Take 1-1.5 tablets (50-75 mcg total) by mouth See admin instructions. Take 75 mg every  Monday, Wednesday and Friday Take 50 mg all the other days 90 tablet 3   linaclotide (LINZESS) 290 MCG CAPS capsule Take 290 mcg by mouth.     liothyronine  (CYTOMEL ) 5 MCG tablet Take 0.5 tablets (2.5 mcg total) by mouth daily. 45 tablet 3   Melatonin 10 MG TABS Take 10 mg by mouth at bedtime. Timed-Release 90 tablet 3   montelukast  (SINGULAIR ) 10 MG tablet Take 1 tablet (10 mg total) by mouth  at bedtime. 90 tablet 3   Multiple Vitamin (MULTIVITAMIN) tablet Take 1 tablet by mouth daily. Vita Fusion adult Gummie 90 tablet 3   pantoprazole  (PROTONIX ) 40 MG tablet Take 1 tablet (40 mg total) by mouth every morning. 30 tablet 0   Polyethyl Glycol-Propyl Glycol (SYSTANE) 0.4-0.3 % GEL ophthalmic gel Place 1 application  into both eyes at bedtime.     polyethylene glycol powder (GLYCOLAX /MIRALAX ) 17 GM/SCOOP powder Take 17 g by mouth daily. 255 g 3   pravastatin  (PRAVACHOL ) 40 MG tablet Take 1 tablet (40 mg total) by mouth daily. 180 tablet 3   traZODone  (DESYREL ) 50 MG tablet Take 1 tablet (50 mg total) by mouth at bedtime. 90 tablet 3   White Petrolatum-Mineral Oil (LUBRICANT PM) OINT Apply 1 g to eye at bedtime.     diclofenac  Sodium (VOLTAREN ) 1 % GEL Apply 1 application topically 2 (two) times daily. (Patient not taking: Reported on 03/13/2024)     No current facility-administered medications for this visit.    OBJECTIVE: Vitals:   03/13/24 1127  BP: 124/62  Pulse: (!) 102  SpO2: 99%     Body mass index is 24.98 kg/m.    ECOG FS:0 - Asymptomatic  General: Well-developed, well-nourished, no acute  distress. Eyes: Pink conjunctiva, anicteric sclera. HEENT: Normocephalic, moist mucous membranes. Lungs: No audible wheezing or coughing. Heart: Regular rate and rhythm. Abdomen: Soft, nontender, no obvious distention. Musculoskeletal: No edema, cyanosis, or clubbing. Neuro: Alert, answering all questions appropriately. Cranial nerves grossly intact. Skin: No rashes or petechiae noted. Psych: Normal affect. Lymphatics: No cervical, calvicular, axillary or inguinal LAD.   LAB RESULTS:  Lab Results  Component Value Date   NA 137 02/01/2024   K 4.6 02/01/2024   CL 101 02/01/2024   CO2 28 02/01/2024   GLUCOSE 92 02/01/2024   BUN 12 02/01/2024   CREATININE 0.69 02/01/2024   CALCIUM  9.6 02/01/2024   PROT 6.9 11/24/2023   ALBUMIN 3.8 11/24/2023   AST 28 11/24/2023   ALT 24 11/24/2023   ALKPHOS 118 11/24/2023   BILITOT 0.5 11/24/2023   GFRNONAA 58 (L) 12/17/2023   GFRAA >60 06/23/2018    Lab Results  Component Value Date   WBC 5.8 12/17/2023   NEUTROABS 4.4 03/06/2017   HGB 11.8 (L) 12/17/2023   HCT 36.4 12/17/2023   MCV 93.6 12/17/2023   PLT 320 12/17/2023     STUDIES: US  LT BREAST BX W LOC DEV 1ST LESION IMG BX SPEC US  GUIDE Addendum Date: 03/11/2024 ADDENDUM REPORT: 03/11/2024 07:48 ADDENDUM: PATHOLOGY revealed: Breast, left, needle core biopsy, 3:00 5 cmfn 10 mm ( coil clip)- INVASIVE MAMMARY CARCINOMA, AN E-CADHERIN STAIN IS PENDING AND WILL BE REPORTED IN AN ADDENDUM - OVERALL GRADE: II/III - LYMPHOVASCULAR INVASION: NOT IDENTIFIED - CANCER LENGTH: 7 MM IN GREATEST LINEAR DIMENSION ON HEAVILY FRAGMENTED CORES - CALCIFICATIONS: NOT IDENTIFIED Pathology results are CONCORDANT with imaging findings, per Dr. Norleen Croak. Pathology results and recommendations were discussed with patient via telephone on 03/08/2024 by Rock Hover RN. Patient reported biopsy site doing well with no adverse symptoms, and slight tenderness at the site. Post biopsy care instructions were reviewed,  questions were answered and my direct phone number was provided. Patient was instructed to call [facility] for any additional questions or concerns related to biopsy site. RECOMMENDATIONS: 1. Surgical and oncological consultation. Request for surgical and oncological consultation relayed to Shasta Ada RN at [facility] by Rock Hover RN on 03/08/2024. Pathology results  reported by Rock Hover RN on 03/08/2024. Electronically Signed   By: Norleen Croak M.D.   On: 03/11/2024 07:48   Result Date: 03/11/2024 CLINICAL DATA:  Highly suspicious LEFT breast mass EXAM: ULTRASOUND GUIDED LEFT BREAST CORE NEEDLE BIOPSY COMPARISON:  Previous exam(s). PROCEDURE: I met with the patient and we discussed the procedure of ultrasound-guided biopsy, including benefits and alternatives. We discussed the high likelihood of a successful procedure. We discussed the risks of the procedure, including infection, bleeding, tissue injury, clip migration, and inadequate sampling. Informed written consent was given. The usual time-out protocol was performed immediately prior to the procedure. Lesion quadrant: Upper-outer Using sterile technique and 1% lidocaine  and 1% lidocaine  with epinephrine  as local anesthetic, under direct ultrasound visualization, a 14 gauge spring-loaded device was used to perform biopsy of the LEFT breast mass at 3 o'clock 5 cm from the nipple using a inferior approach. At the conclusion of the procedure coil shaped tissue marker clip was deployed into the biopsy cavity. Follow up 2 view mammogram was performed and dictated separately. IMPRESSION: Ultrasound guided biopsy of the LEFT breast mass at 3 o'clock 5 cm from the nipple. No apparent complications. Electronically Signed: By: Norleen Croak M.D. On: 03/07/2024 13:59   MM CLIP PLACEMENT LEFT Result Date: 03/07/2024 CLINICAL DATA:  Status post LEFT breast biopsy EXAM: 3D DIAGNOSTIC LEFT MAMMOGRAM POST ULTRASOUND BIOPSY COMPARISON:   Previous exam(s). ACR Breast Density Category c: The breasts are heterogeneously dense, which may obscure small masses. FINDINGS: 3D Mammographic images were obtained following ultrasound guided biopsy of the LEFT breast mass at 3 o'clock 5 cm from the nipple. The biopsy marking clip is in expected position at the site of biopsy. IMPRESSION: Appropriate positioning of the coil shaped biopsy marking clip at the site of biopsy in the LEFT breast. Final Assessment: Post Procedure Mammograms for Marker Placement Electronically Signed   By: Norleen Croak M.D.   On: 03/07/2024 14:11   MM 3D DIAGNOSTIC MAMMOGRAM BILATERAL BREAST Result Date: 03/06/2024 CLINICAL DATA:  LEFT breast lump x1 week.  Due for annual. EXAM: DIGITAL DIAGNOSTIC BILATERAL MAMMOGRAM WITH TOMOSYNTHESIS AND CAD; ULTRASOUND LEFT BREAST LIMITED TECHNIQUE: Bilateral digital diagnostic mammography and breast tomosynthesis was performed. The images were evaluated with computer-aided detection. ; Targeted ultrasound examination of the left breast was performed. COMPARISON:  Previous exam(s). ACR Breast Density Category c: The breasts are heterogeneously dense, which may obscure small masses. FINDINGS: No mammographic evidence of malignancy in the RIGHT breast. Spot compression tomosynthesis views were obtained over the palpable area of concern in the LEFT breast. No suspicious mammographic finding is identified in this area. Superior to the area of concern indicated by the patient, however, there is focal architectural distortion an asymmetry at the approximate 3 o'clock position. An additional obscured mass, likely unchanged from prior examinations, is also noted in the superficial upper-outer quadrant of the LEFT breast, just superior to the architectural distortion. On physical examination, no discrete palpable abnormality is noted in the area of concern indicated by the patient. Targeted ultrasound of this area (4 o'clock 8 cm from the nipple)  demonstrates no suspicious abnormalities. However, just superior to the area of concern indicated by the patient is a 10 x 7 x 8 mm irregular, hypoechoic mass with spiculated margins, located at the 3 o'clock position 5 cm from the nipple. This correlates with the architectural distortion seen on mammography. More superiorly at the 2:30 position 7 cm from the nipple there is a 5 mm benign simple  cyst in the superficial breast which correlates with the obscured mass seen on mammography. LEFT axillary ultrasound was performed, without visualized lymphadenopathy. IMPRESSION: 1. Highly suspicious LEFT breast mass measuring 1 cm at the 3 o'clock position, correlating with architectural distortion on today's mammogram. Ultrasound-guided biopsy is recommended. 2.  No LEFT axillary lymphadenopathy seen on targeted ultrasound. 3. No mammographic evidence of malignancy in the RIGHT breast. RECOMMENDATION: Ultrasound-guided biopsy of the LEFT breast x1 I have discussed the findings and recommendations with the patient. The biopsy procedure was discussed with the patient and questions were answered. Patient expressed their understanding of the biopsy recommendation. Patient will be scheduled for biopsy at her earliest convenience by the schedulers. Ordering provider will be notified. If applicable, a reminder letter will be sent to the patient regarding the next appointment. BI-RADS CATEGORY  5: Highly suggestive of malignancy. Electronically Signed   By: Norleen Croak M.D.   On: 03/06/2024 15:13   US  LIMITED ULTRASOUND INCLUDING AXILLA LEFT BREAST  Result Date: 03/06/2024 CLINICAL DATA:  LEFT breast lump x1 week.  Due for annual. EXAM: DIGITAL DIAGNOSTIC BILATERAL MAMMOGRAM WITH TOMOSYNTHESIS AND CAD; ULTRASOUND LEFT BREAST LIMITED TECHNIQUE: Bilateral digital diagnostic mammography and breast tomosynthesis was performed. The images were evaluated with computer-aided detection. ; Targeted ultrasound examination of the left  breast was performed. COMPARISON:  Previous exam(s). ACR Breast Density Category c: The breasts are heterogeneously dense, which may obscure small masses. FINDINGS: No mammographic evidence of malignancy in the RIGHT breast. Spot compression tomosynthesis views were obtained over the palpable area of concern in the LEFT breast. No suspicious mammographic finding is identified in this area. Superior to the area of concern indicated by the patient, however, there is focal architectural distortion an asymmetry at the approximate 3 o'clock position. An additional obscured mass, likely unchanged from prior examinations, is also noted in the superficial upper-outer quadrant of the LEFT breast, just superior to the architectural distortion. On physical examination, no discrete palpable abnormality is noted in the area of concern indicated by the patient. Targeted ultrasound of this area (4 o'clock 8 cm from the nipple) demonstrates no suspicious abnormalities. However, just superior to the area of concern indicated by the patient is a 10 x 7 x 8 mm irregular, hypoechoic mass with spiculated margins, located at the 3 o'clock position 5 cm from the nipple. This correlates with the architectural distortion seen on mammography. More superiorly at the 2:30 position 7 cm from the nipple there is a 5 mm benign simple cyst in the superficial breast which correlates with the obscured mass seen on mammography. LEFT axillary ultrasound was performed, without visualized lymphadenopathy. IMPRESSION: 1. Highly suspicious LEFT breast mass measuring 1 cm at the 3 o'clock position, correlating with architectural distortion on today's mammogram. Ultrasound-guided biopsy is recommended. 2.  No LEFT axillary lymphadenopathy seen on targeted ultrasound. 3. No mammographic evidence of malignancy in the RIGHT breast. RECOMMENDATION: Ultrasound-guided biopsy of the LEFT breast x1 I have discussed the findings and recommendations with the patient.  The biopsy procedure was discussed with the patient and questions were answered. Patient expressed their understanding of the biopsy recommendation. Patient will be scheduled for biopsy at her earliest convenience by the schedulers. Ordering provider will be notified. If applicable, a reminder letter will be sent to the patient regarding the next appointment. BI-RADS CATEGORY  5: Highly suggestive of malignancy. Electronically Signed   By: Norleen Croak M.D.   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  It is clear if she will require chemotherapy and will send Oncotype Dx score surgical specimen for further evaluation.  If patient pursues lumpectomy, she will also require adjuvant XRT.  Finally, given the ER/PR status of her malignancy she will benefit from letrozole for a total of 5 years at the completion of all her treatments.  Patient will return to clinic approximately 2 weeks after her surgery to discuss her final pathology results, consultation with radiation oncology, and treatment planning.  I spent a total of 60 minutes reviewing chart data, face-to-face evaluation with the patient, counseling and coordination of care as detailed above.   Patient expressed understanding and was in agreement with this plan. She also understands that She can call clinic at any time with any questions, concerns, or complaints.    Cancer Staging  Invasive ductal carcinoma of left breast [facility]) Staging form: Breast, AJCC 8th Edition - Clinical stage from 03/15/2024: Stage IA (cT1b, cN0, cM0, G2, ER+, PR+, HER2-) - Signed by Jacobo Evalene PARAS, MD on 03/15/2024 Stage prefix: Initial diagnosis Histologic grading system: 3 grade  system   Evalene PARAS Jacobo, MD   03/15/2024 9:00 AM

## 2024-03-13 NOTE — Progress Notes (Signed)
 Accompanied patient and family to initial medical oncology appointment.   Reviewed Breast Cancer treatment handbook.   Care plan summary given to patient.   Reviewed outreach programs and cancer center services.

## 2024-03-15 DIAGNOSIS — C50912 Malignant neoplasm of unspecified site of left female breast: Secondary | ICD-10-CM | POA: Insufficient documentation

## 2024-03-18 ENCOUNTER — Encounter: Payer: Self-pay | Admitting: *Deleted

## 2024-03-18 ENCOUNTER — Other Ambulatory Visit: Payer: Self-pay | Admitting: Surgery

## 2024-03-18 ENCOUNTER — Ambulatory Visit: Payer: Self-pay | Admitting: Surgery

## 2024-03-18 DIAGNOSIS — C50512 Malignant neoplasm of lower-outer quadrant of left female breast: Secondary | ICD-10-CM

## 2024-03-18 DIAGNOSIS — C50912 Malignant neoplasm of unspecified site of left female breast: Secondary | ICD-10-CM

## 2024-03-18 NOTE — Progress Notes (Signed)
 Lumpectomy is scheduled for 11/25.  She will see OT, Dr. Jacobo and Dr. Lenn on 12/17.

## 2024-03-18 NOTE — H&P (Signed)
 Subjective:   CC: Malignant neoplasm of lower-outer quadrant of left breast of female, estrogen receptor positive (CMS/HHS-HCC) [C50.512, Z17.0] HPI:  referred by Evalene Norleen Reusing, * for evaluation of above. Change was noted on last screening mammogram.   History of Present Illness    Past Medical History:  has a past medical history of Allergic state, Anxiety, Arrhythmia (2016), Arthritis (1995), Asthma without status asthmaticus (HHS-HCC), Colon polyp, Depression, Diverticulitis, Essential hypertension (03/24/2021), GERD (gastroesophageal reflux disease), History of abnormal cervical Pap smear (Years ago), History of cataract (Surgery 2015), History of Hodgkin's lymphoma (03/01/2017), Hodgkin's lymphoma (CMS/HHS-HCC) (2005), Hyperlipidemia, OSA on CPAP (03/22/2023), and Thyroid  disease (2006).  Past Surgical History:  has a past surgical history that includes Cleft lip repair; Cataract extraction; Blepharoplasty; Colonoscopy; Colonoscopy (07/09/2018); egd (07/09/2018); Colon surgery (2014?, 07/09/2018); Joint replacement (2015?); basal cell carcinoma surgery; L4-5 TRANSFORAMINAL LUMBAR INTERBODY FUSION AND L4-S1 PEDICLE SCREW FIXATION (07/06/2020); COLON@[facility]  (11/23/2023); and EGD@[facility]  (11/23/2023).  Family History: family history includes Allergic rhinitis in her mother; Alzheimer's disease in her father; Aortic aneurysm in her brother; Arthritis in her mother; Asthma in her father; Breast cancer in her maternal aunt; Colon cancer in her maternal uncle; Coronary Artery Disease (Blocked arteries around heart) in her father, paternal grandfather, and paternal uncle; Dementia in her mother; Depression in her mother and paternal uncle; Developmental delay in her brother; Diverticulitis in her mother; Fatty liver disease in her sister; Heart block in her father; Hyperlipidemia (Elevated cholesterol) in her brother, father, mother, and sister; Myocardial Infarction (Heart attack) in her paternal  grandfather; No Known Problems in her brother; Osteoarthritis in her mother; Skin cancer in her mother; Stroke in her maternal grandmother, mother, and paternal grandmother; Thyroid  disease in her father.  Social History:  reports that she has never smoked. She has never been exposed to tobacco smoke. She has never used smokeless tobacco. She reports current alcohol  use. She reports that she does not use drugs.  Current Medications: has a current medication list which includes the following prescription(s): acetaminophen , albuterol  mdi (proventil , ventolin , proair ) hfa, aspirin, azelastine , bupropion , buspirone , calcium  carbonate-vitamin d3, diflorasone-emollient, diltiazem , duloxetine , prempro , fluocinonide , fluticasone  propion-salmeterol, fluticasone  propionate, gabapentin , levothyroxine , linaclotide, liothyronine , meclizine , melatonin, montelukast , multivit with minerals/lutein, pantoprazole , polyethylene glycol, pravastatin , sodium chloride , and trazodone .  Allergies:  Allergies as of 03/18/2024 - Reviewed 03/18/2024  Allergen Reaction Noted   Levaquin in 5 % dextrose  [levofloxacin in d5w] Other (See Comments) 11/28/2016   Levofloxacin Other (See Comments) 01/30/2017   Other Itching, Other (See Comments), and Unknown    Grass pollen Other (See Comments) and Itching 12/17/2021    ROS:  A 15 point review of systems was performed and was negative except as noted in HPI   Objective:     BP (!) 152/81   Pulse 107   Ht 161.3 cm (5' 3.5)   Wt 64.4 kg (142 lb)   BMI 24.76 kg/m   Constitutional :  No distress, cooperative, alert  Lymphatics/Throat:  Supple with no lymphadenopathy  Respiratory:  Clear to auscultation bilaterally  Cardiovascular:  Regular rate and rhythm  Gastrointestinal: Soft, non-tender, non-distended, no organomegaly.  Musculoskeletal: Steady gait and movement  Skin: Cool and moist  Psychiatric: Normal affect, non-agitated, not confused  Breast: Normal appearance  and no palpable abnormality in bilateral breasts and axilla.  Chaperone present for exam.      LABS:  SURGICAL PATHOLOGY SURGICAL PATHOLOGY [facility] [address] [place], [place] [address] Telephone [phone] or [phone]  Fax [phone]  REPORT OF SURGICAL PATHOLOGY   Accession #: [id number] Patient Name: Andrea Noble, Andrea Noble Visit # : [id number]  MRN: [id number] Physician: Anice PONCE Rush DOB/Age [birth year] (Age: 73) Gender: F Collected Date: 03/07/2024 Received Date: 03/07/2024  FINAL DIAGNOSIS       1. Breast, left, needle core biopsy, 3:00 5cmfn 10mm ( coil clip) :      - INVASIVE MAMMARY CARCINOMA, AN E-CADHERIN STAIN IS PENDING AND WILL BE      REPORTED IN AN ADDENDUM.      - TUBULE FORMATION: SCORE 3/3      - NUCLEAR PLEOMORPHISM: SCORE 2/3      - MITOTIC COUNT: SCORE 1/3      - TOTAL SCORE: 6/9      - OVERALL GRADE: II/III      - LYMPHOVASCULAR INVASION: NOT IDENTIFIED      - CANCER LENGTH: 7 MM IN GREATEST LINEAR DIMENSION ON HEAVILY FRAGMENTED CORES      - CALCIFICATIONS: NOT IDENTIFIED      - OTHER FINDINGS: N/A      NOTE:      DR. [facility] REVIEWED THE CASE AND CONCURS WITH THE INTERPRETATION.  A BREAST      PROGNOSTIC PROFILE (ER, PR, KI-67 AND HER2) IS PENDING AND WILL BE REPORTED IN      AN ADDENDUM.  LINDA NASH WAS NOTIFIED ON 03/08/2024.       DATE SIGNED OUT: 03/08/2024 ELECTRONIC SIGNATURE : Legolvan Do, Mark, Pathologist, Electronic Signature  MICROSCOPIC DESCRIPTION  CASE COMMENTS STAINS USED IN DIAGNOSIS: H&E-2 H&E-3 H&E-4 H&E Universal Negative Control-DAB *RECUT 1 SLIDE Stains used in diagnosis 1 E-CAD, 1 Her2 by IHC, 1 ER-ACIS, 1 KI-67-ACIS, 1 PR-ACIS Some of these immunohistochemical stains may have been developed and the performance characteristics determined by [facility].  Some may not have been cleared or approved by the U.S. Food and Drug Administration.  The FDA has  determined that such clearance or approval is not necessary.  This test is used for clinical purposes.  It should not be regarded as investigational or for research.  This laboratory is certified under the Clinical Laboratory Improvement Amendments of 1988 (CLIA-88) as qualified to perform high complexity clinical laboratory testing. IHC scores are reported using ASCO/CAP scoring criteria.  An IHC Score of 0 or 1+  is NEGATIVE for HER2, 3+ is POSITIVE for HER2, and 2+ is EQUIVOCAL. Equivocal results are reflexed to either FISH or IHC testing. Specimens are fixed in 10% Neutral Buffered Formalin for at least 6 hours and up to 72 hours. These tests have not be validated on decalcified tissue.  Results should be interpreted with caution given the possibility of false negative results on decalcified specimens. Antibody Clone for HER2 is 4B5 (PATHWAY). Some of these immunohistochemical stains may have been developed and the performance characteristics determined by [facility].  Some may not have been cleared or approved by the U.S. Food and Drug Administration.  The FDA has determined that such clearance or approval is not necessary.  This test is used for clinical purposes.  It should not be regarded as investigational or for research.  This laboratory is certified under the Clinical Laboratory Improvement Amendments of 1988 (CLIA-88) as qualified to perform high complexity clinical laboratory testing. Estrogen receptor (6F11), immunohistochemical stains are performed on formalin fixed, paraffin embedded tissue using a 3,3-diaminobenzidine (DAB) chromogen and Leica Bond Autostainer System.  The staining intensity of the nucleus is scored manually and is reported  as the percentage of tumor cell nuclei demonstrating specific nuclear staining.Specimens are fixed in 10% Neutral Buffered Formalin for at least 6 hours and up to 72 hours.  These tests have not be validated on  decalcified tissue.  Results should be interpreted with caution given the possibility of false negative results on decalcified specimens. Ki-67 (MM1), immunohistochemical stains are performed on formalin fixed, paraffin embedded tissue using a 3,3-diaminobenzidine (DAB) chromogen and Leica Bond Autostainer System.  The staining intensity of the nucleus is scored manually and is reported as the percentage of tumor cell nuclei demonstrating specific nuclear staining.Specimens are fixed in 10% Neutral Buffered Formalin for at least 6 hours and up to 72 hours. These tests have not be validated on decalcified tissue.  Results should be interpreted with caution given the possibility of false negative results on decalcified specimens. PR progesterone receptor (16), immunohistochemical stains are performed on formalin fixed, paraffin embedded tissue using a 3,3-diaminobenzidine (DAB) chromogen and Leica Bond Autostainer System.  The staining intensity of the nucleus is scored manually and is reported as the percentage of tumor cell nuclei demonstrating specific nuclear staining.Specimens are fixed in 10% Neutral Buffered Formalin for at least 6 hours and up to 72 hours. These tests have not be validated on decalcified tissue.  Results should be interpreted with caution given the possibility of false negative results on decalcified specimens.  ADDENDUM Breast, left, needle core biopsy PROGNOSTIC INDICATORS Results: IMMUNOHISTOCHEMICAL AND MORPHOMETRIC ANALYSIS PERFORMED MANUALLY The tumor cells are negative for Her2 (1+). Estrogen Receptor:  90%, positive, strong staining intensity Progesterone Receptor:  90%, positive, strong staining intensity Proliferation Marker Ki67: 20% COMMENT:  The negative hormone receptor study(ies) in this case has an internal positive control.  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 Legolvan Do, Mark, Pathologist, Electronic Signature ( Signed 11 10 2025)   CLINICAL HISTORY  SPECIMEN(S) OBTAINED 1. Breast, left, needle core biopsy, 3:00 5cmfn 10mm ( Coil Clip)  SPECIMEN COMMENTS: 1. TIF: 13:40pm, CIT: < 3 minutes SPECIMEN CLINICAL INFORMATION: 1. Br5 mass left breast    Gross Description 1. Received in formalin labeled Peterson,Yatzil and left breast 3 o'clock, 5 cmfn (TIF 1340, CIT less than 3 mins) are several cores and irregular pieces of gray white to yellow red soft to firm tissue, 1.2 x 0.8 x 0.15 cm in aggregate. One block.  (SW:gt, 03/08/24)        Report signed out from the following location(s) [place].  [facility] [address] [place], [place] [address] CLIA #: [id number]  [facility] [address] [place], [place] [address] CLIA #: [id number]     RADS: CLINICAL DATA:  LEFT breast lump x1 week.  Due for annual.   EXAM:  DIGITAL DIAGNOSTIC BILATERAL MAMMOGRAM WITH TOMOSYNTHESIS AND CAD;  ULTRASOUND LEFT BREAST LIMITED   TECHNIQUE:  Bilateral digital diagnostic mammography and breast tomosynthesis  was performed. The images were evaluated with computer-aided  detection. ; Targeted ultrasound examination of the left breast was  performed.   COMPARISON: Previous exam(s).   ACR Breast Density Category c: The breasts are heterogeneously  dense, which may obscure small masses.  FINDINGS:  No mammographic evidence of malignancy in the RIGHT breast.   Spot compression tomosynthesis views were obtained over the palpable  area of concern in the LEFT breast. No suspicious mammographic  finding is identified in this area. Superior to the area of concern  indicated by the  patient, however, there is focal architectural  distortion an asymmetry at the approximate 3 o'clock position. An  additional obscured mass, likely unchanged from prior examinations,  is also noted in the superficial upper-outer quadrant of the LEFT  breast, just superior to the architectural distortion.   On physical examination, no discrete palpable abnormality is noted  in the area of concern indicated by the patient. Targeted ultrasound  of this area (4 o'clock 8 cm from the nipple) demonstrates no  suspicious abnormalities. However, just superior to the area of  concern indicated by the patient is a 10 x 7 x 8 mm irregular,  hypoechoic mass with spiculated margins, located at the 3 o'clock  position 5 cm from the nipple. This correlates with the  architectural distortion seen on mammography. More superiorly at the  2:30 position 7 cm from the nipple there is a 5 mm benign simple  cyst in the superficial breast which correlates with the obscured  mass seen on mammography.   LEFT axillary ultrasound was performed, without visualized  lymphadenopathy.   IMPRESSION:  1. Highly suspicious LEFT breast mass measuring 1 cm at the 3  o'clock position, correlating with architectural distortion on  today's mammogram. Ultrasound-guided biopsy is recommended.   2.  No LEFT axillary lymphadenopathy seen on targeted ultrasound.   3. No mammographic evidence of malignancy in the RIGHT breast.   RECOMMENDATION:  Ultrasound-guided biopsy of the LEFT breast x1   I have discussed the findings and recommendations with the patient.  The biopsy procedure was discussed with the patient and questions  were answered. Patient expressed their understanding of the biopsy  recommendation. Patient will be scheduled for biopsy at her earliest  convenience by the schedulers. Ordering provider will be notified.  If applicable, a reminder letter will be sent to the patient  regarding the next appointment.    BI-RADS CATEGORY  5: Highly suggestive of malignancy.    Electronically Signed    By: Norleen Croak M.D.    On: 03/06/2024 15:13   Addendum by Croak Norleen JONELLE PONCE, MD on 03/11/2024  9:48 AM EST  ADDENDUM REPORT: 03/11/2024 07:48   ADDENDUM:  PATHOLOGY revealed: Breast, left, needle core biopsy, 3:00 5 cmfn 10  mm ( coil clip)- INVASIVE MAMMARY CARCINOMA, AN E-CADHERIN STAIN IS  PENDING AND WILL BE REPORTED IN AN ADDENDUM - OVERALL GRADE: II/III  - LYMPHOVASCULAR INVASION: NOT IDENTIFIED - CANCER LENGTH: 7 MM IN  GREATEST LINEAR DIMENSION ON HEAVILY FRAGMENTED CORES -  CALCIFICATIONS: NOT IDENTIFIED   Pathology results are CONCORDANT with imaging findings, per Dr. Norleen Croak.   Pathology results and recommendations were discussed with patient  via telephone on 03/08/2024 by Rock Hover RN. Patient reported biopsy  site doing well with no adverse symptoms, and slight tenderness at  the site. Post biopsy care instructions were reviewed, questions  were answered and my direct phone number was provided. Patient was  instructed to call [facility] for any additional  questions or concerns related to biopsy site.   RECOMMENDATIONS: 1. Surgical and oncological consultation. Request  for surgical and oncological consultation relayed to Shasta Ada RN at  [facility] by Rock  Hollie RN on 03/08/2024.   Pathology results reported by Rock Hollie RN on 03/08/2024.    Electronically Signed    By: Norleen Croak M.D.    On: 03/11/2024 07:48   Assessment:   Malignant neoplasm of lower-outer quadrant of left breast of female, estrogen receptor positive (CMS/HHS-HCC) [C50.512, Z17.0]  Plan:     1. Malignant neoplasm of lower-outer quadrant of left breast of female, estrogen receptor positive (CMS/HHS-HCC) [C50.512, Z17.0]  Discussed the risk of surgery including recurrence, chronic pain, post-op infxn, poor/delayed wound healing, poor cosmesis, seroma, hematoma  formation, and possible re-operation to address said risks. The risks of general anesthetic, if used, includes MI, CVA, sudden death or even reaction to anesthetic medications also discussed.  Typical post-op recovery time and possbility of activity restrictions were also discussed.  Alternatives include continued observation.  Benefits include possible symptom relief, pathologic evaluation, and/or curative excision.   The patient verbalized understanding and all questions were answered to the patient's satisfaction.  2. Patient has elected to proceed with surgical treatment. Procedure will be scheduled.  Lumpectomy, SLNB, SCOUT tag prior.  labs/images/medications/previous chart entries reviewed personally and relevant changes/updates noted above.

## 2024-03-18 NOTE — Progress Notes (Signed)
 Error

## 2024-03-18 NOTE — H&P (View-Only) (Signed)
 Subjective:   CC: Malignant neoplasm of lower-outer quadrant of left breast of female, estrogen receptor positive (CMS/HHS-HCC) [C50.512, Z17.0] HPI:  referred by Evalene Norleen Reusing, * for evaluation of above. Change was noted on last screening mammogram.   History of Present Illness    Past Medical History:  has a past medical history of Allergic state, Anxiety, Arrhythmia (2016), Arthritis (1995), Asthma without status asthmaticus (HHS-HCC), Colon polyp, Depression, Diverticulitis, Essential hypertension (03/24/2021), GERD (gastroesophageal reflux disease), History of abnormal cervical Pap smear (Years ago), History of cataract (Surgery 2015), History of Hodgkin's lymphoma (03/01/2017), Hodgkin's lymphoma (CMS/HHS-HCC) (2005), Hyperlipidemia, OSA on CPAP (03/22/2023), and Thyroid  disease (2006).  Past Surgical History:  has a past surgical history that includes Cleft lip repair; Cataract extraction; Blepharoplasty; Colonoscopy; Colonoscopy (07/09/2018); egd (07/09/2018); Colon surgery (2014?, 07/09/2018); Joint replacement (2015?); basal cell carcinoma surgery; L4-5 TRANSFORAMINAL LUMBAR INTERBODY FUSION AND L4-S1 PEDICLE SCREW FIXATION (07/06/2020); COLON@[facility]  (11/23/2023); and EGD@[facility]  (11/23/2023).  Family History: family history includes Allergic rhinitis in her mother; Alzheimer's disease in her father; Aortic aneurysm in her brother; Arthritis in her mother; Asthma in her father; Breast cancer in her maternal aunt; Colon cancer in her maternal uncle; Coronary Artery Disease (Blocked arteries around heart) in her father, paternal grandfather, and paternal uncle; Dementia in her mother; Depression in her mother and paternal uncle; Developmental delay in her brother; Diverticulitis in her mother; Fatty liver disease in her sister; Heart block in her father; Hyperlipidemia (Elevated cholesterol) in her brother, father, mother, and sister; Myocardial Infarction (Heart attack) in her paternal  grandfather; No Known Problems in her brother; Osteoarthritis in her mother; Skin cancer in her mother; Stroke in her maternal grandmother, mother, and paternal grandmother; Thyroid  disease in her father.  Social History:  reports that she has never smoked. She has never been exposed to tobacco smoke. She has never used smokeless tobacco. She reports current alcohol  use. She reports that she does not use drugs.  Current Medications: has a current medication list which includes the following prescription(s): acetaminophen , albuterol  mdi (proventil , ventolin , proair ) hfa, aspirin, azelastine , bupropion , buspirone , calcium  carbonate-vitamin d3, diflorasone-emollient, diltiazem , duloxetine , prempro , fluocinonide , fluticasone  propion-salmeterol, fluticasone  propionate, gabapentin , levothyroxine , linaclotide, liothyronine , meclizine , melatonin, montelukast , multivit with minerals/lutein, pantoprazole , polyethylene glycol, pravastatin , sodium chloride , and trazodone .  Allergies:  Allergies as of 03/18/2024 - Reviewed 03/18/2024  Allergen Reaction Noted   Levaquin in 5 % dextrose  [levofloxacin in d5w] Other (See Comments) 11/28/2016   Levofloxacin Other (See Comments) 01/30/2017   Other Itching, Other (See Comments), and Unknown    Grass pollen Other (See Comments) and Itching 12/17/2021    ROS:  A 15 point review of systems was performed and was negative except as noted in HPI   Objective:     BP (!) 152/81   Pulse 107   Ht 161.3 cm (5' 3.5)   Wt 64.4 kg (142 lb)   BMI 24.76 kg/m   Constitutional :  No distress, cooperative, alert  Lymphatics/Throat:  Supple with no lymphadenopathy  Respiratory:  Clear to auscultation bilaterally  Cardiovascular:  Regular rate and rhythm  Gastrointestinal: Soft, non-tender, non-distended, no organomegaly.  Musculoskeletal: Steady gait and movement  Skin: Cool and moist  Psychiatric: Normal affect, non-agitated, not confused  Breast: Normal appearance  and no palpable abnormality in bilateral breasts and axilla.  Chaperone present for exam.      LABS:  SURGICAL PATHOLOGY SURGICAL PATHOLOGY [facility] [address] [place], [place] [address] Telephone [phone] or [phone]  Fax [phone]  REPORT OF SURGICAL PATHOLOGY   Accession #: [id number] Patient Name: Andrea Noble, Andrea Noble Visit # : [id number]  MRN: [id number] Physician: Anice PONCE Rush DOB/Age [birth year] (Age: 73) Gender: F Collected Date: 03/07/2024 Received Date: 03/07/2024  FINAL DIAGNOSIS       1. Breast, left, needle core biopsy, 3:00 5cmfn 10mm ( coil clip) :      - INVASIVE MAMMARY CARCINOMA, AN E-CADHERIN STAIN IS PENDING AND WILL BE      REPORTED IN AN ADDENDUM.      - TUBULE FORMATION: SCORE 3/3      - NUCLEAR PLEOMORPHISM: SCORE 2/3      - MITOTIC COUNT: SCORE 1/3      - TOTAL SCORE: 6/9      - OVERALL GRADE: II/III      - LYMPHOVASCULAR INVASION: NOT IDENTIFIED      - CANCER LENGTH: 7 MM IN GREATEST LINEAR DIMENSION ON HEAVILY FRAGMENTED CORES      - CALCIFICATIONS: NOT IDENTIFIED      - OTHER FINDINGS: N/A      NOTE:      DR. [facility] REVIEWED THE CASE AND CONCURS WITH THE INTERPRETATION.  A BREAST      PROGNOSTIC PROFILE (ER, PR, KI-67 AND HER2) IS PENDING AND WILL BE REPORTED IN      AN ADDENDUM.  LINDA NASH WAS NOTIFIED ON 03/08/2024.       DATE SIGNED OUT: 03/08/2024 ELECTRONIC SIGNATURE : Legolvan Do, Mark, Pathologist, Electronic Signature  MICROSCOPIC DESCRIPTION  CASE COMMENTS STAINS USED IN DIAGNOSIS: H&E-2 H&E-3 H&E-4 H&E Universal Negative Control-DAB *RECUT 1 SLIDE Stains used in diagnosis 1 E-CAD, 1 Her2 by IHC, 1 ER-ACIS, 1 KI-67-ACIS, 1 PR-ACIS Some of these immunohistochemical stains may have been developed and the performance characteristics determined by [facility].  Some may not have been cleared or approved by the U.S. Food and Drug Administration.  The FDA has  determined that such clearance or approval is not necessary.  This test is used for clinical purposes.  It should not be regarded as investigational or for research.  This laboratory is certified under the Clinical Laboratory Improvement Amendments of 1988 (CLIA-88) as qualified to perform high complexity clinical laboratory testing. IHC scores are reported using ASCO/CAP scoring criteria.  An IHC Score of 0 or 1+  is NEGATIVE for HER2, 3+ is POSITIVE for HER2, and 2+ is EQUIVOCAL. Equivocal results are reflexed to either FISH or IHC testing. Specimens are fixed in 10% Neutral Buffered Formalin for at least 6 hours and up to 72 hours. These tests have not be validated on decalcified tissue.  Results should be interpreted with caution given the possibility of false negative results on decalcified specimens. Antibody Clone for HER2 is 4B5 (PATHWAY). Some of these immunohistochemical stains may have been developed and the performance characteristics determined by [facility].  Some may not have been cleared or approved by the U.S. Food and Drug Administration.  The FDA has determined that such clearance or approval is not necessary.  This test is used for clinical purposes.  It should not be regarded as investigational or for research.  This laboratory is certified under the Clinical Laboratory Improvement Amendments of 1988 (CLIA-88) as qualified to perform high complexity clinical laboratory testing. Estrogen receptor (6F11), immunohistochemical stains are performed on formalin fixed, paraffin embedded tissue using a 3,3-diaminobenzidine (DAB) chromogen and Leica Bond Autostainer System.  The staining intensity of the nucleus is scored manually and is reported  as the percentage of tumor cell nuclei demonstrating specific nuclear staining.Specimens are fixed in 10% Neutral Buffered Formalin for at least 6 hours and up to 72 hours.  These tests have not be validated on  decalcified tissue.  Results should be interpreted with caution given the possibility of false negative results on decalcified specimens. Ki-67 (MM1), immunohistochemical stains are performed on formalin fixed, paraffin embedded tissue using a 3,3-diaminobenzidine (DAB) chromogen and Leica Bond Autostainer System.  The staining intensity of the nucleus is scored manually and is reported as the percentage of tumor cell nuclei demonstrating specific nuclear staining.Specimens are fixed in 10% Neutral Buffered Formalin for at least 6 hours and up to 72 hours. These tests have not be validated on decalcified tissue.  Results should be interpreted with caution given the possibility of false negative results on decalcified specimens. PR progesterone receptor (16), immunohistochemical stains are performed on formalin fixed, paraffin embedded tissue using a 3,3-diaminobenzidine (DAB) chromogen and Leica Bond Autostainer System.  The staining intensity of the nucleus is scored manually and is reported as the percentage of tumor cell nuclei demonstrating specific nuclear staining.Specimens are fixed in 10% Neutral Buffered Formalin for at least 6 hours and up to 72 hours. These tests have not be validated on decalcified tissue.  Results should be interpreted with caution given the possibility of false negative results on decalcified specimens.  ADDENDUM Breast, left, needle core biopsy PROGNOSTIC INDICATORS Results: IMMUNOHISTOCHEMICAL AND MORPHOMETRIC ANALYSIS PERFORMED MANUALLY The tumor cells are negative for Her2 (1+). Estrogen Receptor:  90%, positive, strong staining intensity Progesterone Receptor:  90%, positive, strong staining intensity Proliferation Marker Ki67: 20% COMMENT:  The negative hormone receptor study(ies) in this case has an internal positive control.  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 Legolvan Do, Mark, Pathologist, Electronic Signature ( Signed 11 10 2025)   CLINICAL HISTORY  SPECIMEN(S) OBTAINED 1. Breast, left, needle core biopsy, 3:00 5cmfn 10mm ( Coil Clip)  SPECIMEN COMMENTS: 1. TIF: 13:40pm, CIT: < 3 minutes SPECIMEN CLINICAL INFORMATION: 1. Br5 mass left breast    Gross Description 1. Received in formalin labeled Peterson,Yatzil and left breast 3 o'clock, 5 cmfn (TIF 1340, CIT less than 3 mins) are several cores and irregular pieces of gray white to yellow red soft to firm tissue, 1.2 x 0.8 x 0.15 cm in aggregate. One block.  (SW:gt, 03/08/24)        Report signed out from the following location(s) [place].  [facility] [address] [place], [place] [address] CLIA #: [id number]  [facility] [address] [place], [place] [address] CLIA #: [id number]     RADS: CLINICAL DATA:  LEFT breast lump x1 week.  Due for annual.   EXAM:  DIGITAL DIAGNOSTIC BILATERAL MAMMOGRAM WITH TOMOSYNTHESIS AND CAD;  ULTRASOUND LEFT BREAST LIMITED   TECHNIQUE:  Bilateral digital diagnostic mammography and breast tomosynthesis  was performed. The images were evaluated with computer-aided  detection. ; Targeted ultrasound examination of the left breast was  performed.   COMPARISON: Previous exam(s).   ACR Breast Density Category c: The breasts are heterogeneously  dense, which may obscure small masses.  FINDINGS:  No mammographic evidence of malignancy in the RIGHT breast.   Spot compression tomosynthesis views were obtained over the palpable  area of concern in the LEFT breast. No suspicious mammographic  finding is identified in this area. Superior to the area of concern  indicated by the  patient, however, there is focal architectural  distortion an asymmetry at the approximate 3 o'clock position. An  additional obscured mass, likely unchanged from prior examinations,  is also noted in the superficial upper-outer quadrant of the LEFT  breast, just superior to the architectural distortion.   On physical examination, no discrete palpable abnormality is noted  in the area of concern indicated by the patient. Targeted ultrasound  of this area (4 o'clock 8 cm from the nipple) demonstrates no  suspicious abnormalities. However, just superior to the area of  concern indicated by the patient is a 10 x 7 x 8 mm irregular,  hypoechoic mass with spiculated margins, located at the 3 o'clock  position 5 cm from the nipple. This correlates with the  architectural distortion seen on mammography. More superiorly at the  2:30 position 7 cm from the nipple there is a 5 mm benign simple  cyst in the superficial breast which correlates with the obscured  mass seen on mammography.   LEFT axillary ultrasound was performed, without visualized  lymphadenopathy.   IMPRESSION:  1. Highly suspicious LEFT breast mass measuring 1 cm at the 3  o'clock position, correlating with architectural distortion on  today's mammogram. Ultrasound-guided biopsy is recommended.   2.  No LEFT axillary lymphadenopathy seen on targeted ultrasound.   3. No mammographic evidence of malignancy in the RIGHT breast.   RECOMMENDATION:  Ultrasound-guided biopsy of the LEFT breast x1   I have discussed the findings and recommendations with the patient.  The biopsy procedure was discussed with the patient and questions  were answered. Patient expressed their understanding of the biopsy  recommendation. Patient will be scheduled for biopsy at her earliest  convenience by the schedulers. Ordering provider will be notified.  If applicable, a reminder letter will be sent to the patient  regarding the next appointment.    BI-RADS CATEGORY  5: Highly suggestive of malignancy.    Electronically Signed    By: Norleen Croak M.D.    On: 03/06/2024 15:13   Addendum by Croak Norleen JONELLE PONCE, MD on 03/11/2024  9:48 AM EST  ADDENDUM REPORT: 03/11/2024 07:48   ADDENDUM:  PATHOLOGY revealed: Breast, left, needle core biopsy, 3:00 5 cmfn 10  mm ( coil clip)- INVASIVE MAMMARY CARCINOMA, AN E-CADHERIN STAIN IS  PENDING AND WILL BE REPORTED IN AN ADDENDUM - OVERALL GRADE: II/III  - LYMPHOVASCULAR INVASION: NOT IDENTIFIED - CANCER LENGTH: 7 MM IN  GREATEST LINEAR DIMENSION ON HEAVILY FRAGMENTED CORES -  CALCIFICATIONS: NOT IDENTIFIED   Pathology results are CONCORDANT with imaging findings, per Dr. Norleen Croak.   Pathology results and recommendations were discussed with patient  via telephone on 03/08/2024 by Rock Hover RN. Patient reported biopsy  site doing well with no adverse symptoms, and slight tenderness at  the site. Post biopsy care instructions were reviewed, questions  were answered and my direct phone number was provided. Patient was  instructed to call [facility] for any additional  questions or concerns related to biopsy site.   RECOMMENDATIONS: 1. Surgical and oncological consultation. Request  for surgical and oncological consultation relayed to Shasta Ada RN at  [facility] by Rock  Hollie RN on 03/08/2024.   Pathology results reported by Rock Hollie RN on 03/08/2024.    Electronically Signed    By: Norleen Croak M.D.    On: 03/11/2024 07:48   Assessment:   Malignant neoplasm of lower-outer quadrant of left breast of female, estrogen receptor positive (CMS/HHS-HCC) [C50.512, Z17.0]  Plan:     1. Malignant neoplasm of lower-outer quadrant of left breast of female, estrogen receptor positive (CMS/HHS-HCC) [C50.512, Z17.0]  Discussed the risk of surgery including recurrence, chronic pain, post-op infxn, poor/delayed wound healing, poor cosmesis, seroma, hematoma  formation, and possible re-operation to address said risks. The risks of general anesthetic, if used, includes MI, CVA, sudden death or even reaction to anesthetic medications also discussed.  Typical post-op recovery time and possbility of activity restrictions were also discussed.  Alternatives include continued observation.  Benefits include possible symptom relief, pathologic evaluation, and/or curative excision.   The patient verbalized understanding and all questions were answered to the patient's satisfaction.  2. Patient has elected to proceed with surgical treatment. Procedure will be scheduled.  Lumpectomy, SLNB, SCOUT tag prior.  labs/images/medications/previous chart entries reviewed personally and relevant changes/updates noted above.

## 2024-03-20 ENCOUNTER — Ambulatory Visit
Admission: RE | Admit: 2024-03-20 | Discharge: 2024-03-20 | Disposition: A | Discharge status: Home / Self Care | Source: Ambulatory Visit | Attending: Surgery | Admitting: Surgery

## 2024-03-20 ENCOUNTER — Non-Acute Institutional Stay: Payer: Self-pay | Admitting: Internal Medicine

## 2024-03-20 ENCOUNTER — Inpatient Hospital Stay: Admission: RE | Admit: 2024-03-20 | Source: Ambulatory Visit

## 2024-03-20 ENCOUNTER — Inpatient Hospital Stay: Admission: RE | Admit: 2024-03-20 | Discharge: 2024-03-20 | Attending: Surgery

## 2024-03-20 VITALS — BP 138/76 | HR 94 | Temp 97.9°F | Ht 63.0 in | Wt 143.4 lb

## 2024-03-20 DIAGNOSIS — E782 Mixed hyperlipidemia: Secondary | ICD-10-CM

## 2024-03-20 DIAGNOSIS — G3184 Mild cognitive impairment, so stated: Secondary | ICD-10-CM | POA: Insufficient documentation

## 2024-03-20 DIAGNOSIS — G4733 Obstructive sleep apnea (adult) (pediatric): Secondary | ICD-10-CM

## 2024-03-20 DIAGNOSIS — C50912 Malignant neoplasm of unspecified site of left female breast: Secondary | ICD-10-CM | POA: Diagnosis not present

## 2024-03-20 DIAGNOSIS — K581 Irritable bowel syndrome with constipation: Secondary | ICD-10-CM | POA: Insufficient documentation

## 2024-03-20 DIAGNOSIS — F5104 Psychophysiologic insomnia: Secondary | ICD-10-CM

## 2024-03-20 DIAGNOSIS — Z170 Estrogen receptor positive status [ER+]: Secondary | ICD-10-CM

## 2024-03-20 DIAGNOSIS — K219 Gastro-esophageal reflux disease without esophagitis: Secondary | ICD-10-CM | POA: Diagnosis not present

## 2024-03-20 DIAGNOSIS — F331 Major depressive disorder, recurrent, moderate: Secondary | ICD-10-CM

## 2024-03-20 DIAGNOSIS — Z8571 Personal history of Hodgkin lymphoma: Secondary | ICD-10-CM | POA: Diagnosis not present

## 2024-03-20 DIAGNOSIS — C50512 Malignant neoplasm of lower-outer quadrant of left female breast: Secondary | ICD-10-CM | POA: Diagnosis present

## 2024-03-20 DIAGNOSIS — F411 Generalized anxiety disorder: Secondary | ICD-10-CM

## 2024-03-20 DIAGNOSIS — I10 Essential (primary) hypertension: Secondary | ICD-10-CM

## 2024-03-20 DIAGNOSIS — F325 Major depressive disorder, single episode, in full remission: Secondary | ICD-10-CM

## 2024-03-20 DIAGNOSIS — J4540 Moderate persistent asthma, uncomplicated: Secondary | ICD-10-CM

## 2024-03-20 DIAGNOSIS — E039 Hypothyroidism, unspecified: Secondary | ICD-10-CM

## 2024-03-20 HISTORY — PX: BREAST BIOPSY: SHX20

## 2024-03-20 MED ORDER — TRAZODONE HCL 50 MG PO TABS
50.0000 mg | ORAL_TABLET | Freq: Every day | ORAL | 3 refills | Status: DC
Start: 2024-03-20 — End: 2025-03-15

## 2024-03-20 MED ORDER — LIDOCAINE HCL 1 % IJ SOLN
5.0000 mL | Freq: Once | INTRAMUSCULAR | Status: AC
  Administered 2024-03-20: 5 mL
  Filled 2024-03-20: qty 5

## 2024-03-20 NOTE — Assessment & Plan Note (Addendum)
 Blood pressure well-controlled on Cardizem  CD to 40 mg daily.

## 2024-03-20 NOTE — Assessment & Plan Note (Addendum)
 Present, with some memory issues. Social worker Delon Finger will conduct a MOGA assessment. - Will conduct MOGA assessment with social worker Delon Finger.

## 2024-03-20 NOTE — Progress Notes (Signed)
 [facility] Outpatient Progress Note      Careteam: Patient Care Team: Gil Greig BRAVO, NP as PCP - General (Adult Health Nurse Practitioner) Laurice Francis NOVAK, OD (Optometry) Georgina Shasta POUR, RN as Oncology Nurse Navigator PLACE OF SERVICE: [facility]   Advanced Directive information     Allergies  Allergen Reactions   Levofloxacin     IV Levaquin causes burning   Other Itching    Surgical glue     Chief Complaint  Patient presents with   Medical Management of Chronic Issues    Medical Management of Chronic Issues. 2 Week Follow up     HPI: Patient is a 73 y.o. female seen in today in [facility] outpatient clinic.  Discussed the use of AI scribe software for clinical note transcription with the patient, who gave verbal consent to proceed.  History of Present Illness   Andrea Noble is a 73 year old female with a history of melanoma and Hodgkin's lymphoma who presents for pre-operative evaluation for left breast cancer.  Breast neoplasm - Diagnosed with left breast cancer by biopsy in early November 2025 - Clinical stage 1A, ER/PR positive, HER2 negative - Oncology evaluation on March 13, 2024 - Scheduled for lumpectomy and lymph node dissection on March 26, 2024 - Feels overwhelmed and anxious about upcoming surgery  History of malignancy and treatment effects - History of melanoma and Hodgkin's lymphoma - Received prior radiation and chemotherapy for Hodgkin's lymphoma - Remission for approximately 10-20 years - Thin skin and easy skin tearing, attributed to prior radiation treatments  Gastrointestinal symptoms - Irritable bowel syndrome with constipation, managed with Linzess (causes occasional diarrhea) - Uses MiraLAX  and occasionally Citrucel for symptom management - History of diverticulitis without recent episodes  Neuropsychiatric symptoms - Mild cognitive impairment - Anxiety, managed with duloxetine  40 mg daily - Sleep disturbance, uses  trazodone  50 mg (insufficient for sleep)  Musculoskeletal and neuropathic symptoms - Fibromyalgia pain, uses Neurontin  as needed - Occasional foot cramps  Dermatologic symptoms - Dry skin and skin that tears easily  Cardiovascular and metabolic history - Takes Cardiazem 240 mg for heart-related issues - Takes pravastatin  40 mg for hyperlipidemia - No diabetes  Otolaryngologic symptoms - Sinus issues, possibly related to mold exposure       Review of Systems: Review of Systems  Constitutional: Negative.   HENT: Negative.    Eyes: Negative.   Respiratory: Negative.    Cardiovascular: Negative.   Gastrointestinal: Negative.   Genitourinary: Negative.   Musculoskeletal: Negative.   Skin:        Thin and dry skin. Tears easily  Neurological: Negative.   Endo/Heme/Allergies: Negative.   Psychiatric/Behavioral:         Anxious about her lumpectomy for left breast cancer.  All other systems reviewed and are negative.    Past Medical History:  Diagnosis Date   Anxiety    Arthritis    Asthma    Cataract    Chronic constipation    Chronic insomnia    Depression    Diverticulitis    GAD (generalized anxiety disorder)    GERD (gastroesophageal reflux disease)    Hodgkin's lymphoma (HCC) 2006   underwent radiation and chemo, last CT scan since moving to [place] from [place] 2018   Hyperlipidemia    Hypertension    Hypothyroidism    Menopausal syndrome    OAB (overactive bladder)    Paresthesia    Pneumonia    PSVT (paroxysmal supraventricular tachycardia)  Seasonal allergies    Sleep apnea    SVT (supraventricular tachycardia)    Thyroid  disease    hypothyroid   Past Surgical History:  Procedure Laterality Date   BACK SURGERY     basal cell carcinoma surgery     BLEPHAROPLASTY Bilateral    BREAST BIOPSY     benign   BREAST BIOPSY  07/27/2018   us  core bx venus COLUMNAR CELL METAPLASIA, AND PSEUDOANGIOMATOUS STROMAL   BREAST BIOPSY Left 03/07/2024   US  LT BREAST BX  W LOC DEV 1ST LESION IMG BX SPEC US  GUIDE 03/07/2024 [facility]   BREAST BIOPSY Left 03/20/2024   US  LT BREAST SAVI/RF TAG 1ST LESION US  GUIDE 03/20/2024 [facility]   CARPAL TUNNEL RELEASE Right    CARPAL TUNNEL RELEASE Left    CATARACT EXTRACTION Bilateral 2015   CLEFT LIP REPAIR     COLON SURGERY     COLONOSCOPY N/A 11/23/2023   Procedure: COLONOSCOPY;  Surgeon: Onita Elspeth Sharper, DO;  Location: [facility] ENDOSCOPY;  Service: Gastroenterology;  Laterality: N/A;  DM   COLONOSCOPY WITH PROPOFOL  N/A 07/09/2018   Procedure: COLONOSCOPY WITH PROPOFOL ;  Surgeon: Gaylyn Gladis PENNER, MD;  Location: [facility] ENDOSCOPY;  Service: Endoscopy;  Laterality: N/A;   COMPLETE MASTECTOMY W/ SENTINEL NODE BIOPSY Left 03/07/2024   u/s bx coil clip path pending   DE QUERVAIN'S RELEASE Bilateral    DIAGNOSTIC LAPAROSCOPY     endometriosis   ESOPHAGOGASTRODUODENOSCOPY N/A 07/09/2018   Procedure: ESOPHAGOGASTRODUODENOSCOPY (EGD);  Surgeon: Gaylyn Gladis PENNER, MD;  Location: [facility] ENDOSCOPY;  Service: Endoscopy;  Laterality: N/A;   ESOPHAGOGASTRODUODENOSCOPY N/A 11/23/2023   Procedure: EGD (ESOPHAGOGASTRODUODENOSCOPY);  Surgeon: Onita Elspeth Sharper, DO;  Location: [facility] ENDOSCOPY;  Service: Gastroenterology;  Laterality: N/A;   EYE SURGERY     Noble SURGERY Right    Middle Noble joint replaced   JOINT REPLACEMENT     MAXIMUM ACCESS (MAS) TRANSFORAMINAL LUMBAR INTERBODY FUSION (TLIF) 2 LEVEL Right 07/06/2020   Procedure: L4/5, L5/S1 TRANSFORAMINAL LUMBAR INTERBODY FUSION (TLIF), L4-S1 PEDICLE SCREWS,DECOMPRESSION ;  Surgeon: Bluford Elspeth, MD;  Location: [facility] ORS;  Service: Neurosurgery;  Laterality: Right;   POLYPECTOMY  11/23/2023   Procedure: POLYPECTOMY, INTESTINE;  Surgeon: Onita Elspeth Sharper, DO;  Location: [facility] ENDOSCOPY;  Service: Gastroenterology;;   THUMB SURGERY Right    took tendon from forearm and wrapped it around the thumb joint   Social History:  reports that she has never smoked.  She has been exposed to tobacco smoke. She has never used smokeless tobacco. She reports current alcohol  use. She reports that she does not use drugs.   Family History  Problem Relation Age of Onset   Breast cancer Maternal Aunt    Anuerysm Mother    Stroke Mother    Heart attack Father    Alzheimer's disease Father    Developmental delay Brother      Medications: Patient's Medications  New Prescriptions   No medications on file  Previous Medications   ACETAMINOPHEN  (TYLENOL ) 650 MG CR TABLET    Take 1 tablet (650 mg total) by mouth at bedtime.   ALBUTEROL  (VENTOLIN  HFA) 108 (90 BASE) MCG/ACT INHALER    Inhale 2 puffs into the lungs every 6 (six) hours as needed for wheezing or shortness of breath.   ASPIRIN  EC 81 MG TABLET    Take 1 tablet (81 mg total) by mouth daily. Swallow whole.   AZELASTINE  (ASTELIN ) 0.1 % NASAL SPRAY    USE 2 SPRAYS IN EACH NOSTRIL TWICE A DAY  AS DIRECTED   BUPROPION  (WELLBUTRIN  SR) 200 MG 12 HR TABLET    Take 200 mg by mouth 2 (two) times daily.   BUSPIRONE  (BUSPAR ) 15 MG TABLET    Take 1 tablet (15 mg total) by mouth 2 (two) times daily.   DILTIAZEM  (CARDIZEM  CD) 240 MG 24 HR CAPSULE    Take 240 mg by mouth daily.   DULOXETINE  HCL 40 MG CPEP    Take 40 mg by mouth daily.   FLUTICASONE -SALMETEROL (WIXELA INHUB) 100-50 MCG/ACT AEPB    Inhale 1 puff into the lungs 2 (two) times daily.   GABAPENTIN  (NEURONTIN ) 100 MG CAPSULE    Take 1 capsule (100 mg total) by mouth daily as needed.   HOMEOPATHIC PRODUCTS (ARNICARE EX)    Apply 1 Application topically daily.   LEVOTHYROXINE  (SYNTHROID ) 50 MCG TABLET    Take 1-1.5 tablets (50-75 mcg total) by mouth See admin instructions. Take 75 mg every  Monday, Wednesday and Friday Take 50 mg all the other days   LINACLOTIDE (LINZESS) 290 MCG CAPS CAPSULE    Take 290 mcg by mouth daily before breakfast.   LIOTHYRONINE  (CYTOMEL ) 5 MCG TABLET    Take 0.5 tablets (2.5 mcg total) by mouth daily.   MELATONIN 10 MG TABS    Take 10 mg  by mouth at bedtime. Timed-Release   MONTELUKAST  (SINGULAIR ) 10 MG TABLET    Take 1 tablet (10 mg total) by mouth at bedtime.   MULTIPLE VITAMIN (MULTIVITAMIN) TABLET    Take 1 tablet by mouth daily. Vita Fusion adult Gummie   PANTOPRAZOLE  (PROTONIX ) 40 MG TABLET    Take 1 tablet (40 mg total) by mouth every morning.   PEPPERMINT OIL (IBGARD PO)    Take 1 capsule by mouth 2 (two) times daily with a meal.   POLYETHYL GLYCOL-PROPYL GLYCOL (SYSTANE) 0.4-0.3 % GEL OPHTHALMIC GEL    Place 1 application  into both eyes at bedtime.   POLYETHYLENE GLYCOL POWDER (GLYCOLAX /MIRALAX ) 17 GM/SCOOP POWDER    Take 17 g by mouth daily.   PRAVASTATIN  (PRAVACHOL ) 40 MG TABLET    Take 1 tablet (40 mg total) by mouth daily.   TRAZODONE  (DESYREL ) 50 MG TABLET    Take 1 tablet (50 mg total) by mouth at bedtime.   WHITE PETROLATUM-MINERAL OIL (LUBRICANT PM) OINT    Apply 1 g to eye at bedtime.  Modified Medications   No medications on file  Discontinued Medications   No medications on file     Physical Exam:   Vitals:   03/20/24 1422  BP: 138/76  Pulse: 94  Temp: 97.9 F (36.6 C)  SpO2: 99%  Weight: 143 lb 6.4 oz (65 kg)  Height: 5' 3 (1.6 m)   Body mass index is 25.4 kg/m. Wt Readings from Last 3 Encounters:  03/20/24 143 lb 6.4 oz (65 kg)  03/13/24 141 lb (64 kg)  03/04/24 144 lb 3.2 oz (65.4 kg)     Physical Exam Vitals and nursing note reviewed.  Constitutional:      General: She is not in acute distress.    Appearance: She is not toxic-appearing or diaphoretic.  HENT:     Head: Normocephalic and atraumatic.     Nose: Nose normal.  Eyes:     General: No scleral icterus. Cardiovascular:     Rate and Rhythm: Normal rate and regular rhythm.     Heart sounds: Murmur heard.     Comments: Soft 2 out of 6 systolic ejection murmur  left upper sternal border. Pulmonary:     Effort: Pulmonary effort is normal. No respiratory distress.  Abdominal:     General: Bowel sounds are normal.      Palpations: Abdomen is soft.  Musculoskeletal:     Right lower leg: No edema.     Left lower leg: No edema.  Skin:    General: Skin is warm and dry.     Capillary Refill: Capillary refill takes less than 2 seconds.     Comments: Very thin skin of her upper arms multiple old bruises.  Neurological:     General: No focal deficit present.     Mental Status: She is alert and oriented to person, place, and time.    Physical Exam   SKIN: Skin is very thin.       Labs reviewed: Basic Metabolic Panel: Recent Labs    11/24/23 1844 12/17/23 1538 12/17/23 1802 02/01/24 0745  NA 135 139  --  137  K 3.7 3.4*  --  4.6  CL 103 103  --  101  CO2 23 25  --  28  GLUCOSE 111* 119*  --  92  BUN 13 13  --  12  CREATININE 0.59 1.04*  --  0.69  CALCIUM  8.9 9.2  --  9.6  MG  --   --  2.5*  --    Liver Function Tests: Recent Labs    11/24/23 1844  AST 28  ALT 24  ALKPHOS 118  BILITOT 0.5  PROT 6.9  ALBUMIN 3.8    CBC: Recent Labs    11/24/23 1844 12/17/23 1538  WBC 9.7 5.8  HGB 12.4 11.8*  HCT 37.4 36.4  MCV 92.6 93.6  PLT 381 320   Lipid Panel: Recent Labs    10/02/23 0748  CHOL 194  HDL 88  LDLCALC 87  TRIG 92  CHOLHDL 2.2    A1C: Lab Results  Component Value Date   HGBA1C 5.7 (H) 10/02/2023   Assessment & Plan Invasive ductal carcinoma of left breast [facility]) Patient scheduled for surgery this month for lumpectomy.  She will follow-up with oncology. New diagnosis of left breast cancer, clinical stage 1A, ER/PR positive, HER2 negative. Scheduled for lumpectomy and lymph node dissection on November 25th, 2025. Conservative breast surgery is the standard of care, with mastectomy considered only if necessary. Post-surgery, hormone receptor drugs like Arimidex may be used for five years due to receptor-positive status. - Proceed with scheduled lumpectomy and lymph node dissection on November 25th, 2025. - Will consider hormone receptor drugs post-surgery for five  years     History of Hodgkin's lymphoma Patient follows up with oncology on a regular basis for history of Hodgkin's lymphoma many years ago.     Essential hypertension Blood pressure well-controlled on Cardizem  CD to 40 mg daily.     Gastroesophageal reflux disease without esophagitis Continue the Protonix  40 mg daily.     GAD (generalized anxiety disorder) Continue with Cymbalta  and BuSpar .     MDD (major depressive disorder), recurrent episode, moderate (HCC) She remains on Cymbalta , BuSpar .     Acquired hypothyroidism Stable.  Continue with 75 mcg on Monday Wednesday Friday and 50 mg on Tuesday Thursday Saturday and Sunday     Mixed hyperlipidemia Continue with pravastatin  40 mg daily.     Irritable bowel syndrome with predominant constipation Continue with MiraLAX  and Linzess.  She states that Linzess sometimes gives her diarrhea.  Managed with Linzess, which can cause diarrhea. Uses  MiraLAX  and Citrucel for symptom management. - Continue current management with Linzess, MiraLAX , and Citrucel as needed     Asthma, stable, moderate persistent Continue with as needed albuterol  inhaler, Singulair  10 mg nightly.,  Advair 100/51 puff twice daily     Chronic insomnia Patient is only taking 50 mg of trazodone .  She can increase this to 100 mg nightly as needed.     MCI (mild cognitive impairment) with memory loss Present, with some memory issues. Social worker Andrea Noble will conduct a MOGA assessment. - Will conduct MOGA assessment with social worker Andrea Noble.     OSA (obstructive sleep apnea)  Orders:   traZODone  (DESYREL ) 50 MG tablet; Take 1-2 tablets (50-100 mg total) by mouth at bedtime.    Meds ordered this encounter  Medications   traZODone  (DESYREL ) 50 MG tablet    Sig: Take 1-2 tablets (50-100 mg total) by mouth at bedtime.    Dispense:  180 tablet    Refill:  3     Next appt: 3 months with Greig Cluster, NP or Harlene An, NP  Camellia Door, DO [facility] [phone]

## 2024-03-20 NOTE — Assessment & Plan Note (Addendum)
 She remains on Cymbalta , BuSpar .

## 2024-03-20 NOTE — Assessment & Plan Note (Addendum)
 Continue with Cymbalta  and BuSpar .

## 2024-03-20 NOTE — Assessment & Plan Note (Addendum)
 Patient follows up with oncology on a regular basis for history of Hodgkin's lymphoma many years ago.

## 2024-03-20 NOTE — Assessment & Plan Note (Addendum)
Continue the Protonix '40mg'$  daily

## 2024-03-20 NOTE — Assessment & Plan Note (Addendum)
 Continue with MiraLAX  and Linzess.  She states that Linzess sometimes gives her diarrhea.  Managed with Linzess, which can cause diarrhea. Uses MiraLAX  and Citrucel for symptom management. - Continue current management with Linzess, MiraLAX , and Citrucel as needed

## 2024-03-20 NOTE — Assessment & Plan Note (Addendum)
Continue with pravastatin 40 mg daily

## 2024-03-20 NOTE — Assessment & Plan Note (Signed)
 Orders:    traZODone  (DESYREL ) 50 MG tablet; Take 1-2 tablets (50-100 mg total) by mouth at bedtime.

## 2024-03-20 NOTE — Assessment & Plan Note (Addendum)
 Stable.  Continue with 75 mcg on Monday Wednesday Friday and 50 mg on Tuesday Thursday Saturday and Sunday

## 2024-03-20 NOTE — Assessment & Plan Note (Addendum)
 Patient is only taking 50 mg of trazodone .  She can increase this to 100 mg nightly as needed.

## 2024-03-20 NOTE — Assessment & Plan Note (Addendum)
 Patient scheduled for surgery this month for lumpectomy.  She will follow-up with oncology. New diagnosis of left breast cancer, clinical stage 1A, ER/PR positive, HER2 negative. Scheduled for lumpectomy and lymph node dissection on November 25th, 2025. Conservative breast surgery is the standard of care, with mastectomy considered only if necessary. Post-surgery, hormone receptor drugs like Arimidex may be used for five years due to receptor-positive status. - Proceed with scheduled lumpectomy and lymph node dissection on November 25th, 2025. - Will consider hormone receptor drugs post-surgery for five years

## 2024-03-20 NOTE — Assessment & Plan Note (Addendum)
 Continue with as needed albuterol  inhaler, Singulair  10 mg nightly.,  Advair 100/51 puff twice daily

## 2024-03-21 ENCOUNTER — Telehealth: Payer: Self-pay

## 2024-03-21 ENCOUNTER — Encounter
Admission: RE | Admit: 2024-03-21 | Discharge: 2024-03-21 | Disposition: A | Discharge status: Home / Self Care | Source: Ambulatory Visit | Attending: Surgery | Admitting: Surgery

## 2024-03-21 ENCOUNTER — Other Ambulatory Visit: Payer: Self-pay | Admitting: Orthopedic Surgery

## 2024-03-21 ENCOUNTER — Other Ambulatory Visit: Payer: Self-pay

## 2024-03-21 DIAGNOSIS — G4733 Obstructive sleep apnea (adult) (pediatric): Secondary | ICD-10-CM

## 2024-03-21 HISTORY — DX: Irritable bowel syndrome, unspecified: K58.9

## 2024-03-21 HISTORY — DX: Malignant melanoma of skin, unspecified: C43.9

## 2024-03-21 HISTORY — DX: Other complications of anesthesia, initial encounter: T88.59XA

## 2024-03-21 HISTORY — DX: Palpitations: R00.2

## 2024-03-21 HISTORY — DX: Attention-deficit hyperactivity disorder, unspecified type: F90.9

## 2024-03-21 HISTORY — DX: Atherosclerosis of aorta: I70.0

## 2024-03-21 HISTORY — DX: Radiculopathy, lumbar region: M54.16

## 2024-03-21 HISTORY — DX: Malignant neoplasm of unspecified site of left female breast: C50.912

## 2024-03-21 HISTORY — DX: Obstructive sleep apnea (adult) (pediatric): G47.33

## 2024-03-21 MED ORDER — TRAZODONE HCL 50 MG PO TABS: 100.0000 mg | ORAL_TABLET | Freq: Every day | ORAL | 3 refills | Status: AC

## 2024-03-21 NOTE — Telephone Encounter (Signed)
 Spoke with patient and she confirmed that she is using [employer] and she takes the medication as Dr.Chen wrote it. Patient states maybe it is best to resubmit rx for 2 by at bedtime although she knows she to take 1-2.

## 2024-03-21 NOTE — Telephone Encounter (Signed)
 This patient lives in independent living at [facility]. Can we clarify pharmacy. Most residents manage their own medication and she could certainly manage this.

## 2024-03-21 NOTE — Telephone Encounter (Signed)
 New prescription for Trazodone  sent to [facility]. Prescribed for 2 tablets, she can always take one as well.

## 2024-03-21 NOTE — Patient Instructions (Addendum)
 Your procedure is scheduled on:03-26-24 Tuesday Report to the Registration Desk on the 1st floor of the Medical Mall. Then proceed to the Radiology Desk (2nd desk on right) Arrive at 7:30 am   REMEMBER: Instructions that are not followed completely may result in serious medical risk, up to and including death; or upon the discretion of your surgeon and anesthesiologist your surgery may need to be rescheduled.  Do not eat food after midnight the night before surgery.  No gum chewing or hard candies.  You may however, drink CLEAR liquids up to 2 hours before you are scheduled to arrive for your surgery. Do not drink anything within 2 hours of your scheduled arrival time.  Clear liquids include: - water  - apple juice without pulp - gatorade (not RED colors) - black coffee or tea (Do NOT add milk or creamers to the coffee or tea) Do NOT drink anything that is not on this list.  One week prior to surgery:Stop NOW (03-21-24) Stop Anti-inflammatories (NSAIDS) such as Advil, Aleve, Ibuprofen, Motrin, Naproxen, Naprosyn and Aspirin based products such as Excedrin, Goody's Powder, BC Powder. Stop ANY OVER THE COUNTER supplements until after surgery (Multivitamin, Melatonin, Calcium  minis)  You may however, continue to take Tylenol  if needed for pain up until the day of surgery.  Continue taking all of your other prescription medications up until the day of surgery.  ON THE DAY OF SURGERY ONLY TAKE THESE MEDICATIONS WITH SIPS OF WATER: -buPROPion  (WELLBUTRIN  SR)  -busPIRone  (BUSPAR )  -diltiazem  (CARDIZEM  CD)  -DULoxetine  HCl  -levothyroxine  (SYNTHROID )  -liothyronine  (CYTOMEL )  -pantoprazole  (PROTONIX )   Use your Wixela Inhaler the day of surgery and bring your Albuterol  Inhaler to the hospital  Last dose of Aspirin was on 03-19-24   No Alcohol  for 24 hours before or after surgery.  No Smoking including e-cigarettes for 24 hours before surgery.  No chewable tobacco products for at  least 6 hours before surgery.  No nicotine patches on the day of surgery.  Do not use any recreational drugs for at least a week (preferably 2 weeks) before your surgery.  Please be advised that the combination of cocaine and anesthesia may have negative outcomes, up to and including death. If you test positive for cocaine, your surgery will be cancelled.  On the morning of surgery brush your teeth with toothpaste and water, you may rinse your mouth with mouthwash if you wish. Do not swallow any toothpaste or mouthwash.  Use CHG Soap as directed on instruction sheet.  Do not wear jewelry, make-up, hairpins, clips or nail polish.  For welded (permanent) jewelry: bracelets, anklets, waist bands, etc.  Please have this removed prior to surgery.  If it is not removed, there is a chance that hospital personnel will need to cut it off on the day of surgery.  Do not wear lotions, powders, or perfumes.   Do not shave body hair from the neck down 48 hours before surgery.  Contact lenses, hearing aids and dentures may not be worn into surgery.  Do not bring valuables to the hospital. [facility] is not responsible for any missing/lost belongings or valuables.   Bring your C-PAP to the hospital  Notify your doctor if there is any change in your medical condition (cold, fever, infection).  Wear comfortable clothing (specific to your surgery type) to the hospital.  After surgery, you can help prevent lung complications by doing breathing exercises.  Take deep breaths and cough every 1-2 hours. Your doctor may  order a device called an Incentive Spirometer to help you take deep breaths. When coughing or sneezing, hold a pillow firmly against your incision with both hands. This is called "splinting." Doing this helps protect your incision. It also decreases belly discomfort.  If you are being admitted to the hospital overnight, leave your suitcase in the car. After surgery it may be brought to  your room.  In case of increased patient census, it may be necessary for you, the patient, to continue your postoperative care in the Same Day Surgery department.  If you are being discharged the day of surgery, you will not be allowed to drive home. You will need a responsible individual to drive you home and stay with you for 24 hours after surgery.   If you are taking public transportation, you will need to have a responsible individual with you.  Please call the Pre-admissions Testing Dept. at [phone] if you have any questions about these instructions.  Surgery Visitation Policy:  Patients having surgery or a procedure may have two visitors.  Children under the age of 16 must have an adult with them who is not the patient.                                                                                                             Preparing for Surgery with CHLORHEXIDINE  GLUCONATE (CHG) Soap  Chlorhexidine  Gluconate (CHG) Soap  o An antiseptic cleaner that kills germs and bonds with the skin to continue killing germs even after washing  o Used for showering the night before surgery and morning of surgery  Before surgery, you can play an important role by reducing the number of germs on your skin.  CHG (Chlorhexidine  gluconate) soap is an antiseptic cleanser which kills germs and bonds with the skin to continue killing germs even after washing.  Please do not use if you have an allergy to CHG or antibacterial soaps. If your skin becomes reddened/irritated stop using the CHG.  1. Shower the NIGHT BEFORE SURGERY with CHG soap.  2. If you choose to wash your hair, wash your hair first as usual with your normal shampoo.  3. After shampooing, rinse your hair and body thoroughly to remove the shampoo.  4. Use CHG as you would any other liquid soap. You can apply CHG directly to the skin and wash gently with a clean washcloth.  5. Apply the CHG soap to your body only from the  neck down. Do not use on open wounds or open sores. Avoid contact with your eyes, ears, mouth, and genitals (private parts). Wash face and genitals (private parts) with your normal soap.  6. Wash thoroughly, paying special attention to the area where your surgery will be performed.  7. Thoroughly rinse your body with warm water.  8. Do not shower/wash with your normal soap after using and rinsing off the CHG soap.  9. Do not use lotions, oils, etc., after showering with CHG.  10. Pat yourself dry with a clean towel.  11. Wear  clean pajamas to bed the night before surgery.  12. Place clean sheets on your bed the night of your shower and do not sleep with pets.  13. Do not apply any deodorants/lotions/powders.  14. Please wear clean clothes to the hospital.  15. Remember to brush your teeth with your regular toothpaste.   Merchandiser, Retail to address health-related social needs:  [email or web address]

## 2024-03-21 NOTE — Telephone Encounter (Signed)
 Copied from CRM [id number]. Topic: Clinical - Prescription Issue >> Mar 21, 2024 10:22 AM Miquel SAILOR wrote: Reason for CRM: ZODone (DESYREL ) 50 MG tablet [[id number]]- [place] from [place] [facility]/[phone]-Requesting instructions has to be either or due to living in a facility. Needs call back on calcification  Sig: Take 1-2 tablets (50-100 mg total) by mouth at bedtime(Needs clarify)

## 2024-03-26 ENCOUNTER — Other Ambulatory Visit: Payer: Self-pay

## 2024-03-26 ENCOUNTER — Ambulatory Visit
Admission: RE | Admit: 2024-03-26 | Discharge: 2024-03-26 | Disposition: A | Discharge status: Home / Self Care | Source: Ambulatory Visit | Attending: Surgery | Admitting: Surgery

## 2024-03-26 ENCOUNTER — Ambulatory Visit
Admission: RE | Admit: 2024-03-26 | Discharge: 2024-03-26 | Disposition: A | Discharge status: Home / Self Care | Attending: Surgery | Admitting: Surgery

## 2024-03-26 ENCOUNTER — Encounter: Payer: Self-pay | Admitting: Surgery

## 2024-03-26 ENCOUNTER — Ambulatory Visit: Admitting: Certified Registered"

## 2024-03-26 ENCOUNTER — Encounter
Admission: RE | Disposition: A | Discharge status: Home / Self Care | Payer: Self-pay | Source: Home / Self Care | Attending: Surgery

## 2024-03-26 DIAGNOSIS — G4733 Obstructive sleep apnea (adult) (pediatric): Secondary | ICD-10-CM | POA: Diagnosis not present

## 2024-03-26 DIAGNOSIS — I10 Essential (primary) hypertension: Secondary | ICD-10-CM | POA: Diagnosis not present

## 2024-03-26 DIAGNOSIS — J45909 Unspecified asthma, uncomplicated: Secondary | ICD-10-CM | POA: Insufficient documentation

## 2024-03-26 DIAGNOSIS — F32A Depression, unspecified: Secondary | ICD-10-CM | POA: Diagnosis not present

## 2024-03-26 DIAGNOSIS — R7303 Prediabetes: Secondary | ICD-10-CM | POA: Diagnosis not present

## 2024-03-26 DIAGNOSIS — C50512 Malignant neoplasm of lower-outer quadrant of left female breast: Secondary | ICD-10-CM

## 2024-03-26 DIAGNOSIS — K219 Gastro-esophageal reflux disease without esophagitis: Secondary | ICD-10-CM | POA: Diagnosis not present

## 2024-03-26 DIAGNOSIS — Z825 Family history of asthma and other chronic lower respiratory diseases: Secondary | ICD-10-CM | POA: Diagnosis not present

## 2024-03-26 DIAGNOSIS — Z818 Family history of other mental and behavioral disorders: Secondary | ICD-10-CM | POA: Diagnosis not present

## 2024-03-26 DIAGNOSIS — Z8571 Personal history of Hodgkin lymphoma: Secondary | ICD-10-CM | POA: Diagnosis not present

## 2024-03-26 DIAGNOSIS — E039 Hypothyroidism, unspecified: Secondary | ICD-10-CM | POA: Diagnosis not present

## 2024-03-26 DIAGNOSIS — Z170 Estrogen receptor positive status [ER+]: Secondary | ICD-10-CM | POA: Diagnosis not present

## 2024-03-26 DIAGNOSIS — F419 Anxiety disorder, unspecified: Secondary | ICD-10-CM | POA: Insufficient documentation

## 2024-03-26 DIAGNOSIS — Z803 Family history of malignant neoplasm of breast: Secondary | ICD-10-CM | POA: Insufficient documentation

## 2024-03-26 DIAGNOSIS — I471 Supraventricular tachycardia, unspecified: Secondary | ICD-10-CM | POA: Insufficient documentation

## 2024-03-26 DIAGNOSIS — M1711 Unilateral primary osteoarthritis, right knee: Secondary | ICD-10-CM

## 2024-03-26 HISTORY — PX: AXILLARY SENTINEL NODE BIOPSY: SHX5738

## 2024-03-26 HISTORY — PX: BREAST LUMPECTOMY WITH RADIO FREQUENCY LOCALIZER: SHX6897

## 2024-03-26 SURGERY — BREAST LUMPECTOMY WITH RADIO FREQUENCY LOCALIZER
Anesthesia: General | Site: Breast | Laterality: Left

## 2024-03-26 MED ORDER — DEXAMETHASONE SOD PHOSPHATE PF 10 MG/ML IJ SOLN
INTRAMUSCULAR | Status: DC | PRN
  Administered 2024-03-26: 10 mg via INTRAVENOUS

## 2024-03-26 MED ORDER — CHLORHEXIDINE GLUCONATE 0.12 % MT SOLN
15.0000 mL | Freq: Once | OROMUCOSAL | Status: AC
  Administered 2024-03-26: 15 mL via OROMUCOSAL

## 2024-03-26 MED ORDER — ACETAMINOPHEN ER 650 MG PO TBCR
650.0000 mg | EXTENDED_RELEASE_TABLET | Freq: Three times a day (TID) | ORAL | 0 refills | Status: AC | PRN
  Filled 2024-03-26: qty 30, 10d supply, fill #0

## 2024-03-26 MED ORDER — MIDAZOLAM HCL 2 MG/2ML IJ SOLN
INTRAMUSCULAR | Status: AC
  Filled 2024-03-26: qty 2

## 2024-03-26 MED ORDER — CEFAZOLIN SODIUM-DEXTROSE 2-4 GM/100ML-% IV SOLN
INTRAVENOUS | Status: AC
  Filled 2024-03-26: qty 100

## 2024-03-26 MED ORDER — SODIUM CHLORIDE (PF) 0.9 % IJ SOLN
INTRAMUSCULAR | Status: AC
  Filled 2024-03-26: qty 10

## 2024-03-26 MED ORDER — IBUPROFEN 800 MG PO TABS
800.0000 mg | ORAL_TABLET | Freq: Three times a day (TID) | ORAL | 0 refills | Status: DC | PRN
  Filled 2024-03-26: qty 30, 10d supply, fill #0

## 2024-03-26 MED ORDER — PROPOFOL 1000 MG/100ML IV EMUL
INTRAVENOUS | Status: AC
  Filled 2024-03-26: qty 100

## 2024-03-26 MED ORDER — ORAL CARE MOUTH RINSE: 15.0000 mL | Freq: Once | OROMUCOSAL | Status: AC

## 2024-03-26 MED ORDER — SODIUM CHLORIDE (PF) 0.9 % IJ SOLN
PREFILLED_SYRINGE | INTRAVENOUS | Status: DC | PRN
  Administered 2024-03-26: 10 mL

## 2024-03-26 MED ORDER — OXYCODONE-ACETAMINOPHEN 5-325 MG PO TABS
1.0000 | ORAL_TABLET | Freq: Three times a day (TID) | ORAL | 0 refills | Status: DC | PRN
  Filled 2024-03-26: qty 6, 2d supply, fill #0

## 2024-03-26 MED ORDER — CHLORHEXIDINE GLUCONATE CLOTH 2 % EX PADS: 6.0000 | MEDICATED_PAD | Freq: Once | CUTANEOUS | Status: DC

## 2024-03-26 MED ORDER — BUPIVACAINE-EPINEPHRINE (PF) 0.5% -1:200000 IJ SOLN
INTRAMUSCULAR | Status: AC
  Filled 2024-03-26: qty 30

## 2024-03-26 MED ORDER — DROPERIDOL 2.5 MG/ML IJ SOLN
0.6250 mg | Freq: Once | INTRAMUSCULAR | Status: AC | PRN
  Administered 2024-03-26: 0.625 mg via INTRAVENOUS

## 2024-03-26 MED ORDER — FENTANYL CITRATE (PF) 100 MCG/2ML IJ SOLN
INTRAMUSCULAR | Status: AC
  Filled 2024-03-26: qty 2

## 2024-03-26 MED ORDER — LACTATED RINGERS IV SOLN: INTRAVENOUS | Status: DC

## 2024-03-26 MED ORDER — PHENYLEPHRINE HCL-NACL 20-0.9 MG/250ML-% IV SOLN
INTRAVENOUS | Status: DC | PRN
  Administered 2024-03-26: 20 ug/min via INTRAVENOUS

## 2024-03-26 MED ORDER — HYDROMORPHONE HCL 1 MG/ML IJ SOLN
INTRAMUSCULAR | Status: AC
  Filled 2024-03-26: qty 1

## 2024-03-26 MED ORDER — STERILE WATER FOR IRRIGATION IR SOLN
Status: DC | PRN
  Administered 2024-03-26: 500 mL

## 2024-03-26 MED ORDER — CHLORHEXIDINE GLUCONATE 0.12 % MT SOLN
OROMUCOSAL | Status: AC
Start: 2024-03-26 — End: 2024-03-26
  Filled 2024-03-26: qty 15

## 2024-03-26 MED ORDER — FENTANYL CITRATE (PF) 100 MCG/2ML IJ SOLN: 25.0000 ug | INTRAMUSCULAR | Status: DC | PRN

## 2024-03-26 MED ORDER — PHENYLEPHRINE 80 MCG/ML (10ML) SYRINGE FOR IV PUSH (FOR BLOOD PRESSURE SUPPORT)
PREFILLED_SYRINGE | INTRAVENOUS | Status: DC | PRN
  Administered 2024-03-26: 160 ug via INTRAVENOUS

## 2024-03-26 MED ORDER — LIDOCAINE HCL (CARDIAC) PF 100 MG/5ML IV SOSY
PREFILLED_SYRINGE | INTRAVENOUS | Status: DC | PRN
  Administered 2024-03-26: 100 mg via INTRAVENOUS

## 2024-03-26 MED ORDER — TECHNETIUM TC 99M TILMANOCEPT KIT
1.1200 | PACK | Freq: Once | INTRAVENOUS | Status: AC | PRN
  Administered 2024-03-26: 1.12 via INTRADERMAL

## 2024-03-26 MED ORDER — DEXMEDETOMIDINE HCL IN NACL 80 MCG/20ML IV SOLN
INTRAVENOUS | Status: DC | PRN
  Administered 2024-03-26: 12 ug via INTRAVENOUS

## 2024-03-26 MED ORDER — MIDAZOLAM HCL (PF) 2 MG/2ML IJ SOLN
INTRAMUSCULAR | Status: DC | PRN
  Administered 2024-03-26: 2 mg via INTRAVENOUS

## 2024-03-26 MED ORDER — CEFAZOLIN SODIUM-DEXTROSE 2-4 GM/100ML-% IV SOLN
2.0000 g | INTRAVENOUS | Status: AC
  Administered 2024-03-26: 2 g via INTRAVENOUS

## 2024-03-26 MED ORDER — DROPERIDOL 2.5 MG/ML IJ SOLN
INTRAMUSCULAR | Status: AC
Start: 2024-03-26 — End: 2024-03-26
  Filled 2024-03-26: qty 2

## 2024-03-26 MED ORDER — ONDANSETRON HCL 4 MG/2ML IJ SOLN
INTRAMUSCULAR | Status: DC | PRN
  Administered 2024-03-26: 4 mg via INTRAVENOUS

## 2024-03-26 MED ORDER — METHYLENE BLUE (ANTIDOTE) 1 % IV SOLN
INTRAVENOUS | Status: AC
  Filled 2024-03-26: qty 10

## 2024-03-26 MED ORDER — ACETAMINOPHEN 10 MG/ML IV SOLN
INTRAVENOUS | Status: DC | PRN
  Administered 2024-03-26: 1000 mg via INTRAVENOUS

## 2024-03-26 MED ORDER — FENTANYL CITRATE (PF) 100 MCG/2ML IJ SOLN
INTRAMUSCULAR | Status: DC | PRN
  Administered 2024-03-26: 25 ug via INTRAVENOUS
  Administered 2024-03-26: 50 ug via INTRAVENOUS
  Administered 2024-03-26: 25 ug via INTRAVENOUS

## 2024-03-26 MED ORDER — METOPROLOL TARTRATE 5 MG/5ML IV SOLN
INTRAVENOUS | Status: DC | PRN
  Administered 2024-03-26: 2 mg via INTRAVENOUS
  Administered 2024-03-26: 1 mg via INTRAVENOUS

## 2024-03-26 MED ORDER — HYDROMORPHONE HCL 1 MG/ML IJ SOLN
INTRAMUSCULAR | Status: DC | PRN
  Administered 2024-03-26 (×2): .5 mg via INTRAVENOUS

## 2024-03-26 MED ORDER — ACETAMINOPHEN 10 MG/ML IV SOLN
INTRAVENOUS | Status: AC
  Filled 2024-03-26: qty 100

## 2024-03-26 MED ORDER — BUPIVACAINE-EPINEPHRINE 0.5% -1:200000 IJ SOLN
INTRAMUSCULAR | Status: DC | PRN
Start: 2024-03-26 — End: 2024-03-26
  Administered 2024-03-26: 20 mL

## 2024-03-26 MED ORDER — DOCUSATE SODIUM 100 MG PO CAPS
100.0000 mg | ORAL_CAPSULE | Freq: Two times a day (BID) | ORAL | 0 refills | Status: AC | PRN
  Filled 2024-03-26: qty 20, 10d supply, fill #0

## 2024-03-26 MED ORDER — PROPOFOL 10 MG/ML IV BOLUS
INTRAVENOUS | Status: DC | PRN
Start: 2024-03-26 — End: 2024-03-26
  Administered 2024-03-26: 125 ug/kg/min via INTRAVENOUS

## 2024-03-26 MED ORDER — GLYCOPYRROLATE 0.2 MG/ML IJ SOLN
INTRAMUSCULAR | Status: DC | PRN
  Administered 2024-03-26: .2 mg via INTRAVENOUS

## 2024-03-26 SURGICAL SUPPLY — 34 items
BLADE PHOTON 3 ILLUMINATED (MISCELLANEOUS) ×1 IMPLANT
BLADE SURG 15 STRL LF DISP TIS (BLADE) ×1 IMPLANT
CHLORAPREP W/TINT 26 (MISCELLANEOUS) IMPLANT
COVER PROBE GAMMA FINDER SLV (MISCELLANEOUS) ×1 IMPLANT
DERMABOND ADVANCED .7 DNX12 (GAUZE/BANDAGES/DRESSINGS) ×1 IMPLANT
DEVICE DUBIN SPECIMEN MAMMOGRA (MISCELLANEOUS) ×1 IMPLANT
DRAPE LAPAROTOMY TRNSV 106X77 (MISCELLANEOUS) ×1 IMPLANT
DRAPE SHEET LG 3/4 BI-LAMINATE (DRAPES) IMPLANT
DRSG TEGADERM 4X4.75 (GAUZE/BANDAGES/DRESSINGS) IMPLANT
ELECTRODE REM PT RTRN 9FT ADLT (ELECTROSURGICAL) ×1 IMPLANT
GAUZE 4X4 16PLY ~~LOC~~+RFID DBL (SPONGE) IMPLANT
GLOVE BIOGEL PI IND STRL 7.0 (GLOVE) ×1 IMPLANT
GLOVE SURG SYN 6.5 PF PI (GLOVE) ×3 IMPLANT
GOWN STRL REUS W/ TWL LRG LVL3 (GOWN DISPOSABLE) ×3 IMPLANT
KIT MARKER MARGIN INK (KITS) ×1 IMPLANT
KIT TURNOVER KIT A (KITS) ×1 IMPLANT
LABEL OR SOLS (LABEL) ×1 IMPLANT
LIGHT WAVEGUIDE WIDE FLAT (MISCELLANEOUS) IMPLANT
MANIFOLD NEPTUNE II (INSTRUMENTS) ×1 IMPLANT
MARKER MARGIN CORRECT CLIP (MARKER) ×1 IMPLANT
NDL HYPO 22X1.5 SAFETY MO (MISCELLANEOUS) ×2 IMPLANT
PACK BASIN MINOR ARMC (MISCELLANEOUS) ×1 IMPLANT
SHEATH BREAST BIOPSY SKIN MKR (SHEATH) ×1 IMPLANT
SOLN STERILE WATER 500 ML (IV SOLUTION) ×1 IMPLANT
SOLN STERILE WATER BTL 1000 ML (IV SOLUTION) ×1 IMPLANT
SPONGE VERSALON 4X4 4PLY (MISCELLANEOUS) IMPLANT
STRIP CLOSURE SKIN 1/4X4 (GAUZE/BANDAGES/DRESSINGS) IMPLANT
SUT SILK 2-0 30XBRD TIE 12 (SUTURE) IMPLANT
SUT SILK 3 0 12 30 (SUTURE) IMPLANT
SUT VIC AB 3-0 SH 27X BRD (SUTURE) ×1 IMPLANT
SUTURE MNCRL 4-0 27XMF (SUTURE) ×1 IMPLANT
SYR 20ML LL LF (SYRINGE) ×1 IMPLANT
TRAP FLUID SMOKE EVACUATOR (MISCELLANEOUS) ×1 IMPLANT
TRAP NEPTUNE SPECIMEN COLLECT (MISCELLANEOUS) ×1 IMPLANT

## 2024-03-26 NOTE — Anesthesia Preprocedure Evaluation (Signed)
 Anesthesia Evaluation  Patient identified by MRN, date of birth, ID band Patient awake    Reviewed: Allergy & Precautions, NPO status , Patient's Chart, lab work & pertinent test results  History of Anesthesia Complications Negative for: history of anesthetic complications  Airway Mallampati: II   Neck ROM: Full    Dental  (+) Implants, Dental Advidsory Given   Pulmonary neg shortness of breath, asthma , sleep apnea and Continuous Positive Airway Pressure Ventilation , neg COPD, neg recent URI   Pulmonary exam normal breath sounds clear to auscultation       Cardiovascular hypertension, (-) angina (-) Past MI and (-) Cardiac Stents Normal cardiovascular exam+ dysrhythmias Supra Ventricular Tachycardia + Valvular Problems/Murmurs  Rhythm:Regular Rate:Normal     Neuro/Psych  PSYCHIATRIC DISORDERS Anxiety Depression    negative neurological ROS     GI/Hepatic Neg liver ROS,GERD  ,,  Endo/Other  diabetes (borderline)Hypothyroidism    Renal/GU negative Renal ROS     Musculoskeletal   Abdominal   Peds  Hematology negative hematology ROS (+) Hodgkin lymphoma 2006   Anesthesia Other Findings Past Medical History: No date: ADHD (attention deficit hyperactivity disorder) No date: Anxiety No date: Arthritis No date: Asthma No date: Atherosclerosis of aorta No date: Cataract No date: Chronic constipation No date: Chronic insomnia No date: Complication of anesthesia     Comment:  delayed emergence x1 No date: Depression No date: Diverticulitis No date: GAD (generalized anxiety disorder) No date: GERD (gastroesophageal reflux disease) 2006: Hodgkin's lymphoma (HCC)     Comment:  underwent radiation and chemo, last CT scan since moving              to [place] from [place] 2018 No date: Hyperlipidemia No date: Hypertension No date: Hypothyroidism No date: IBS (irritable bowel syndrome) No date: Invasive ductal carcinoma of  breast, female, left (HCC) No date: Lumbar radiculopathy No date: Melanoma (HCC) No date: Menopausal syndrome No date: OAB (overactive bladder) No date: OSA on CPAP No date: Palpitations No date: Paresthesia No date: Pneumonia No date: PSVT (paroxysmal supraventricular tachycardia) No date: Seasonal allergies   Reproductive/Obstetrics                              Anesthesia Physical Anesthesia Plan  ASA: 2  Anesthesia Plan: General   Post-op Pain Management:    Induction: Intravenous  PONV Risk Score and Plan: 3 and Treatment may vary due to age or medical condition, Ondansetron  and Dexamethasone   Airway Management Planned: LMA  Additional Equipment:   Intra-op Plan:   Post-operative Plan: Extubation in OR  Informed Consent: I have reviewed the patients History and Physical, chart, labs and discussed the procedure including the risks, benefits and alternatives for the proposed anesthesia with the patient or authorized representative who has indicated his/her understanding and acceptance.       Plan Discussed with: CRNA  Anesthesia Plan Comments: (LMA/GETA backup discussed.  Patient consented for risks of anesthesia including but not limited to:  - adverse reactions to medications - damage to eyes, teeth, lips or other oral mucosa - nerve damage due to positioning  - sore throat or hoarseness - damage to heart, brain, nerves, lungs, other parts of body or loss of life  Informed patient about role of CRNA in peri- and intra-operative care.  Patient voiced understanding.)         Anesthesia Quick Evaluation

## 2024-03-26 NOTE — Transfer of Care (Signed)
 Immediate Anesthesia Transfer of Care Note  Patient: Andrea Noble  Procedure(s) Performed: BREAST LUMPECTOMY WITH RADIO FREQUENCY LOCALIZER (Left: Breast) BIOPSY, LYMPH NODE, SENTINEL, AXILLARY (Left: Breast)  Patient Location: PACU  Anesthesia Type:General  Level of Consciousness: drowsy  Airway & Oxygen Therapy: Patient Spontanous Breathing and Patient connected to face mask oxygen  Post-op Assessment: Report given to RN and Post -op Vital signs reviewed and stable  Post vital signs: Reviewed and stable  Last Vitals:  Vitals Value Taken Time  BP 141/65 03/26/24 11:28  Temp    Pulse 87 03/26/24 11:30  Resp 17 03/26/24 11:30  SpO2 100 % 03/26/24 11:30  Vitals shown include unfiled device data.  Last Pain:  Vitals:   03/26/24 0816  TempSrc: Temporal         Complications: There were no known notable events for this encounter.

## 2024-03-26 NOTE — Discharge Instructions (Addendum)
 Removal, Care After This sheet gives you information about how to care for yourself after your procedure. Your health care provider may also give you more specific instructions. If you have problems or questions, contact your health care provider. What can I expect after the procedure? After the procedure, it is common to have: Soreness. Bruising. Itching. Follow these instructions at home: site care Follow instructions from your health care provider about how to take care of your site. Make sure you: Wash your hands with soap and water  before and after you change your bandage (dressing). If soap and water  are not available, use hand sanitizer. Leave stitches (sutures), skin glue, or adhesive strips in place. These skin closures may need to stay in place for 2 weeks or longer. If adhesive strip edges start to loosen and curl up, you may trim the loose edges. Do not remove adhesive strips completely unless your health care provider tells you to do that. If the area bleeds or bruises, apply gentle pressure for 10 minutes. OK TO SHOWER IN 24HRS  Check your site every day for signs of infection. Check for: Redness, swelling, or pain. Fluid or blood. Warmth. Pus or a bad smell.  General instructions Rest and then return to your normal activities as told by your health care provider.  Tylenol  as needed for discomfort.     Use narcotics, if prescribed, only when tylenol  is not enough to control pain.  325-650mg  every 8hrs to max of 3000mg /24hrs (including the 325mg  in every norco dose) for the tylenol .   RESUME ASPIRIN  IN 48HRS Keep all follow-up visits as told by your health care provider. This is important. Contact a health care provider if: You have redness, swelling, or pain around your site. You have fluid or blood coming from your site. Your site feels warm to the touch. You have pus or a bad smell coming from your site. You have a fever. Your sutures, skin glue, or adhesive strips  loosen or come off sooner than expected. Get help right away if: You have bleeding that does not stop with pressure or a dressing. Summary After the procedure, it is common to have some soreness, bruising, and itching at the site. Follow instructions from your health care provider about how to take care of your site. Check your site every day for signs of infection. Contact a health care provider if you have redness, swelling, or pain around your site, or your site feels warm to the touch. Keep all follow-up visits as told by your health care provider. This is important. This information is not intended to replace advice given to you by your health care provider. Make sure you discuss any questions you have with your health care provider. Document Released: 05/15/2015 Document Revised: 10/16/2017 Document Reviewed: 10/16/2017 Elsevier Interactive Patient Education  [employer].

## 2024-03-26 NOTE — Op Note (Signed)
 Preoperative diagnosis: Left breast carcinoma.  Postoperative diagnosis: Same.   Procedure: SCOUT tag-localized left breast partial mastectomy.                      Left axillary Sentinel Lymph node biopsy, methylene blue  injection  Anesthesia: GETA  Surgeon: Dr. Henriette Pierre  Wound Classification: Clean  Indications: Patient is a 73 y.o. female with a nonpalpable left breast mass noted on mammography with core biopsy demonstrating cancer, requires SCOUT localizer placement, partial mastectomy for treatment with sentinel lymph node biopsy.   Specimen: Left breast mass, Sentinel Lymph nodes x 1, anterior posterior medial margin  Complications: None  Estimated Blood Loss: 20 mL  Findings: 1. Specimen mammography shows marker and SCOUT localizer on specimen 2. Pathology call refers gross examination of margins was close anterior posterior medial area, margins removed 3. No other palpable mass or lymph node identified.   Operation performed with curative intent:Yes  Tracer(s) used to identify sentinel nodes in the upfront surgery (non-neoadjuvant) setting (select all that apply):Dye and Radioactive Tracer  Tracer(s) used to identify sentinel nodes in the neoadjuvant setting (select all that apply):N/A  All nodes (colored or non-colored) present at the end of a dye-filled lymphatic channel were removed:Yes   All significantly radioactive nodes were removed:Yes  All palpable suspicious nodes were removed:N/A  Biopsy-proven positive nodes marked with clips prior to chemotherapy were identified and removed:N/A    Description of procedure: SCOUT localization was performed by radiology prior to procedure. In the nuclear medicine suite, the subareolar region was injected with Tc-99 sulfur colloid the morning of procedure. Localization studies were reviewed. The patient was taken to the operating room and placed supine on the operating table, and after general anesthesia the left breast  injected with 10 mL of methylene blue  circumferentially around nipple areolar complex.  Breast and axilla were prepped and draped in the usual sterile fashion. A time-out was completed verifying correct patient, procedure, site, positioning, and implant(s) and/or special equipment prior to beginning this procedure.  By identifying the SCOUT localizer, the probable trajectory and location of the mass was visualized. A skin incision was planned in such a way as to minimize the amount of dissection to reach the mass.  The skin incision was made after infusion of local. Flaps were raised and  Sharp and blunt dissection was then taken down to the mass, taking care to include the entire SCOUT localizer and a margin of grossly normal tissue. The specimen was removed. The specimen was oriented with paint. Imaging reviewed and the entire target lesion had been resected, with biopsy clip and localizer within the specimen.Gross margin analysis by pathology  Pathology call gross examination of margins was close anterior posterior medial area.  This area removed, painted and passed off field pending pathology.   A hand-held gamma probe was used to identify the location of the hottest spot in the axilla. An incision was made around the caudal axillary hairline. Sharp and blunt Dissection was carried down to subdermal facias. The probe was placed within wound and again, the point of maximal count was found. Dissection continue until nodule was identified.  No what happened to be filled with dye as well.  The probe was placed in contact with the node and 200 counts were recorded. The node was excised in its entirety. Ex vivo, the node measured 250 counts when placed on the probe. The bed of the node measured less than 10 counts.  No additional hot  spots were identified.  1 additional blue dye filled node noted and this was excised and passed off to pathology as well.  No clinically abnormal nodes were palpated.   Both  wounds irrigated, hemostasis was achieved and the wound closed in layers with  interrupted sutures of 3-0 Vicryl in deep dermal layer and a running subcuticular suture of Monocryl 4-0, then dressed with dermabond. The patient tolerated the procedure well and was taken to the postanesthesia care unit in stable condition. Sponge and instrument count correct at end of procedure.

## 2024-03-26 NOTE — Interval H&P Note (Signed)
 No change. OK to proceed.

## 2024-03-26 NOTE — Anesthesia Procedure Notes (Signed)
 Procedure Name: LMA Insertion Date/Time: 03/26/2024 10:26 AM  Performed by: Norleen Alberta HERO., CRNAPre-anesthesia Checklist: Patient identified, Patient being monitored, Timeout performed, Emergency Drugs available and Suction available Patient Re-evaluated:Patient Re-evaluated prior to induction Oxygen Delivery Method: Circle system utilized Preoxygenation: Pre-oxygenation with 100% oxygen Induction Type: IV induction Ventilation: Mask ventilation without difficulty LMA: LMA inserted LMA Size: 3.0 Tube type: Oral Number of attempts: 1 Placement Confirmation: positive ETCO2 and breath sounds checked- equal and bilateral Tube secured with: Tape Dental Injury: Teeth and Oropharynx as per pre-operative assessment

## 2024-03-27 ENCOUNTER — Ambulatory Visit
Admission: EM | Admit: 2024-03-27 | Discharge: 2024-03-27 | Disposition: A | Discharge status: Home / Self Care | Source: Ambulatory Visit | Attending: Emergency Medicine | Admitting: Emergency Medicine

## 2024-03-27 ENCOUNTER — Encounter: Payer: Self-pay | Admitting: Surgery

## 2024-03-27 DIAGNOSIS — J0100 Acute maxillary sinusitis, unspecified: Secondary | ICD-10-CM

## 2024-03-27 MED ORDER — PREDNISONE 10 MG (21) PO TBPK: ORAL_TABLET | Freq: Every day | ORAL | 0 refills | Status: DC

## 2024-03-27 MED ORDER — AMOXICILLIN-POT CLAVULANATE 875-125 MG PO TABS: 1.0000 | ORAL_TABLET | Freq: Two times a day (BID) | ORAL | 0 refills | Status: DC

## 2024-03-27 NOTE — Discharge Instructions (Signed)
 Begin Augmentin  twice daily for 7 days for treatment of bacteria causing symptoms similar  Start tomorrow take prednisone  every morning with food to reduce sinus pressure, avoid ibuprofen  while taking but may use Tylenol   Continue use of nasal sprays as needed    You can take Tylenol  as needed for fever reduction and pain relief.   For cough: honey 1/2 to 1 teaspoon (you can dilute the honey in water  or another fluid).  You can also use guaifenesin  and dextromethorphan  for cough. You can use a humidifier for chest congestion and cough.  If you don't have a humidifier, you can sit in the bathroom with the hot shower running.      For sore throat: try warm salt water  gargles, cepacol lozenges, throat spray, warm tea or water  with lemon/honey, popsicles or ice, or OTC cold relief medicine for throat discomfort.   For congestion: take a daily anti-histamine like Zyrtec, Claritin, and a oral decongestant, such as pseudoephedrine.  You can also use Flonase  1-2 sprays in each nostril daily.   It is important to stay hydrated: drink plenty of fluids (water , gatorade/powerade/pedialyte, juices, or teas) to keep your throat moisturized and help further relieve irritation/discomfort.

## 2024-03-27 NOTE — ED Provider Notes (Signed)
 [name]    CSN: [id number] Arrival date & time: 03/27/24  1627      History   Chief Complaint Chief Complaint  Patient presents with   Facial Pain   Nasal Congestion    HPI Andrea Noble is a 73 y.o. female.    Patient presents for evaluation of nasal congestion, bilateral ear fullness, sinus pain and pressure to the forehead, worse to the bilateral cheeks, sore throat, productive cough with clear yellow sputum and intermittent headaches present for 3 weeks.  Has experienced nausea but believes this to be related to anesthesia.  Has attempted use of Tylenol , ibuprofen , assisted leaning, welexa, saline spray and montelukast  without relief.  Denies fever, shortness of breath or wheezing  Past Medical History:  Diagnosis Date   ADHD (attention deficit hyperactivity disorder)    Anxiety    Arthritis    Asthma    Atherosclerosis of aorta    Cataract    Chronic constipation    Chronic insomnia    Complication of anesthesia    delayed emergence x1   Depression    Diverticulitis    GAD (generalized anxiety disorder)    GERD (gastroesophageal reflux disease)    Hodgkin's lymphoma (HCC) 2006   underwent radiation and chemo, last CT scan since moving to [place] from [place] 2018   Hyperlipidemia    Hypertension    Hypothyroidism    IBS (irritable bowel syndrome)    Invasive ductal carcinoma of breast, female, left (HCC)    Lumbar radiculopathy    Melanoma (HCC)    Menopausal syndrome    OAB (overactive bladder)    OSA on CPAP    Palpitations    Paresthesia    Pneumonia    PSVT (paroxysmal supraventricular tachycardia)    Seasonal allergies     Patient Active Problem List   Diagnosis Date Noted   Irritable bowel syndrome with predominant constipation 03/20/2024   MCI (mild cognitive impairment) with memory loss 03/20/2024   Invasive ductal carcinoma of left breast (HCC) 03/15/2024   Low back pain 10/25/2023   Diabetes due to undrl condition w oth diabetic  neuro comp (HCC) 09/16/2023   OSA (obstructive sleep apnea) 01/20/2023   Arthralgia of right knee 11/14/2022   Arthritis of right hip 11/14/2022   Arthritis of right knee 11/14/2022   Pain of right hip joint 11/14/2022   Acquired hypothyroidism 02/04/2022   Essential hypertension 03/24/2021   Preventative health care 02/03/2021   Mood disorder 02/03/2021   Atherosclerosis of aorta 02/03/2021   Intermittent asthma, well controlled 07/31/2020   Menopausal syndrome    OAB (overactive bladder)    Seasonal allergies    Lumbar radiculopathy 07/06/2020   Chronic shoulder bursitis, right 10/14/2019   Primary osteoarthritis of both hands 04/30/2018   Postmenopausal 04/30/2018   Arthralgia 12/12/2017   GAD (generalized anxiety disorder) 12/12/2017   Chronic constipation 11/13/2017   Asthma, stable, moderate persistent 05/15/2017   Chronic insomnia 05/15/2017   Hot flashes, menopausal 05/15/2017   Mixed hyperlipidemia 03/14/2017   Gastroesophageal reflux disease without esophagitis 03/14/2017   MDD (major depressive disorder), recurrent episode, moderate (HCC) 03/14/2017   History of Hodgkin's lymphoma 03/01/2017    Past Surgical History:  Procedure Laterality Date   AXILLARY SENTINEL NODE BIOPSY Left 03/26/2024   Procedure: BIOPSY, LYMPH NODE, SENTINEL, AXILLARY;  Surgeon: Tye Millet, DO;  Location: [facility];  Service: General;  Laterality: Left;   BACK SURGERY     basal cell carcinoma surgery  BLEPHAROPLASTY Bilateral    BREAST BIOPSY Right    benign   BREAST BIOPSY  07/27/2018   us  core bx venus COLUMNAR CELL METAPLASIA, AND PSEUDOANGIOMATOUS STROMAL   BREAST BIOPSY Left 03/07/2024   US  LT BREAST BX W LOC DEV 1ST LESION IMG BX SPEC US  GUIDE 03/07/2024 [facility]   BREAST BIOPSY Left 03/20/2024   US  LT BREAST SAVI/RF TAG 1ST LESION US  GUIDE 03/20/2024 [facility]   BREAST LUMPECTOMY WITH RADIO FREQUENCY LOCALIZER Left 03/26/2024   Procedure: BREAST LUMPECTOMY WITH  RADIO FREQUENCY LOCALIZER;  Surgeon: Tye Millet, DO;  Location: [facility];  Service: General;  Laterality: Left;  with radiofrequency tag   CARPAL TUNNEL RELEASE Right    CARPAL TUNNEL RELEASE Left    CATARACT EXTRACTION Bilateral 2015   CLEFT LIP REPAIR     COLONOSCOPY N/A 11/23/2023   Procedure: COLONOSCOPY;  Surgeon: Onita Elspeth Sharper, DO;  Location: [facility] END[facility]COPY;  Service: Gastroenterology;  Laterality: N/A;  DM   COLONOSCOPY WITH PROPOFOL  N/A 07/09/2018   Procedure: COLONOSCOPY WITH PROPOFOL ;  Surgeon: Gaylyn Gladis PENNER, MD;  Location: [facility] ENDOSCOPY;  Service: Endoscopy;  Laterality: N/A;   DE QUERVAIN'S RELEASE Bilateral    DIAGNOSTIC LAPAROSCOPY     endometriosis   ESOPHAGOGASTRODUODENOSCOPY N/A 07/09/2018   Procedure: ESOPHAGOGASTRODUODENOSCOPY (EGD);  Surgeon: Gaylyn Gladis PENNER, MD;  Location: [facility] ENDOSCOPY;  Service: Endoscopy;  Laterality: N/A;   ESOPHAGOGASTRODUODENOSCOPY N/A 11/23/2023   Procedure: EGD (ESOPHAGOGASTRODUODENOSCOPY);  Surgeon: Onita Elspeth Sharper, DO;  Location: [name] ENDOSCOPY;  Service: Gastroenterology;  Laterality: N/A;   FINGER SURGERY Right    Middle finger joint replaced   JOINT REPLACEMENT     MAXIMUM ACCESS (MAS) TRANSFORAMINAL LUMBAR INTERBODY FUSION (TLIF) 2 LEVEL Right 07/06/2020   Procedure: L4/5, L5/S1 TRANSFORAMINAL LUMBAR INTERBODY FUSION (TLIF), L4-S1 PEDICLE SCREWS,DECOMPRESSION ;  Surgeon: Bluford Elspeth, MD;  Location: [facility];  Service: Neurosurgery;  Laterality: Right;   NASAL SINUS SURGERY     POLYPECTOMY  11/23/2023   Procedure: POLYPECTOMY, INTESTINE;  Surgeon: Onita Elspeth Sharper, DO;  Location: [facility] ENDOSCOPY;  Service: Gastroenterology;;   THUMB SURGERY Right    took tendon from forearm and wrapped it around the thumb joint    OB History   No obstetric history on file.      Home Medications    Prior to Admission medications   Medication Sig Start Date End Date Taking? Authorizing Provider  acetaminophen   (TYLENOL ) 650 MG CR tablet Take 1 tablet (650 mg total) by mouth every 8 (eight) hours as needed for pain. 03/26/24   Sakai, Isami, DO  albuterol  (VENTOLIN  HFA) 108 (90 Base) MCG/ACT inhaler Inhale 2 puffs into the lungs every 6 (six) hours as needed for wheezing or shortness of breath. 11/30/23   Caro Harlene POUR, NP  aspirin  EC 81 MG tablet Take 1 tablet (81 mg total) by mouth daily. Swallow whole. 01/30/24   Eubanks, Jessica K, NP  azelastine  (ASTELIN ) 0.1 % nasal spray USE 2 SPRAYS IN EACH NOSTRIL TWICE A DAY AS DIRECTED 03/10/23   Jimmy Charlie FERNS, MD  buPROPion  (WELLBUTRIN  SR) 200 MG 12 hr tablet Take 200 mg by mouth 2 (two) times daily.    [provider]  busPIRone  (BUSPAR ) 15 MG tablet Take 1 tablet (15 mg total) by mouth 2 (two) times daily. 09/29/23   Abdul Fine, MD  Calcium  Carbonate-Vit D-Min (CALTRATE 600+D PLUS MINIS PO) Take 2 tablets by mouth every morning.    [provider]  diltiazem  (CARDIZEM  CD) 240 MG 24  hr capsule Take 240 mg by mouth daily with lunch.    [provider]  docusate sodium  (COLACE) 100 MG capsule Take 1 capsule (100 mg total) by mouth 2 (two) times daily as needed for up to 10 days for mild constipation. 03/26/24 04/05/24  Tye, Isami, DO  DULoxetine  HCl 40 MG CPEP Take 40 mg by mouth every morning.    [provider]  fluticasone -salmeterol (WIXELA INHUB) 100-50 MCG/ACT AEPB Inhale 1 puff into the lungs 2 (two) times daily. 09/29/23   Abdul Fine, MD  gabapentin  (NEURONTIN ) 100 MG capsule Take 1 capsule (100 mg total) by mouth daily as needed. 09/29/23   Abdul Fine, MD  Homeopathic Products (ARNICARE EX) Apply 1 Application topically daily.    [provider]  ibuprofen  (ADVIL ) 800 MG tablet Take 1 tablet (800 mg total) by mouth every 8 (eight) hours as needed for mild pain (pain score 1-3) or moderate pain (pain score 4-6). 03/26/24   Tye, Isami, DO  levothyroxine  (SYNTHROID ) 50 MCG tablet Take 1-1.5  tablets (50-75 mcg total) by mouth See admin instructions. Take 75 mg every  Monday, Wednesday and Friday Take 50 mg all the other days 09/29/23   Abdul Fine, MD  linaclotide LARUE) 290 MCG CAPS capsule Take 290 mcg by mouth daily before breakfast. 03/04/24   [provider]  liothyronine  (CYTOMEL ) 5 MCG tablet Take 0.5 tablets (2.5 mcg total) by mouth daily. Patient taking differently: Take 2.5 mcg by mouth every morning. 09/29/23   Abdul Fine, MD  Melatonin 10 MG TABS Take 10 mg by mouth at bedtime. Timed-Release 09/29/23   Abdul Fine, MD  montelukast  (SINGULAIR ) 10 MG tablet Take 1 tablet (10 mg total) by mouth at bedtime. Patient taking differently: Take 5 mg by mouth at bedtime. 09/29/23   Abdul Fine, MD  Multiple Vitamin (MULTIVITAMIN) tablet Take 1 tablet by mouth daily. Vita Fusion adult Gummie 09/29/23   Abdul Fine, MD  oxyCODONE -acetaminophen  (PERCOCET) 5-325 MG tablet Take 1 tablet by mouth every 8 (eight) hours as needed for severe pain (pain score 7-10). 03/26/24 03/26/25  Sakai, Isami, DO  pantoprazole  (PROTONIX ) 40 MG tablet Take 1 tablet (40 mg total) by mouth every morning. 10/20/23   Abdul Fine, MD  Peppermint Oil (IBGARD PO) Take 1 capsule by mouth 2 (two) times daily with a meal.    [provider]  Polyethyl Glycol-Propyl Glycol (SYSTANE) 0.4-0.3 % GEL ophthalmic gel Place 1 application  into both eyes at bedtime.    [provider]  polyethylene glycol powder (GLYCOLAX /MIRALAX ) 17 GM/SCOOP powder Take 17 g by mouth daily. 09/29/23   Abdul Fine, MD  pravastatin  (PRAVACHOL ) 40 MG tablet Take 1 tablet (40 mg total) by mouth daily. Patient taking differently: Take 40 mg by mouth at bedtime. 09/29/23   Abdul Fine, MD  traZODone  (DESYREL ) 50 MG tablet Take 2 tablets (100 mg total) by mouth at bedtime. 03/21/24 03/16/25  Gil Greig BRAVO, NP  Anabia Weatherwax Petrolatum-Mineral Oil (LUBRICANT PM) OINT Apply 1 g to eye at bedtime.  09/29/23   Abdul Fine, MD    Family History Family History  Problem Relation Age of Onset   Breast cancer Maternal Aunt    Anuerysm Mother    Stroke Mother    Heart attack Father    Alzheimer's disease Father    Developmental delay Brother     Social History Social History   Tobacco Use   Smoking status: Never    Passive exposure: Past  Smokeless tobacco: Never  Vaping Use   Vaping status: Never Used  Substance Use Topics   Alcohol  use: Yes    Comment: very occasional once a week   Drug use: No     Allergies   Levofloxacin and Other   Review of Systems Review of Systems  Constitutional: Negative.   HENT:  Positive for congestion, ear pain, sinus pressure, sinus pain and sore throat. Negative for dental problem, drooling, ear discharge, facial swelling, hearing loss, mouth sores, nosebleeds, postnasal drip, rhinorrhea, sneezing, tinnitus, trouble swallowing and voice change.   Respiratory:  Positive for cough. Negative for apnea, choking, chest tightness, shortness of breath, wheezing and stridor.   Neurological:  Positive for headaches. Negative for dizziness, tremors, seizures, syncope, facial asymmetry, speech difficulty, weakness, light-headedness and numbness.     Physical Exam Triage Vital Signs ED Triage Vitals  Encounter Vitals Group     BP 03/27/24 1653 130/80     Girls Systolic BP Percentile --      Girls Diastolic BP Percentile --      Boys Systolic BP Percentile --      Boys Diastolic BP Percentile --      Pulse Rate 03/27/24 1653 78     Resp 03/27/24 1653 18     Temp 03/27/24 1653 97.8 F (36.6 C)     Temp Source 03/27/24 1653 Oral     SpO2 03/27/24 1651 97 %     Weight --      Height --      Head Circumference --      Peak Flow --      Pain Score 03/27/24 1651 8     Pain Loc --      Pain Education --      Exclude from Growth Chart --    No data found.  Updated Vital Signs BP 130/80 (BP Location: Right Arm)   Pulse 78   Temp  97.8 F (36.6 C) (Oral)   Resp 18   SpO2 97%   Visual Acuity Right Eye Distance:   Left Eye Distance:   Bilateral Distance:    Right Eye Near:   Left Eye Near:    Bilateral Near:     Physical Exam Constitutional:      Appearance: Normal appearance.  HENT:     Head: Normocephalic.     Right Ear: Tympanic membrane, ear canal and external ear normal.     Left Ear: Tympanic membrane, ear canal and external ear normal.     Nose: Congestion present.     Right Sinus: Maxillary sinus tenderness and frontal sinus tenderness present.     Left Sinus: Maxillary sinus tenderness and frontal sinus tenderness present.     Mouth/Throat:     Pharynx: No oropharyngeal exudate or posterior oropharyngeal erythema.  Eyes:     Extraocular Movements: Extraocular movements intact.  Cardiovascular:     Rate and Rhythm: Normal rate and regular rhythm.     Pulses: Normal pulses.     Heart sounds: Normal heart sounds.  Pulmonary:     Effort: Pulmonary effort is normal.     Breath sounds: Normal breath sounds.  Musculoskeletal:     Cervical back: Normal range of motion.  Lymphadenopathy:     Cervical: Cervical adenopathy present.  Neurological:     Mental Status: She is alert and oriented to person, place, and time.      UC Treatments / Results  Labs (all labs ordered are listed, but  only abnormal results are displayed) Labs Reviewed - No data to display  EKG   Radiology MM Breast Surgical Specimen Result Date: 03/26/2024 CLINICAL DATA:  Status post [facility] localized LEFT breast lumpectomy. EXAM: SPECIMEN RADIOGRAPH OF THE  BREAST COMPARISON:  Previous exam(s). FINDINGS: Status post excision of the LEFT breast. The [facility] reflector and coil shaped clip are present within the specimen. IMPRESSION: Specimen radiograph of the LEFT breast. Electronically Signed   By: Norleen Croak M.D.   On: 03/26/2024 13:42   NM Sentinel Node Inj-No Rpt (Breast) Result Date: 03/26/2024 Lymphoseek  was injected by the Nuclear Medicine Technologist for sentinel lymph node localization.    Procedures Procedures (including critical care time)  Medications Ordered in UC Medications - No data to display  Initial Impression / Assessment and Plan / UC Course  I have reviewed the triage vital signs and the nursing notes.  Pertinent labs & imaging results that were available during my care of the patient were reviewed by me and considered in my medical decision making (see chart for details).  Acute nonrecurrent maxillary sinusitis  Patient is in no signs of distress nor toxic appearing.  Vital signs are stable.  Low suspicion for pneumonia, pneumothorax or bronchitis and therefore will defer imaging.  Viral testing deferred due to timeline, symptoms consistent with a sinus infection, present for 3 weeks, prescribed Augmentin  and prednisone .May use additional over-the-counter medications as needed for supportive care.  May follow-up with urgent care as needed if symptoms persist or worsen.   Final Clinical Impressions(s) / UC Diagnoses   Final diagnoses:  None   Discharge Instructions   None    ED Prescriptions   None    PDMP not reviewed this encounter.   Teresa Shelba SAUNDERS, NP 03/27/24 1743

## 2024-03-27 NOTE — ED Triage Notes (Signed)
 Patient reports sinus pressure with yellow nasal drainage x 3 weeks. Patient has had Tylenol  at 12:30 pm and Ibuprofen  at 10:00 am.

## 2024-03-29 ENCOUNTER — Other Ambulatory Visit: Payer: Self-pay | Admitting: Pathology

## 2024-03-29 LAB — SURGICAL PATHOLOGY

## 2024-04-01 ENCOUNTER — Encounter: Payer: Self-pay | Admitting: *Deleted

## 2024-04-01 NOTE — Progress Notes (Signed)
 Oncotype Dx order [id number] submitted online

## 2024-04-04 NOTE — Anesthesia Postprocedure Evaluation (Signed)
 Anesthesia Post Note  Patient: Andrea Noble  Procedure(s) Performed: BREAST LUMPECTOMY WITH RADIO FREQUENCY LOCALIZER (Left: Breast) BIOPSY, LYMPH NODE, SENTINEL, AXILLARY (Left: Breast)  Patient location during evaluation: PACU Anesthesia Type: General Level of consciousness: awake and alert Pain management: pain level controlled Vital Signs Assessment: post-procedure vital signs reviewed and stable Respiratory status: spontaneous breathing, nonlabored ventilation, respiratory function stable and patient connected to nasal cannula oxygen Cardiovascular status: blood pressure returned to baseline and stable Postop Assessment: no apparent nausea or vomiting Anesthetic complications: no   There were no known notable events for this encounter.   Last Vitals:  Vitals:   03/26/24 1349 03/26/24 1432  BP: (!) 162/75 112/62  Pulse: 80 84  Resp: 16   Temp: 36.7 C   SpO2: 95%     Last Pain:  Vitals:   03/26/24 1432  TempSrc:   PainSc: 2                  Prentice Murphy

## 2024-04-08 ENCOUNTER — Inpatient Hospital Stay

## 2024-04-08 NOTE — Progress Notes (Signed)
 Tumor Board Documentation  Andrea Noble was presented by Dr. Jacobo at our Tumor Board on 04/08/2024, which included representatives from medical oncology, radiation oncology, radiology, pathology, navigation, research.  Andrea Noble currently presents as a current patient with history of the following treatments: surgical intervention(s).  Additionally, we reviewed previous medical and familial history, history of present illness, and recent lab results along with all available histopathologic and imaging studies. The tumor board considered available treatment options and made the following recommendations: Hormonal therapy, Adjuvant radiation Oncotype Dx pending  The following procedures/referrals were also placed: No orders of the defined types were placed in this encounter.   Clinical Trial Status:     Staging used: AJCC Staging: T: T1b N: N0        National site-specific guidelines   were discussed with respect to the case.  Tumor board is a meeting of clinicians from various specialty areas who evaluate and discuss patients for whom a multidisciplinary approach is being considered. Final determinations in the plan of care are those of the provider(s). The responsibility for follow up of recommendations given during tumor board is that of the provider.   Today's extended care, comprehensive team conference, [name] was not present for the discussion and was not examined.   Multidisciplinary Tumor Board is a multidisciplinary case peer review process.  Decisions discussed in the Multidisciplinary Tumor Board reflect the opinions of the specialists present at the conference without having examined the patient.  Ultimately, treatment and diagnostic decisions rest with the primary provider(s) and the patient.

## 2024-04-15 ENCOUNTER — Encounter: Payer: Self-pay | Admitting: Oncology

## 2024-04-17 ENCOUNTER — Encounter: Payer: Self-pay | Admitting: Radiation Oncology

## 2024-04-17 ENCOUNTER — Inpatient Hospital Stay: Admitting: Occupational Therapy

## 2024-04-17 ENCOUNTER — Telehealth: Payer: Self-pay

## 2024-04-17 ENCOUNTER — Ambulatory Visit
Admission: RE | Admit: 2024-04-17 | Discharge: 2024-04-17 | Disposition: A | Discharge status: Home / Self Care | Source: Ambulatory Visit | Attending: Radiation Oncology | Admitting: Radiation Oncology

## 2024-04-17 ENCOUNTER — Encounter: Payer: Self-pay | Admitting: Occupational Therapy

## 2024-04-17 ENCOUNTER — Encounter: Payer: Self-pay | Admitting: Oncology

## 2024-04-17 ENCOUNTER — Inpatient Hospital Stay: Attending: Oncology | Admitting: Oncology

## 2024-04-17 VITALS — BP 142/69 | Temp 98.0°F | Resp 18 | Ht 61.5 in | Wt 146.0 lb

## 2024-04-17 VITALS — BP 142/69 | HR 98 | Temp 97.0°F | Resp 15 | Ht 61.5 in | Wt 146.0 lb

## 2024-04-17 DIAGNOSIS — E785 Hyperlipidemia, unspecified: Secondary | ICD-10-CM | POA: Diagnosis not present

## 2024-04-17 DIAGNOSIS — Z8571 Personal history of Hodgkin lymphoma: Secondary | ICD-10-CM | POA: Diagnosis not present

## 2024-04-17 DIAGNOSIS — Z1721 Progesterone receptor positive status: Secondary | ICD-10-CM | POA: Insufficient documentation

## 2024-04-17 DIAGNOSIS — E039 Hypothyroidism, unspecified: Secondary | ICD-10-CM | POA: Insufficient documentation

## 2024-04-17 DIAGNOSIS — C50512 Malignant neoplasm of lower-outer quadrant of left female breast: Secondary | ICD-10-CM | POA: Insufficient documentation

## 2024-04-17 DIAGNOSIS — K5900 Constipation, unspecified: Secondary | ICD-10-CM | POA: Insufficient documentation

## 2024-04-17 DIAGNOSIS — Z803 Family history of malignant neoplasm of breast: Secondary | ICD-10-CM | POA: Insufficient documentation

## 2024-04-17 DIAGNOSIS — Z923 Personal history of irradiation: Secondary | ICD-10-CM | POA: Diagnosis not present

## 2024-04-17 DIAGNOSIS — N63 Unspecified lump in unspecified breast: Secondary | ICD-10-CM | POA: Insufficient documentation

## 2024-04-17 DIAGNOSIS — I10 Essential (primary) hypertension: Secondary | ICD-10-CM | POA: Insufficient documentation

## 2024-04-17 DIAGNOSIS — L905 Scar conditions and fibrosis of skin: Secondary | ICD-10-CM | POA: Insufficient documentation

## 2024-04-17 DIAGNOSIS — Z85820 Personal history of malignant melanoma of skin: Secondary | ICD-10-CM | POA: Diagnosis not present

## 2024-04-17 DIAGNOSIS — Z170 Estrogen receptor positive status [ER+]: Secondary | ICD-10-CM | POA: Insufficient documentation

## 2024-04-17 DIAGNOSIS — Z7951 Long term (current) use of inhaled steroids: Secondary | ICD-10-CM | POA: Insufficient documentation

## 2024-04-17 DIAGNOSIS — C50412 Malignant neoplasm of upper-outer quadrant of left female breast: Secondary | ICD-10-CM | POA: Insufficient documentation

## 2024-04-17 DIAGNOSIS — Z79899 Other long term (current) drug therapy: Secondary | ICD-10-CM | POA: Insufficient documentation

## 2024-04-17 DIAGNOSIS — Z1732 Human epidermal growth factor receptor 2 negative status: Secondary | ICD-10-CM | POA: Insufficient documentation

## 2024-04-17 DIAGNOSIS — Z79890 Hormone replacement therapy (postmenopausal): Secondary | ICD-10-CM | POA: Insufficient documentation

## 2024-04-17 DIAGNOSIS — G4700 Insomnia, unspecified: Secondary | ICD-10-CM | POA: Insufficient documentation

## 2024-04-17 DIAGNOSIS — Z9889 Other specified postprocedural states: Secondary | ICD-10-CM | POA: Insufficient documentation

## 2024-04-17 DIAGNOSIS — Z7982 Long term (current) use of aspirin: Secondary | ICD-10-CM | POA: Insufficient documentation

## 2024-04-17 DIAGNOSIS — M129 Arthropathy, unspecified: Secondary | ICD-10-CM | POA: Insufficient documentation

## 2024-04-17 DIAGNOSIS — Z7952 Long term (current) use of systemic steroids: Secondary | ICD-10-CM | POA: Insufficient documentation

## 2024-04-17 DIAGNOSIS — I700 Atherosclerosis of aorta: Secondary | ICD-10-CM | POA: Insufficient documentation

## 2024-04-17 DIAGNOSIS — Z8572 Personal history of non-Hodgkin lymphomas: Secondary | ICD-10-CM | POA: Insufficient documentation

## 2024-04-17 DIAGNOSIS — C50912 Malignant neoplasm of unspecified site of left female breast: Secondary | ICD-10-CM | POA: Diagnosis not present

## 2024-04-17 DIAGNOSIS — Z9221 Personal history of antineoplastic chemotherapy: Secondary | ICD-10-CM | POA: Insufficient documentation

## 2024-04-17 DIAGNOSIS — M25612 Stiffness of left shoulder, not elsewhere classified: Secondary | ICD-10-CM | POA: Insufficient documentation

## 2024-04-17 DIAGNOSIS — K219 Gastro-esophageal reflux disease without esophagitis: Secondary | ICD-10-CM | POA: Insufficient documentation

## 2024-04-17 DIAGNOSIS — J45909 Unspecified asthma, uncomplicated: Secondary | ICD-10-CM | POA: Insufficient documentation

## 2024-04-17 NOTE — Progress Notes (Signed)
 [place] told patient still has a stitch were she had her surgery (left breast).   They were told that there is a change in the spot Dr. Camelia is going to change her treatment plan.   Patient was told that we have genetic testing here, so she was wanting her sister to have the genetic testing done, I told patient I would ask about it.   She is going to have right knee surgery coming up and the Doctor would like to discuss things with Dr. Jacobo.

## 2024-04-17 NOTE — Telephone Encounter (Signed)
 Called pharmacy to d/c melatonin also updated medication list. Left voicemail for patient letting her know of action taken.

## 2024-04-17 NOTE — Therapy (Signed)
 OUTPATIENT OCCUPATIONAL THERAPY BREAST CANCER POSTOP  EVALUATION   Patient Name: Andrea Noble MRN: [id number] DOB:25-Feb-1951, 73 y.o., female Today's Date: 04/17/2024  END OF SESSION:  OT End of Session - 04/17/24 0928     Visit Number 1    Number of Visits 6    Date for Recertification  06/12/24    OT Start Time 0830    OT Stop Time 0905    OT Time Calculation (min) 35 min    Activity Tolerance Patient tolerated treatment well    Behavior During Therapy WFL for tasks assessed/performed          Past Medical History:  Diagnosis Date   ADHD (attention deficit hyperactivity disorder)    Anxiety    Arthritis    Asthma    Atherosclerosis of aorta    Cataract    Chronic constipation    Chronic insomnia    Complication of anesthesia    delayed emergence x1   Depression    Diverticulitis    GAD (generalized anxiety disorder)    GERD (gastroesophageal reflux disease)    Hodgkin's lymphoma (HCC) 2006   underwent radiation and chemo, last CT scan since moving to  from [place] 2018   Hyperlipidemia    Hypertension    Hypothyroidism    IBS (irritable bowel syndrome)    Invasive ductal carcinoma of breast, female, left (HCC)    Lumbar radiculopathy    Melanoma (HCC)    Menopausal syndrome    OAB (overactive bladder)    OSA on CPAP    Palpitations    Paresthesia    Pneumonia    PSVT (paroxysmal supraventricular tachycardia)    Seasonal allergies    Past Surgical History:  Procedure Laterality Date   AXILLARY SENTINEL NODE BIOPSY Left 03/26/2024   Procedure: BIOPSY, LYMPH NODE, SENTINEL, AXILLARY;  Surgeon: Tye Millet, DO;  Location: [facility];  Service: General;  Laterality: Left;   BACK SURGERY     basal cell carcinoma surgery     BLEPHAROPLASTY Bilateral    BREAST BIOPSY Right    benign   BREAST BIOPSY  07/27/2018   us  core bx venus COLUMNAR CELL METAPLASIA, AND PSEUDOANGIOMATOUS STROMAL   BREAST BIOPSY Left 03/07/2024   US  LT BREAST BX W LOC DEV 1ST LESION  IMG BX SPEC US  GUIDE 03/07/2024 [facility]   BREAST BIOPSY Left 03/20/2024   US  LT BREAST SAVI/RF TAG 1ST LESION US  GUIDE 03/20/2024 [facility]   BREAST LUMPECTOMY WITH RADIO FREQUENCY LOCALIZER Left 03/26/2024   Procedure: BREAST LUMPECTOMY WITH RADIO FREQUENCY LOCALIZER;  Surgeon: Tye Millet, DO;  Location: [facility];  Service: General;  Laterality: Left;  with radiofrequency tag   CARPAL TUNNEL RELEASE Right    CARPAL TUNNEL RELEASE Left    CATARACT EXTRACTION Bilateral 2015   CLEFT LIP REPAIR     COLONOSCOPY N/A 11/23/2023   Procedure: COLONOSCOPY;  Surgeon: Onita Elspeth Sharper, DO;  Location: [facility];  Service: Gastroenterology;  Laterality: N/A;  DM   COLONOSCOPY WITH PROPOFOL  N/A 07/09/2018   Procedure: COLONOSCOPY WITH PROPOFOL ;  Surgeon: Gaylyn Gladis PENNER, MD;  Location: [facility];  Service: Endoscopy;  Laterality: N/A;   DE QUERVAIN'S RELEASE Bilateral    DIAGNOSTIC LAPAROSCOPY     endometriosis   ESOPHAGOGASTRODUODENOSCOPY N/A 07/09/2018   Procedure: ESOPHAGOGASTRODUODENOSCOPY (EGD);  Surgeon: Gaylyn Gladis PENNER, MD;  Location: [facility];  Service: Endoscopy;  Laterality: N/A;   ESOPHAGOGASTRODUODENOSCOPY N/A 11/23/2023   Procedure: EGD (ESOPHAGOGASTRODUODENOSCOPY);  Surgeon: Onita Elspeth Sharper, DO;  Location: [facility];  Service: Gastroenterology;  Laterality: N/A;   FINGER SURGERY Right    Middle finger joint replaced   JOINT REPLACEMENT     MAXIMUM ACCESS (MAS) TRANSFORAMINAL LUMBAR INTERBODY FUSION (TLIF) 2 LEVEL Right 07/06/2020   Procedure: L4/5, L5/S1 TRANSFORAMINAL LUMBAR INTERBODY FUSION (TLIF), L4-S1 PEDICLE SCREWS,DECOMPRESSION ;  Surgeon: Bluford Standing, MD;  Location: [facility];  Service: Neurosurgery;  Laterality: Right;   NASAL SINUS SURGERY     POLYPECTOMY  11/23/2023   Procedure: POLYPECTOMY, INTESTINE;  Surgeon: Onita Standing Sharper, DO;  Location: [facility];  Service: Gastroenterology;;   THUMB SURGERY Right    took  tendon from forearm and wrapped it around the thumb joint   Patient Active Problem List   Diagnosis Date Noted   Irritable bowel syndrome with predominant constipation 03/20/2024   MCI (mild cognitive impairment) with memory loss 03/20/2024   Invasive ductal carcinoma of left breast (HCC) 03/15/2024   Low back pain 10/25/2023   Diabetes due to undrl condition w oth diabetic neuro comp (HCC) 09/16/2023   OSA (obstructive sleep apnea) 01/20/2023   Arthralgia of right knee 11/14/2022   Arthritis of right hip 11/14/2022   Arthritis of right knee 11/14/2022   Pain of right hip joint 11/14/2022   Acquired hypothyroidism 02/04/2022   Essential hypertension 03/24/2021   Preventative health care 02/03/2021   Mood disorder 02/03/2021   Atherosclerosis of aorta 02/03/2021   Intermittent asthma, well controlled 07/31/2020   Menopausal syndrome    OAB (overactive bladder)    Seasonal allergies    Lumbar radiculopathy 07/06/2020   Chronic shoulder bursitis, right 10/14/2019   Primary osteoarthritis of both hands 04/30/2018   Postmenopausal 04/30/2018   Arthralgia 12/12/2017   GAD (generalized anxiety disorder) 12/12/2017   Chronic constipation 11/13/2017   Asthma, stable, moderate persistent 05/15/2017   Chronic insomnia 05/15/2017   Hot flashes, menopausal 05/15/2017   Mixed hyperlipidemia 03/14/2017   Gastroesophageal reflux disease without esophagitis 03/14/2017   MDD (major depressive disorder), recurrent episode, moderate (HCC) 03/14/2017   History of Hodgkin's lymphoma 03/01/2017    PCP: Dr Laurence  REFERRING PROVIDER: Dr Jacobo  REFERRING DIAG: L lumpectomy  THERAPY DIAG:  Swelling of breast  S/P breast lumpectomy  Scar condition and fibrosis of skin  Stiffness of left shoulder, not elsewhere classified  Rationale for Evaluation and Treatment: Rehabilitation  ONSET DATE: 03/26/24  SUBJECTIVE:                                                                                                                                                                                            SUBJECTIVE STATEMENT: I  had my lumpectomy 03/26/2024.  By Dr. Tye.  I think I have mastitis in my breast.  It is really swollen.  I do not have a follow-up appointment with Dr. Tye.  It is tender.  But my motion is good to my arm.  PERTINENT HISTORY:  Patient had left lumpectomy on 03/26/2024 by Dr. Tye.  Meeting with Dr. Camelia today about radiation.  And also seeing Dr. Jacobo.  Patient report increased swelling in the left breast thinks is mastitis.  PATIENT GOALS:   reduce lymphedema risk and learn post op HEP.   PAIN:  Are you having pain? 2-4/10 tenderness left breast  PRECAUTIONS: Active CA , left UE lymphedema breast     HAND DOMINANCE: right  WEIGHT BEARING RESTRICTIONS: No  FALLS:  Has patient fallen in last 6 months? No  LIVING ENVIRONMENT: Patient lives with: Spouse at [facility] and LEISURE: Patient is over trying hospital chaplain and pastor-she likes to read and done in the past physical therapy multiple times over [facility].  Patient uses a rollator to walk   OBJECTIVE:  COGNITION: Overall cognitive status: Within functional limits for tasks assessed    POSTURE:  Rounded shoulders posture  UPPER EXTREMITY AROM/PROM:  Active range of motion in bilateral shoulders within functional limits.  Do report some pain in multiple joints in the areas of the body.  CERVICAL AROM: All within normal limits:     UPPER EXTREMITY STRENGTH: 4+/5 for bilateral shoulders  LYMPHEDEMA ASSESSMENTS:    LYMPHEDEMA/ONCOLOGY QUESTIONNAIRE - 04/17/24 0001       Right Upper Extremity Lymphedema   15 cm Proximal to Olecranon Process 28 cm    10 cm Proximal to Olecranon Process 26.5 cm    Olecranon Process 23.5 cm      Left Upper Extremity Lymphedema   15 cm Proximal to Olecranon Process 28.5 cm    10 cm Proximal to Olecranon Process 25 cm     Olecranon Process 23 cm           L-DEX LYMPHEDEMA SCREENING:  The patient was assessed using the L-Dex machine today to produce a lymphedema index baseline score. The patient will be reassessed on a regular basis (typically every 3 months) to obtain new L-Dex scores. If the score is > 6.5 points away from his/her baseline score indicating onset of subclinical lymphedema, it will be recommended to wear a compression garment for 4 weeks, 12 hours per day and then be reassessed. If the score continues to be > 6.5 points from baseline at reassessment, we will initiate lymphedema treatment. Assessing in this manner has a 95% rate of preventing clinically significant lymphedema.   L-DEX FLOWSHEETS - 04/17/24 0900       L-DEX LYMPHEDEMA SCREENING   Measurement Type Unilateral    L-DEX MEASUREMENT EXTREMITY Upper Extremity    POSITION  Standing    DOMINANT SIDE Right    At Risk Side Left    BASELINE SCORE (UNILATERAL) 13.1        Patient L-Dex score is increased per patient with increased swelling in the left breast.?  Mastitis.  Continue to monitor.    PATIENT EDUCATION:  Education details: Lymphedema risk reduction and post op shoulder/posture HEP Person educated: Patient Education method: Programmer, Multimedia, Demonstration, Handout Education comprehension: Patient verbalized understanding and returned demonstration  HOME EXERCISE PROGRAM: Reviewed with patient and husband to continue to do daily 2 times a day active assistive range of motion using a cane or a  want for shoulder flexion and horizontal abduction in supine.  10-12 reps pain-free As well as external rotation in supine using gravity to practice and assess if possible and can tolerate radiation position. Patient this date able to tolerate radiation position. Encourage patient to continue with his home exercises daily to maintain her motion. Patient was educated and handout reviewed on lymphedema signs and symptoms as well as  prevention precautions.  Patient has increased edema in the left breast as well as a stitch still in the incision.  Sent a message to Dr. Jerone team.  ASSESSMENT:  CLINICAL IMPRESSION: Patient presented OT evaluation with a diagnosis of left lumpectomy on 03/26/2024 by Dr. Tye.  Bilateral shoulder active range of motion is within functional limits.  Patient denies any pain in the shoulder or limitations.  Patient appeared to have some scar adhesion in the axilla.  Patient has a stitch left in the incision on the breast close to the nipple.  As well as increase edema in the left breast with tenderness to touch 4/10 pain.  Denies a fever.  Patient had appointment with Dr. Camelia and Dr. Jacobo after OT session. - Did send a message to Dr. Jacobo.  Patient to continue with home program and follow-up with me in 2 to 3 weeks.  She will benefit from a continued OT services and from L-Dex screens every 3 months for 2 years to detect subclinical lymphedema.  Pt will benefit from skilled therapeutic intervention to improve on the following deficits: Decreased knowledge of precautions and lymphedema education, impaired UE functional use, pain, decreased ROM, postural dysfunction.   OT treatment/interventions: ADL/self-care home management, pt/family education, therapeutic exercise,manual therapy  REHAB POTENTIAL: Good  CLINICAL DECISION MAKING: Stable/uncomplicated  EVALUATION COMPLEXITY: Low   GOALS: Goals reviewed with patient? YES  LONG TERM GOALS: (STG=LTG)    Name Target Date Goal status  1 Pt will be able to verbalize understanding of pertinent lymphedema risk reduction practices relevant to her dx specifically related to skin care.  Baseline:  No knowledge 8 weeks Initial  2 Pt will be able to return demo and/or verbalize understanding of the post op HEP related to regaining shoulder ROM. Baseline:  No knowledge Today Achieved at eval  3.  Patient to be independent in the home  program to decrease swelling and tenderness in the left  Breast BASELINE: Tenderness 2-4/10 of the left breast.  Increase swelling and tenderness.  Patient has a stitch left in the incision close to the nipple.  Patient was educated in scar massage.  At the scar and there is a lot. 8 weeks Initial  4 Pt will demo she has regained full shoulder ROM and function post operatively compared to baselines.  Baseline: See objective measurements taken today. 8 weeks Initial    PLAN:  OT FREQUENCY/DURATION: EVAL and 5 sessions  PLAN FOR NEXT SESSION:   Occupational Therapy Information for After Breast Cancer Surgery/Treatment:  Lymphedema is a swelling condition that you may be at risk for in your arm if you have lymph nodes removed from the armpit area.  After a sentinel node biopsy, the risk is approximately 5-9% and is higher after an axillary node dissection.  There is treatment available for this condition and it is not life-threatening.  Contact your physician or occupational therapist with concerns. You may begin the 4 shoulder/posture exercises (see additional sheet) when permitted by your physician (typically a week after surgery).  If you have drains, you may need to wait  until those are removed before beginning range of motion exercises.  A general recommendation is to not lift your arms above shoulder height until drains are removed.  These exercises should be done to your tolerance and gently.  This is not a no pain/no gain type of recovery so listen to your body and stretch into the range of motion that you can tolerate, stopping if you have pain.  If you are having immediate reconstruction, ask your plastic surgeon about doing exercises as he or she may want you to wait. .  While undergoing any medical procedure or treatment, try to avoid blood pressure being taken or needle sticks from occurring on the arm on the side of cancer.   This recommendation begins after surgery and continues for  the rest of your life.  This may help reduce your risk of getting lymphedema (swelling in your arm). An excellent resource for those seeking information on lymphedema is the National Lymphedema Network's web site. It can be accessed at www.lymphnet.org If you notice swelling in your hand, arm or breast at any time following surgery (even if it is many years from now), please contact your doctor or occupational therapist to discuss this.  Lymphedema can be treated at any time but it is easier for you if it is treated early on.  If you feel like your shoulder motion is not returning to normal in a reasonable amount of time, please contact your surgeon or occupational therapist.  [facility] [phone]. [address], [place], [place] [address]  Patient was instructed today in a home exercise program today for post op shoulder range of motion. These included active assist shoulder flexion in standing/supine, scapular retraction, wall walking/slides with shoulder abduction, and hands behind head external rotation in supine.  She was encouraged to do these 2-3 x day, holding 3 seconds and repeating 10 times when permitted by her physician/surgeon      Ancel Peters, OTR/L,CLT 04/17/2024, 5:39 PM

## 2024-04-17 NOTE — Progress Notes (Signed)
 [facility]  [phone] [phone]  ID: Kelsee Preslar OB: 07/22/1950  MR#: [id number]  RDW#:[id number]  Patient Care Team: Laurence Locus, DO as PCP - General (Internal Medicine) Laurice Francis NOVAK, OD (Optometry) Georgina Shasta POUR, RN as Oncology Nurse Navigator Jacobo, Evalene PARAS, MD as Consulting Physician (Oncology) Lenn Aran, MD as Consulting Physician (Radiation Oncology)  CHIEF COMPLAINT: Pathologic stage Ia ER/PR positive, HER2 negative invasive lobular carcinoma of the left breast.  Oncotype DX score low risk 20.  INTERVAL HISTORY: Patient returns to clinic today for further evaluation, discussion of her final pathology results, and treatment planning.  She underwent lumpectomy on March 26, 2024 and tolerated the procedure well.  She currently feels well and is asymptomatic.  She has no neurologic complaints.  She denies any recent fevers or illnesses.  She has a good appetite and denies weight loss.  She has no chest pain, shortness of breath, cough, or hemoptysis.  She denies any nausea, vomiting, constipation, or diarrhea.  She has no urinary complaints.  Patient offers no specific complaints today.  REVIEW OF SYSTEMS:   Review of Systems  Constitutional: Negative.  Negative for fever, malaise/fatigue and weight loss.  Respiratory: Negative.  Negative for cough, hemoptysis and shortness of breath.   Cardiovascular: Negative.  Negative for chest pain and leg swelling.  Gastrointestinal: Negative.  Negative for abdominal pain.  Genitourinary: Negative.  Negative for dysuria.  Musculoskeletal: Negative.  Negative for back pain.  Skin: Negative.  Negative for rash.  Neurological: Negative.  Negative for dizziness, focal weakness, weakness and headaches.  Psychiatric/Behavioral: Negative.  The patient is not nervous/anxious.     As per HPI. Otherwise, a complete review of systems is negative.  PAST MEDICAL HISTORY: Past Medical History:   Diagnosis Date   ADHD (attention deficit hyperactivity disorder)    Anxiety    Arthritis    Asthma    Atherosclerosis of aorta    Cataract    Chronic constipation    Chronic insomnia    Complication of anesthesia    delayed emergence x1   Depression    Diverticulitis    GAD (generalized anxiety disorder)    GERD (gastroesophageal reflux disease)    Hodgkin's lymphoma (HCC) 2006   underwent radiation and chemo, last CT scan since moving to [place] from [place] 2018   Hyperlipidemia    Hypertension    Hypothyroidism    IBS (irritable bowel syndrome)    Invasive ductal carcinoma of breast, female, left (HCC)    Lumbar radiculopathy    Melanoma (HCC)    Menopausal syndrome    OAB (overactive bladder)    OSA on CPAP    Palpitations    Paresthesia    Pneumonia    PSVT (paroxysmal supraventricular tachycardia)    Seasonal allergies     PAST SURGICAL HISTORY: Past Surgical History:  Procedure Laterality Date   AXILLARY SENTINEL NODE BIOPSY Left 03/26/2024   Procedure: BIOPSY, LYMPH NODE, SENTINEL, AXILLARY;  Surgeon: Tye Millet, DO;  Location: [facility] ORS;  Service: General;  Laterality: Left;   BACK SURGERY     basal cell carcinoma surgery     BLEPHAROPLASTY Bilateral    BREAST BIOPSY Right    benign   BREAST BIOPSY  07/27/2018   us  core bx venus COLUMNAR CELL METAPLASIA, AND PSEUDOANGIOMATOUS STROMAL   BREAST BIOPSY Left 03/07/2024   US  LT BREAST BX W LOC DEV 1ST LESION IMG BX SPEC US  GUIDE 03/07/2024 [facility]   BREAST BIOPSY  Left 03/20/2024   US  LT BREAST SAVI/RF TAG 1ST LESION US  GUIDE 03/20/2024 [facility]   BREAST LUMPECTOMY WITH RADIO FREQUENCY LOCALIZER Left 03/26/2024   Procedure: BREAST LUMPECTOMY WITH RADIO FREQUENCY LOCALIZER;  Surgeon: Tye Millet, DO;  Location: [facility];  Service: General;  Laterality: Left;  with radiofrequency tag   CARPAL TUNNEL RELEASE Right    CARPAL TUNNEL RELEASE Left    CATARACT EXTRACTION Bilateral 2015   CLEFT LIP REPAIR      COLONOSCOPY N/A 11/23/2023   Procedure: COLONOSCOPY;  Surgeon: Onita Elspeth Sharper, DO;  Location: [facility] ENDOSCOPY;  Service: Gastroenterology;  Laterality: N/A;  DM   COLONOSCOPY WITH PROPOFOL  N/A 07/09/2018   Procedure: COLONOSCOPY WITH PROPOFOL ;  Surgeon: Gaylyn Gladis PENNER, MD;  Location: [facility] ENDOSCOPY;  Service: Endoscopy;  Laterality: N/A;   DE QUERVAIN'S RELEASE Bilateral    DIAGNOSTIC LAPAROSCOPY     endometriosis   ESOPHAGOGASTRODUODENOSCOPY N/A 07/09/2018   Procedure: ESOPHAGOGASTRODUODENOSCOPY (EGD);  Surgeon: Gaylyn Gladis PENNER, MD;  Location[facility] ENDOSCOPY;  Service: Endoscopy;  Laterality: N/A;   ESOPHAGOGASTRODUODENOSCOPY N/A 11/23/2023   Procedure: EGD (ESOPHAGOGASTRODUODENOSCOPY);  Surgeon: Onita Elspeth Sharper, DO;  Location: [facility] ENDOSCOPY;  Service: Gastroenterology;  Laterality: N/A;   FINGER SURGERY Right    Middle finger joint replaced   JOINT REPLACEMENT     MAXIMUM ACCESS (MAS) TRANSFORAMINAL LUMBAR INTERBODY FUSION (TLIF) 2 LEVEL Right 07/06/2020   Procedure: L4/5, L5/S1 TRANSFORAMINAL LUMBAR INTERBODY FUSION (TLIF), L4-S1 PEDICLE SCREWS,DECOMPRESSION ;  Surgeon: Bluford Elspeth, MD;  Location: [facility];  Service: Neurosurgery;  Laterality: Right;   NASAL SINUS SURGERY     POLYPECTOMY  11/23/2023   Procedure: POLYPECTOMY, INTESTINE;  Surgeon: Onita Elspeth Sharper, DO;  Location: [facility] ENDOSCOPY;  Service: Gastroenterology;;   THUMB SURGERY Right    took tendon from forearm and wrapped it around the thumb joint    FAMILY HISTORY: Family History  Problem Relation Age of Onset   Breast cancer Maternal Aunt    Anuerysm Mother    Stroke Mother    Heart attack Father    Alzheimer's disease Father    Developmental delay Brother     ADVANCED DIRECTIVES (Y/N):  N  HEALTH MAINTENANCE: Social History   Tobacco Use   Smoking status: Never    Passive exposure: Past   Smokeless tobacco: Never  Vaping Use   Vaping status: Never Used  Substance Use Topics    Alcohol  use: Yes    Comment: very occasional once a week   Drug use: No     Colonoscopy:  PAP:  Bone density:  Lipid panel:  Allergies  Allergen Reactions   Levofloxacin     IV Levaquin causes burning   Other Itching    Surgical glue    Current Outpatient Medications  Medication Sig Dispense Refill   acetaminophen  (TYLENOL ) 650 MG CR tablet Take 1 tablet (650 mg total) by mouth every 8 (eight) hours as needed for pain. 30 tablet 0   albuterol  (VENTOLIN  HFA) 108 (90 Base) MCG/ACT inhaler Inhale 2 puffs into the lungs every 6 (six) hours as needed for wheezing or shortness of breath. 8 g 2   amoxicillin -clavulanate (AUGMENTIN ) 875-125 MG tablet Take 1 tablet by mouth every 12 (twelve) hours. 14 tablet 0   aspirin  EC 81 MG tablet Take 1 tablet (81 mg total) by mouth daily. Swallow whole. 90 tablet 1   azelastine  (ASTELIN ) 0.1 % nasal spray USE 2 SPRAYS IN EACH NOSTRIL TWICE A DAY AS DIRECTED 90 mL 3  buPROPion  (WELLBUTRIN  SR) 200 MG 12 hr tablet Take 200 mg by mouth 2 (two) times daily.     busPIRone  (BUSPAR ) 15 MG tablet Take 1 tablet (15 mg total) by mouth 2 (two) times daily. 180 tablet 3   Calcium  Carbonate-Vit D-Min (CALTRATE 600+D PLUS MINIS PO) Take 2 tablets by mouth every morning.     diltiazem  (CARDIZEM  CD) 240 MG 24 hr capsule Take 240 mg by mouth daily with lunch.     DULoxetine  HCl 40 MG CPEP Take 40 mg by mouth every morning.     fluticasone -salmeterol (WIXELA INHUB) 100-50 MCG/ACT AEPB Inhale 1 puff into the lungs 2 (two) times daily. 180 each 3   gabapentin  (NEURONTIN ) 100 MG capsule Take 1 capsule (100 mg total) by mouth daily as needed.     Homeopathic Products (ARNICARE EX) Apply 1 Application topically daily.     ibuprofen  (ADVIL ) 800 MG tablet Take 1 tablet (800 mg total) by mouth every 8 (eight) hours as needed for mild pain (pain score 1-3) or moderate pain (pain score 4-6). 30 tablet 0   levothyroxine  (SYNTHROID ) 50 MCG tablet Take 1-1.5 tablets (50-75 mcg  total) by mouth See admin instructions. Take 75 mg every  Monday, Wednesday and Friday Take 50 mg all the other days 90 tablet 3   linaclotide (LINZESS) 290 MCG CAPS capsule Take 290 mcg by mouth daily before breakfast.     liothyronine  (CYTOMEL ) 5 MCG tablet Take 0.5 tablets (2.5 mcg total) by mouth daily. (Patient taking differently: Take 2.5 mcg by mouth every morning.) 45 tablet 3   montelukast  (SINGULAIR ) 10 MG tablet Take 1 tablet (10 mg total) by mouth at bedtime. (Patient taking differently: Take 5 mg by mouth at bedtime.) 90 tablet 3   Multiple Vitamin (MULTIVITAMIN) tablet Take 1 tablet by mouth daily. Vita Fusion adult Gummie 90 tablet 3   oxyCODONE -acetaminophen  (PERCOCET) 5-325 MG tablet Take 1 tablet by mouth every 8 (eight) hours as needed for severe pain (pain score 7-10). 6 tablet 0   pantoprazole  (PROTONIX ) 40 MG tablet Take 1 tablet (40 mg total) by mouth every morning. 30 tablet 0   Peppermint Oil (IBGARD PO) Take 1 capsule by mouth 2 (two) times daily with a meal.     Polyethyl Glycol-Propyl Glycol (SYSTANE) 0.4-0.3 % GEL ophthalmic gel Place 1 application  into both eyes at bedtime.     polyethylene glycol powder (GLYCOLAX /MIRALAX ) 17 GM/SCOOP powder Take 17 g by mouth daily. 255 g 3   pravastatin  (PRAVACHOL ) 40 MG tablet Take 1 tablet (40 mg total) by mouth daily. (Patient taking differently: Take 40 mg by mouth at bedtime.) 180 tablet 3   predniSONE  (STERAPRED UNI-PAK 21 TAB) 10 MG (21) TBPK tablet Take by mouth daily. Take 6 tabs by mouth daily  for 1 days, then 5 tabs for 1 days, then 4 tabs for 1 days, then 3 tabs for 1 days, 2 tabs for 1 days, then 1 tab by mouth daily for 1 days 21 tablet 0   traZODone  (DESYREL ) 50 MG tablet Take 2 tablets (100 mg total) by mouth at bedtime. 180 tablet 3   White Petrolatum-Mineral Oil (LUBRICANT PM) OINT Apply 1 g to eye at bedtime.     No current facility-administered medications for this visit.    OBJECTIVE: Vitals:   04/17/24 1110   BP: (!) 142/69  Resp: 18  Temp: 98 F (36.7 C)     Body mass index is 27.14 kg/m.    ECOG  FS:0 - Asymptomatic  General: Well-developed, well-nourished, no acute distress. Eyes: Pink conjunctiva, anicteric sclera. HEENT: Normocephalic, moist mucous membranes. Lungs: No audible wheezing or coughing. Heart: Regular rate and rhythm. Abdomen: Soft, nontender, no obvious distention. Musculoskeletal: No edema, cyanosis, or clubbing. Neuro: Alert, answering all questions appropriately. Cranial nerves grossly intact. Skin: No rashes or petechiae noted. Psych: Normal affect.  LAB RESULTS:  Lab Results  Component Value Date   NA 137 02/01/2024   K 4.6 02/01/2024   CL 101 02/01/2024   CO2 28 02/01/2024   GLUCOSE 92 02/01/2024   BUN 12 02/01/2024   CREATININE 0.69 02/01/2024   CALCIUM  9.6 02/01/2024   PROT 6.9 11/24/2023   ALBUMIN 3.8 11/24/2023   AST 28 11/24/2023   ALT 24 11/24/2023   ALKPHOS 118 11/24/2023   BILITOT 0.5 11/24/2023   GFRNONAA 58 (L) 12/17/2023   GFRAA >60 06/23/2018    Lab Results  Component Value Date   WBC 5.8 12/17/2023   NEUTROABS 4.4 03/06/2017   HGB 11.8 (L) 12/17/2023   HCT 36.4 12/17/2023   MCV 93.6 12/17/2023   PLT 320 12/17/2023     STUDIES: MM Breast Surgical Specimen Result Date: 03/26/2024 CLINICAL DATA:  Status post Savi Scout localized LEFT breast lumpectomy. EXAM: SPECIMEN RADIOGRAPH OF THE  BREAST COMPARISON:  Previous exam(s). FINDINGS: Status post excision of the LEFT breast. The [facility] reflector and coil shaped clip are present within the specimen. IMPRESSION: Specimen radiograph of the LEFT breast. Electronically Signed   By: Norleen Croak M.D.   On: 03/26/2024 13:42   NM Sentinel Node Inj-No Rpt (Breast) Result Date: 03/26/2024 Lymphoseek was injected by the Nuclear Medicine Technologist for sentinel lymph node localization.   US  LT BREAST SAVI/RF TAG 1ST LESION US  GUIDE Result Date: 03/20/2024 CLINICAL DATA:  Status post  ultrasound-guided biopsy of a LEFT breast mass at demonstrating invasive mammary carcinoma (COIL). Patient presents for SAVI scout guided localization EXAM: NEEDLE LOCALIZATION OF THE LEFT BREAST WITH ULTRASOUND GUIDANCE COMPARISON:  Previous exam(s). FINDINGS: Patient presents for needle localization prior to lumpectomy. I met with the patient and we discussed the procedure of needle localization including benefits and alternatives. We discussed the high likelihood of a successful procedure. We discussed the risks of the procedure, including infection, bleeding, tissue injury, and further surgery. Informed, written consent was given. The usual time-out protocol was performed immediately prior to the procedure. Using ultrasound guidance, sterile technique, 1% lidocaine  and a 7.5 cm SAVI SCOUT needle, the mass at 3 o'clock 5 cm from nipple was localized using a lateral approach. Subsequent two-view mammogram was performed. The images were marked for Dr. Tye. IMPRESSION: Radar reflector localization of the LEFT breast. No apparent complications. Electronically Signed   By: Corean Salter M.D.   On: 03/20/2024 09:38   MM 3D DIAGNOSTIC MAMMOGRAM UNILATERAL LEFT BREAST Result Date: 03/20/2024 CLINICAL DATA:  Patient is status post ultrasound-guided biopsy of a LEFT breast mass which demonstrated invasive mammary carcinoma (COIL clip) EXAM: DIAGNOSTIC LEFT MAMMOGRAM POST PLACEMENT OF SAVI SCOUT COMPARISON:  Previous exam(s). ACR Breast Density Category c: The breasts are heterogeneously dense, which may obscure small masses. FINDINGS: Mammographic images were obtained following ultrasound guided placement of SAVI scout in the LEFT breast. These demonstrate SAVI scout in association with an irregular mass and COIL biopsy marking clip in the outer breast at middle depth. IMPRESSION: Appropriate position of SAVI scout within biopsy-proven LEFT breast malignancy. Final Assessment: Post Procedure Mammograms for [facility] placement Electronically  Signed   By: Corean Salter M.D.   On: 03/20/2024 09:35    ASSESSMENT: Pathologic stage Ia ER/PR positive, HER2 negative invasive lobular carcinoma of the left breast.  Oncotype DX score low risk 20  PLAN:    Pathologic stage Ia ER/PR positive, HER2 negative invasive lobular carcinoma of the left breast.  Oncotype DX score low risk 20: Patient underwent lumpectomy on March 26, 2024 revealed the above-stated malignancy.  Given the low risk Oncotype score, she does not require adjuvant chemotherapy.  Patient had consultation with radiation oncology today and will benefit from adjuvant XRT. Finally, given the ER/PR status of her malignancy she will benefit from letrozole for a total of 5 years at the completion of XRT.  No further intervention is needed.  Return to clinic at the end of her XRT for further evaluation and initiation of letrozole. Bone health: Will get a baseline bone mineral density when the patient initiates letrozole.  I spent a total of 30 minutes reviewing chart data, face-to-face evaluation with the patient, counseling and coordination of care as detailed above.   Patient expressed understanding and was in agreement with this plan. She also understands that She can call clinic at any time with any questions, concerns, or complaints.    Cancer Staging  Invasive ductal carcinoma of left breast [facility]) Staging form: Breast, AJCC 8th Edition - Clinical stage from 03/15/2024: Stage IA (cT1b, cN0, cM0, G2, ER+, PR+, HER2-) - Signed by Jacobo Evalene PARAS, MD on 03/15/2024 Stage prefix: Initial diagnosis Histologic grading system: 3 grade system   Evalene PARAS Jacobo, MD   04/17/2024 11:43 AM

## 2024-04-17 NOTE — Telephone Encounter (Signed)
 Copied from CRM #[id number]. Topic: Clinical - Prescription Issue >> Apr 16, 2024  5:17 PM Chiquita SQUIBB wrote: Reason for CRM: Patient is calling in requesting that Dr. Laurence places a stop order for the Melatonin 10 MG TABS, as the patient now takes traZODone  (DESYREL ) 50 MG tablet  and her pharmacy [facility] - [place], [place] - [address]) continues to send her the Melatonin and charges her for it.

## 2024-04-17 NOTE — Consult Note (Signed)
 NEW PATIENT EVALUATION  Name: Andrea Noble  MRN: [id number]  Date:   04/17/2024     DOB: 08-24-1950   This 73 y.o. female patient presents to the clinic for initial evaluation of pathologic stage Ia (pT1b N0 M0) invasive lobular carcinoma of the left breast status post wide local excision and sentinel lymph node biopsy.  REFERRING PHYSICIAN: Gil Greig BRAVO, NP  CHIEF COMPLAINT:  Chief Complaint  Patient presents with   Breast Cancer    DIAGNOSIS: The encounter diagnosis was Malignant neoplasm of lower-outer quadrant of left female breast, unspecified estrogen receptor status (HCC).   PREVIOUS INVESTIGATIONS:  Mammogram and ultrasound reviewed Clinical notes reviewed Pathology reports reviewed  HPI: Patient is a 73 year old female who presents with a self discovered mass in her left breast.  Mammogram confirmed a highly suspicious mass measuring 1 cm at the 3 o'clock position with architectural distortion.  No left axillar lymphadenopathy seen on ultrasound.  Right breast was within normal limits.  She underwent ultrasound-guided biopsy showing invasive mammary carcinoma.  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  Margins were clear at 3 mm.  2 sentinel lymph nodes were examined negative for metastatic disease.  Tumor was ER/PR positive HER2/neu not overexpressed.  Postoperatively she is done fairly well while she does apparently have a seroma and some swelling of the left breast.  Oncotype DX was performed with a score of recurrence risk 20.  She is seen today for consideration of postoperative radiation therapy.  She is having problems with her right knee and states she will probably need a knee replacement in the future.  Patient does have a remote history of Hodgkin's disease reason rereceiving both chemotherapy as well as thoracic radiation therapy.  We will be trying to locate those records.  PLANNED TREATMENT REGIMEN: AP  BI  PAST MEDICAL HISTORY:  has a past medical history of ADHD (attention deficit hyperactivity disorder), Anxiety, Arthritis, Asthma, Atherosclerosis of aorta, Cataract, Chronic constipation, Chronic insomnia, Complication of anesthesia, Depression, Diverticulitis, GAD (generalized anxiety disorder), GERD (gastroesophageal reflux disease), Hodgkin's lymphoma (HCC) (2006), Hyperlipidemia, Hypertension, Hypothyroidism, IBS (irritable bowel syndrome), Invasive ductal carcinoma of breast, female, left (HCC), Lumbar radiculopathy, Melanoma (HCC), Menopausal syndrome, OAB (overactive bladder), OSA on CPAP, Palpitations, Paresthesia, Pneumonia, PSVT (paroxysmal supraventricular tachycardia), and Seasonal allergies.    PAST SURGICAL HISTORY:  Past Surgical History:  Procedure Laterality Date   AXILLARY SENTINEL NODE BIOPSY Left 03/26/2024   Procedure: BIOPSY, LYMPH NODE, SENTINEL, AXILLARY;  Surgeon: Tye Millet, DO;  Location: [facility];  Service: General;  Laterality: Left;   BACK SURGERY     basal cell carcinoma surgery     BLEPHAROPLASTY Bilateral    BREAST BIOPSY Right    benign   BREAST BIOPSY  07/27/2018   us  core bx venus COLUMNAR CELL METAPLASIA, AND PSEUDOANGIOMATOUS STROMAL   BREAST BIOPSY Left 03/07/2024   US  LT BREAST BX W LOC DEV 1ST LESION IMG BX SPEC US  GUIDE 03/07/2024 [facility]   BREAST BIOPSY Left 03/20/2024   US  LT BREAST SAVI/RF TAG 1ST LESION US  GUIDE 03/20/2024 [facility]   BREAST LUMPECTOMY WITH RADIO FREQUENCY LOCALIZER Left 03/26/2024   Procedure: BREAST LUMPECTOMY WITH RADIO FREQUENCY LOCALIZER;  Surgeon: Tye Millet, DO;  Location: [facility];  Service: General;  Laterality: Left;  with radiofrequency tag   CARPAL TUNNEL RELEASE Right    CARPAL TUNNEL RELEASE Left    CATARACT EXTRACTION Bilateral 2015   CLEFT LIP REPAIR  COLONOSCOPY N/A 11/23/2023   Procedure: COLONOSCOPY;  Surgeon: Onita Elspeth Sharper, DO;  Location: [facility] ENDOSCOPY;  Service:  Gastroenterology;  Laterality: N/A;  DM   COLONOSCOPY WITH PROPOFOL  N/A 07/09/2018   Procedure: COLONOSCOPY WITH PROPOFOL ;  Surgeon: Gaylyn Gladis PENNER, MD;  Location: [facility] ENDOSCOPY;  Service: Endoscopy;  Laterality: N/A;   DE QUERVAIN'S RELEASE Bilateral    DIAGNOSTIC LAPAROSCOPY     endometriosis   ESOPHAGOGASTRODUODENOSCOPY N/A 07/09/2018   Procedure: ESOPHAGOGASTRODUODENOSCOPY (EGD);  Surgeon: Gaylyn Gladis PENNER, MD;  Location: [facility] ENDOSCOPY;  Service: Endoscopy;  Laterality: N/A;   ESOPHAGOGASTRODUODENOSCOPY N/A 11/23/2023   Procedure: EGD (ESOPHAGOGASTRODUODENOSCOPY);  Surgeon: Onita Elspeth Sharper, DO;  Location: [facility] ENDOSCOPY;  Service: Gastroenterology;  Laterality: N/A;   FINGER SURGERY Right    Middle finger joint replaced   JOINT REPLACEMENT     MAXIMUM ACCESS (MAS) TRANSFORAMINAL LUMBAR INTERBODY FUSION (TLIF) 2 LEVEL Right 07/06/2020   Procedure: L4/5, L5/S1 TRANSFORAMINAL LUMBAR INTERBODY FUSION (TLIF), L4-S1 PEDICLE SCREWS,DECOMPRESSION ;  Surgeon: Bluford Elspeth, MD;  Location: [facility];  Service: Neurosurgery;  Laterality: Right;   NASAL SINUS SURGERY     POLYPECTOMY  11/23/2023   Procedure: POLYPECTOMY, INTESTINE;  Surgeon: Onita Elspeth Sharper, DO;  Location: [facility] ENDOSCOPY;  Service: Gastroenterology;;   THUMB SURGERY Right    took tendon from forearm and wrapped it around the thumb joint    FAMILY HISTORY: family history includes Alzheimer's disease in her father; Anuerysm in her mother; Breast cancer in her maternal aunt; Developmental delay in her brother; Heart attack in her father; Stroke in her mother.  SOCIAL HISTORY:  reports that she has never smoked. She has been exposed to tobacco smoke. She has never used smokeless tobacco. She reports current alcohol  use. She reports that she does not use drugs.  ALLERGIES: Levofloxacin and Other  MEDICATIONS:  Current Outpatient Medications  Medication Sig Dispense Refill   acetaminophen  (TYLENOL ) 650 MG CR tablet  Take 1 tablet (650 mg total) by mouth every 8 (eight) hours as needed for pain. 30 tablet 0   albuterol  (VENTOLIN  HFA) 108 (90 Base) MCG/ACT inhaler Inhale 2 puffs into the lungs every 6 (six) hours as needed for wheezing or shortness of breath. 8 g 2   amoxicillin -clavulanate (AUGMENTIN ) 875-125 MG tablet Take 1 tablet by mouth every 12 (twelve) hours. 14 tablet 0   aspirin  EC 81 MG tablet Take 1 tablet (81 mg total) by mouth daily. Swallow whole. 90 tablet 1   azelastine  (ASTELIN ) 0.1 % nasal spray USE 2 SPRAYS IN EACH NOSTRIL TWICE A DAY AS DIRECTED 90 mL 3   buPROPion  (WELLBUTRIN  SR) 200 MG 12 hr tablet Take 200 mg by mouth 2 (two) times daily.     busPIRone  (BUSPAR ) 15 MG tablet Take 1 tablet (15 mg total) by mouth 2 (two) times daily. 180 tablet 3   Calcium  Carbonate-Vit D-Min (CALTRATE 600+D PLUS MINIS PO) Take 2 tablets by mouth every morning.     diltiazem  (CARDIZEM  CD) 240 MG 24 hr capsule Take 240 mg by mouth daily with lunch.     DULoxetine  HCl 40 MG CPEP Take 40 mg by mouth every morning.     fluticasone -salmeterol (WIXELA INHUB) 100-50 MCG/ACT AEPB Inhale 1 puff into the lungs 2 (two) times daily. 180 each 3   gabapentin  (NEURONTIN ) 100 MG capsule Take 1 capsule (100 mg total) by mouth daily as needed.     Homeopathic Products (ARNICARE EX) Apply 1 Application topically daily.     ibuprofen  (ADVIL ) 800  MG tablet Take 1 tablet (800 mg total) by mouth every 8 (eight) hours as needed for mild pain (pain score 1-3) or moderate pain (pain score 4-6). 30 tablet 0   levothyroxine  (SYNTHROID ) 50 MCG tablet Take 1-1.5 tablets (50-75 mcg total) by mouth See admin instructions. Take 75 mg every  Monday, Wednesday and Friday Take 50 mg all the other days 90 tablet 3   linaclotide (LINZESS) 290 MCG CAPS capsule Take 290 mcg by mouth daily before breakfast.     liothyronine  (CYTOMEL ) 5 MCG tablet Take 0.5 tablets (2.5 mcg total) by mouth daily. (Patient taking differently: Take 2.5 mcg by mouth every  morning.) 45 tablet 3   montelukast  (SINGULAIR ) 10 MG tablet Take 1 tablet (10 mg total) by mouth at bedtime. (Patient taking differently: Take 5 mg by mouth at bedtime.) 90 tablet 3   Multiple Vitamin (MULTIVITAMIN) tablet Take 1 tablet by mouth daily. Vita Fusion adult Gummie 90 tablet 3   oxyCODONE -acetaminophen  (PERCOCET) 5-325 MG tablet Take 1 tablet by mouth every 8 (eight) hours as needed for severe pain (pain score 7-10). 6 tablet 0   pantoprazole  (PROTONIX ) 40 MG tablet Take 1 tablet (40 mg total) by mouth every morning. 30 tablet 0   Peppermint Oil (IBGARD PO) Take 1 capsule by mouth 2 (two) times daily with a meal.     Polyethyl Glycol-Propyl Glycol (SYSTANE) 0.4-0.3 % GEL ophthalmic gel Place 1 application  into both eyes at bedtime.     polyethylene glycol powder (GLYCOLAX /MIRALAX ) 17 GM/SCOOP powder Take 17 g by mouth daily. 255 g 3   pravastatin  (PRAVACHOL ) 40 MG tablet Take 1 tablet (40 mg total) by mouth daily. (Patient taking differently: Take 40 mg by mouth at bedtime.) 180 tablet 3   predniSONE  (STERAPRED UNI-PAK 21 TAB) 10 MG (21) TBPK tablet Take by mouth daily. Take 6 tabs by mouth daily  for 1 days, then 5 tabs for 1 days, then 4 tabs for 1 days, then 3 tabs for 1 days, 2 tabs for 1 days, then 1 tab by mouth daily for 1 days 21 tablet 0   traZODone  (DESYREL ) 50 MG tablet Take 2 tablets (100 mg total) by mouth at bedtime. 180 tablet 3   White Petrolatum-Mineral Oil (LUBRICANT PM) OINT Apply 1 g to eye at bedtime.     No current facility-administered medications for this encounter.    ECOG PERFORMANCE STATUS:  0 - Asymptomatic  REVIEW OF SYSTEMS: Remote history of Hodgkin's disease treated with systemic chemotherapy as well as thoracic radiation therapy Patient denies any weight loss, fatigue, weakness, fever, chills or night sweats. Patient denies any loss of vision, blurred vision. Patient denies any ringing  of the ears or hearing loss. No irregular heartbeat. Patient  denies heart murmur or history of fainting. Patient denies any chest pain or pain radiating to her upper extremities. Patient denies any shortness of breath, difficulty breathing at night, cough or hemoptysis. Patient denies any swelling in the lower legs. Patient denies any nausea vomiting, vomiting of blood, or coffee ground material in the vomitus. Patient denies any stomach pain. Patient states has had normal bowel movements no significant constipation or diarrhea. Patient denies any dysuria, hematuria or significant nocturia. Patient denies any problems walking, swelling in the joints or loss of balance. Patient denies any skin changes, loss of hair or loss of weight. Patient denies any excessive worrying or anxiety or significant depression. Patient denies any problems with insomnia. Patient denies excessive thirst, polyuria, polydipsia.  Patient denies any swollen glands, patient denies easy bruising or easy bleeding. Patient denies any recent infections, allergies or URI. Patient s visual fields have not changed significantly in recent time.   PHYSICAL EXAM: BP (!) 142/69   Pulse 98   Temp (!) 97 F (36.1 C)   Resp 15   Ht 5' 1.5 (1.562 m)   Wt 146 lb (66.2 kg)   BMI 27.14 kg/m  Left breast is somewhat swollen no evidence of mastitis at this time.  No dominant masses noted in either breast no axillary or supraclavicular adenopathy is identified all incisions are well-healed.  Well-developed well-nourished patient in NAD. HEENT reveals PERLA, EOMI, discs not visualized.  Oral cavity is clear. No oral mucosal lesions are identified. Neck is clear without evidence of cervical or supraclavicular adenopathy. Lungs are clear to A&P. Cardiac examination is essentially unremarkable with regular rate and rhythm without murmur rub or thrill. Abdomen is benign with no organomegaly or masses noted. Motor sensory and DTR levels are equal and symmetric in the upper and lower extremities. Cranial nerves II  through XII are grossly intact. Proprioception is intact. No peripheral adenopathy or edema is identified. No motor or sensory levels are noted. Crude visual fields are within normal range.  LABORATORY DATA: Pathology reports reviewed    RADIOLOGY RESULTS: Mammogram and ultrasound reviewed compatible with above-stated findings   IMPRESSION: Stage Ia invasive lobular carcinoma the left breast status post wide local excision and sentinel node biopsy ER/PR positive in 73 year old female with prior history of Hodgkin's disease and systemic as well as radiation treatments to her thorax  PLAN: At this time based on her previous history of radiation to her chest I have opted for accelerated partial breast irradiation.  I would plan delivery 35 Gray in 10 fractions using IMRT treatment planning and delivery.  I would choose IMRT to spare critical structures such as any previously involved areas of radiation to her chest spare her heart and lungs.  Risks and benefits of treatment occluding extreme low side effect profile possibly fatigue possibly skin reaction all were reviewed with the patient.  I personally set up and ordered CT simulation after the Christmas holiday to allow some further resolution of her breast changes.  She is seeing medical oncology today should she be candidate for systemic treatment we will sequence radiation after chemotherapy.  Patient also will be candidate for endocrine therapy after completion of treatment.  Patient comprehends my recommendations well.  I would like to take this opportunity to thank you for allowing me to participate in the care of your patient.SABRA Marcey Penton, MD

## 2024-04-23 ENCOUNTER — Non-Acute Institutional Stay: Admitting: Internal Medicine

## 2024-04-23 ENCOUNTER — Encounter: Payer: Self-pay | Admitting: Internal Medicine

## 2024-04-23 VITALS — BP 138/86 | HR 96 | Temp 97.0°F | Ht 61.5 in | Wt 144.2 lb

## 2024-04-23 DIAGNOSIS — G8929 Other chronic pain: Secondary | ICD-10-CM | POA: Diagnosis not present

## 2024-04-23 DIAGNOSIS — M25561 Pain in right knee: Secondary | ICD-10-CM | POA: Diagnosis not present

## 2024-04-23 MED ORDER — PREDNISONE 20 MG PO TABS: 40.0000 mg | ORAL_TABLET | Freq: Every day | ORAL | 0 refills | Status: AC

## 2024-04-23 MED ORDER — AMOXICILLIN-POT CLAVULANATE 875-125 MG PO TABS: 1.0000 | ORAL_TABLET | Freq: Two times a day (BID) | ORAL | 0 refills | Status: AC

## 2024-04-23 NOTE — Progress Notes (Signed)
 [name] Outpatient Progress Note      Careteam: Patient Care Team: Laurence Locus, DO as PCP - General (Internal Medicine) Andrea Noble, OD (Optometry) Andrea Shasta POUR, RN as Oncology Nurse Navigator Andrea Noble, Andrea PARAS, MD as Consulting Physician (Oncology) Lenn Aran, MD as Consulting Physician (Radiation Oncology) PLACE OF SERVICE: [facility]   Advanced Directive information Does Patient Have a Medical Advance Directive?: Yes, Type of Advance Directive: Healthcare Power of [place];Living will, Does patient want to make changes to medical advance directive?: No - Patient declined   Allergies[1]   Chief Complaint  Patient presents with   Medical Management of Chronic Issues    Medical Management of Chronic Issues. Wants to discuss Breast Cancer and Radiation treatments and Postponing Knee surgery.      HPI: Patient is a 73 y.o. female seen in today in [facility] outpatient clinic.  Discussed the use of AI scribe software for clinical note transcription with the patient, who gave verbal consent to proceed.  History of Present Illness   Andrea Noble is a 73 year old female who presents with concerns about sinus infection and knee pain.  Sinus symptoms - Sinus infection since recent hospital discharge - No improvement after treatment with Amoxiclav, Medrol Dosepak, and ibuprofen  prescribed at urgent care - Difficulty blowing nose - No nasal discharge - Throbbing headache when bending over - Concern about CPAP machine cleanliness as a possible contributor to sinus symptoms  Arthralgia and joint pain - Significant pain in knee and left arm due to arthritis - Recent knee injection yesterday; previous injection in May - Radiation treatment for knee arthritis discussed with providers - Surgical intervention for left arm arthritis discussed  Hot flashes - Experiencing hot flashes attributed to discontinuation of estrogen therapy  Post-surgical breast  changes - Absorbable sutures in breast from recent surgery - Swelling and redness at surgical site, now decreased  Medication use - Currently taking pravastatin , ibuprofen , and Tylenol  - Ibuprofen  taken at night - Tylenol  taken in the morning and at night  Obstructive sleep apnea - Uses CPAP machine for sleep apnea  Glycemic control - Previous A1c slightly elevated - Recent steroid use may affect A1c levels       Review of Systems: Review of Systems  Constitutional:  Positive for malaise/fatigue.  HENT:  Positive for congestion and sinus pain. Negative for ear discharge.   Eyes: Negative.   Respiratory: Negative.    Cardiovascular: Negative.   Gastrointestinal: Negative.   Genitourinary: Negative.   Musculoskeletal:  Positive for back pain, joint pain and myalgias.  Skin: Negative.   Neurological:  Positive for headaches.  Endo/Heme/Allergies: Negative.   Psychiatric/Behavioral: Negative.    All other systems reviewed and are negative.    Past Medical History:  Diagnosis Date   ADHD (attention deficit hyperactivity disorder)    Anxiety    Arthritis    Asthma    Atherosclerosis of aorta    Cataract    Chronic constipation    Chronic insomnia    Complication of anesthesia    delayed emergence x1   Depression    Diverticulitis    GAD (generalized anxiety disorder)    GERD (gastroesophageal reflux disease)    Hodgkin's lymphoma (HCC) 2006   underwent radiation and chemo, last CT scan since moving to [place] from [place] 2018   Hyperlipidemia    Hypertension    Hypothyroidism    IBS (irritable bowel syndrome)    Invasive ductal carcinoma of breast,  female, left [facility])    Lumbar radiculopathy    Melanoma (HCC)    Menopausal syndrome    OAB (overactive bladder)    OSA on CPAP    Palpitations    Paresthesia    Pneumonia    PSVT (paroxysmal supraventricular tachycardia)    Seasonal allergies    Past Surgical History:  Procedure Laterality Date   AXILLARY SENTINEL  NODE BIOPSY Left 03/26/2024   Procedure: BIOPSY, LYMPH NODE, SENTINEL, AXILLARY;  Surgeon: Tye Millet, DO;  Location: [facility] ORS;  Service: General;  Laterality: Left;   BACK SURGERY     basal cell carcinoma surgery     BLEPHAROPLASTY Bilateral    BREAST BIOPSY Right    benign   BREAST BIOPSY  07/27/2018   us  core bx venus COLUMNAR CELL METAPLASIA, AND PSEUDOANGIOMATOUS STROMAL   BREAST BIOPSY Left 03/07/2024   US  LT BREAST BX W LOC DEV 1ST LESION IMG BX SPEC US  GUIDE 03/07/2024 [facility]   BREAST BIOPSY Left 03/20/2024   US  LT BREAST SAVI/RF TAG 1ST LESION US  GUIDE 03/20/2024 [facility]   BREAST LUMPECTOMY WITH RADIO FREQUENCY LOCALIZER Left 03/26/2024   Procedure: BREAST LUMPECTOMY WITH RADIO FREQUENCY LOCALIZER;  Surgeon: Tye Millet, DO;  Location: [facility] ORS;  Service: General;  Laterality: Left;  with radiofrequency tag   CARPAL TUNNEL RELEASE Right    CARPAL TUNNEL RELEASE Left    CATARACT EXTRACTION Bilateral 2015   CLEFT LIP REPAIR     COLONOSCOPY N/A 11/23/2023   Procedure: COLONOSCOPY;  Surgeon: Onita Elspeth Sharper, DO;  Location: [facility] ENDOSCOPY;  Service: Gastroenterology;  Laterality: N/A;  DM   COLONOSCOPY WITH PROPOFOL  N/A 07/09/2018   Procedure: COLONOSCOPY WITH PROPOFOL ;  Surgeon: Gaylyn Gladis PENNER, MD;  Location: [facility] ENDOSCOPY;  Service: Endoscopy;  Laterality: N/A;   DE QUERVAIN'S RELEASE Bilateral    DIAGNOSTIC LAPAROSCOPY     endometriosis   ESOPHAGOGASTRODUODENOSCOPY N/A 07/09/2018   Procedure: ESOPHAGOGASTRODUODENOSCOPY (EGD);  Surgeon: Gaylyn Gladis PENNER, MD;  Location: [facility] ENDOSCOPY;  Service: Endoscopy;  Laterality: N/A;   ESOPHAGOGASTRODUODENOSCOPY N/A 11/23/2023   Procedure: EGD (ESOPHAGOGASTRODUODENOSCOPY);  Surgeon: Onita Elspeth Sharper, DO;  Location: [facility] ENDOSCOPY;  Service: Gastroenterology;  Laterality: N/A;   FINGER SURGERY Right    Middle finger joint replaced   JOINT REPLACEMENT     MAXIMUM ACCESS (MAS) TRANSFORAMINAL LUMBAR  INTERBODY FUSION (TLIF) 2 LEVEL Right 07/06/2020   Procedure: L4/5, L5/S1 TRANSFORAMINAL LUMBAR INTERBODY FUSION (TLIF), L4-S1 PEDICLE SCREWS,DECOMPRESSION ;  Surgeon: Bluford Elspeth, MD;  Location: [facility] ORS;  Service: Neurosurgery;  Laterality: Right;   NASAL SINUS SURGERY     POLYPECTOMY  11/23/2023   Procedure: POLYPECTOMY, INTESTINE;  Surgeon: Onita Elspeth Sharper, DO;  Location: [facility] ENDOSCOPY;  Service: Gastroenterology;;   THUMB SURGERY Right    took tendon from forearm and wrapped it around the thumb joint   Social History:  reports that she has never smoked. She has been exposed to tobacco smoke. She has never used smokeless tobacco. She reports current alcohol  use. She reports that she does not use drugs.   Family History  Problem Relation Age of Onset   Breast cancer Maternal Aunt    Anuerysm Mother    Stroke Mother    Heart attack Father    Alzheimer's disease Father    Developmental delay Brother      Medications: Patient's Medications  New Prescriptions   PREDNISONE  (DELTASONE ) 20 MG TABLET    Take 2 tablets (40 mg total) by mouth daily with breakfast for 7  days.  Previous Medications   ACETAMINOPHEN  (TYLENOL ) 650 MG CR TABLET    Take 1 tablet (650 mg total) by mouth every 8 (eight) hours as needed for pain.   ALBUTEROL  (VENTOLIN  HFA) 108 (90 BASE) MCG/ACT INHALER    Inhale 2 puffs into the lungs every 6 (six) hours as needed for wheezing or shortness of breath.   ASPIRIN  EC 81 MG TABLET    Take 1 tablet (81 mg total) by mouth daily. Swallow whole.   AZELASTINE  (ASTELIN ) 0.1 % NASAL SPRAY    USE 2 SPRAYS IN EACH NOSTRIL TWICE A DAY AS DIRECTED   BUPROPION  (WELLBUTRIN  SR) 200 MG 12 HR TABLET    Take 200 mg by mouth 2 (two) times daily.   BUSPIRONE  (BUSPAR ) 15 MG TABLET    Take 1 tablet (15 mg total) by mouth 2 (two) times daily.   CALCIUM  CARBONATE-VIT D-MIN (CALTRATE 600+D PLUS MINIS PO)    Take 2 tablets by mouth every morning.   DILTIAZEM  (CARDIZEM  CD) 240 MG 24 HR  CAPSULE    Take 240 mg by mouth daily with lunch.   DULOXETINE  HCL 40 MG CPEP    Take 40 mg by mouth every morning.   FLUTICASONE -SALMETEROL (WIXELA INHUB) 100-50 MCG/ACT AEPB    Inhale 1 puff into the lungs 2 (two) times daily.   GABAPENTIN  (NEURONTIN ) 100 MG CAPSULE    Take 1 capsule (100 mg total) by mouth daily as needed.   HOMEOPATHIC PRODUCTS (ARNICARE EX)    Apply 1 Application topically daily.   IBUPROFEN  (ADVIL ) 800 MG TABLET    Take 1 tablet (800 mg total) by mouth every 8 (eight) hours as needed for mild pain (pain score 1-3) or moderate pain (pain score 4-6).   LEVOTHYROXINE  (SYNTHROID ) 50 MCG TABLET    Take 1-1.5 tablets (50-75 mcg total) by mouth See admin instructions. Take 75 mg every  Monday, Wednesday and Friday Take 50 mg all the other days   LINACLOTIDE (LINZESS) 290 MCG CAPS CAPSULE    Take 290 mcg by mouth daily before breakfast.   LIOTHYRONINE  (CYTOMEL ) 5 MCG TABLET    Take 0.5 tablets (2.5 mcg total) by mouth daily.   MONTELUKAST  (SINGULAIR ) 10 MG TABLET    Take 1 tablet (10 mg total) by mouth at bedtime.   MULTIPLE VITAMIN (MULTIVITAMIN) TABLET    Take 1 tablet by mouth daily. Vita Fusion adult Gummie   OXYCODONE -ACETAMINOPHEN  (PERCOCET) 5-325 MG TABLET    Take 1 tablet by mouth every 8 (eight) hours as needed for severe pain (pain score 7-10).   PANTOPRAZOLE  (PROTONIX ) 40 MG TABLET    Take 1 tablet (40 mg total) by mouth every morning.   PEPPERMINT OIL (IBGARD PO)    Take 1 capsule by mouth 2 (two) times daily with a meal.   POLYETHYL GLYCOL-PROPYL GLYCOL (SYSTANE) 0.4-0.3 % GEL OPHTHALMIC GEL    Place 1 application  into both eyes at bedtime.   POLYETHYLENE GLYCOL POWDER (GLYCOLAX /MIRALAX ) 17 GM/SCOOP POWDER    Take 17 g by mouth daily.   PRAVASTATIN  (PRAVACHOL ) 40 MG TABLET    Take 1 tablet (40 mg total) by mouth daily.   PREDNISONE  (STERAPRED UNI-PAK 21 TAB) 10 MG (21) TBPK TABLET    Take by mouth daily. Take 6 tabs by mouth daily  for 1 days, then 5 tabs for 1 days,  then 4 tabs for 1 days, then 3 tabs for 1 days, 2 tabs for 1 days, then 1 tab by mouth  daily for 1 days   TRAZODONE  (DESYREL ) 50 MG TABLET    Take 2 tablets (100 mg total) by mouth at bedtime.   WHITE PETROLATUM-MINERAL OIL (LUBRICANT PM) OINT    Apply 1 g to eye at bedtime.  Modified Medications   Modified Medication Previous Medication   AMOXICILLIN -CLAVULANATE (AUGMENTIN ) 875-125 MG TABLET amoxicillin -clavulanate (AUGMENTIN ) 875-125 MG tablet      Take 1 tablet by mouth every 12 (twelve) hours for 7 days.    Take 1 tablet by mouth every 12 (twelve) hours.  Discontinued Medications   No medications on file     Physical Exam:   Vitals:   04/23/24 1258  BP: 138/86  Pulse: 96  Temp: (!) 97 F (36.1 C)  SpO2: 99%  Weight: 144 lb 3.2 oz (65.4 kg)  Height: 5' 1.5 (1.562 m)   Body mass index is 26.81 kg/m. Wt Readings from Last 3 Encounters:  04/23/24 144 lb 3.2 oz (65.4 kg)  04/17/24 146 lb (66.2 kg)  04/17/24 146 lb (66.2 kg)     Physical Exam Vitals and nursing note reviewed.  Constitutional:      General: She is not in acute distress.    Appearance: She is not toxic-appearing.  HENT:     Head: Normocephalic and atraumatic.     Right Ear: Tympanic membrane normal.     Left Ear: Tympanic membrane normal.  Cardiovascular:     Rate and Rhythm: Normal rate and regular rhythm.  Pulmonary:     Effort: Pulmonary effort is normal.     Breath sounds: Normal breath sounds.  Chest:    Abdominal:     General: Bowel sounds are normal. There is no distension.     Palpations: Abdomen is soft.  Skin:    General: Skin is warm and dry.     Capillary Refill: Capillary refill takes less than 2 seconds.  Neurological:     Mental Status: She is alert and oriented to person, place, and time.    Physical Exam           Labs reviewed: Basic Metabolic Panel: Recent Labs    11/24/23 1844 12/17/23 1538 12/17/23 1802 02/01/24 0745  NA 135 139  --  137  K 3.7 3.4*  --  4.6   CL 103 103  --  101  CO2 23 25  --  28  GLUCOSE 111* 119*  --  92  BUN 13 13  --  12  CREATININE 0.59 1.04*  --  0.69  CALCIUM  8.9 9.2  --  9.6  MG  --   --  2.5*  --    Liver Function Tests: Recent Labs    11/24/23 1844  AST 28  ALT 24  ALKPHOS 118  BILITOT 0.5  PROT 6.9  ALBUMIN 3.8    CBC: Recent Labs    11/24/23 1844 12/17/23 1538  WBC 9.7 5.8  HGB 12.4 11.8*  HCT 37.4 36.4  MCV 92.6 93.6  PLT 381 320   Lipid Panel: Recent Labs    10/02/23 0748  CHOL 194  HDL 88  LDLCALC 87  TRIG 92  CHOLHDL 2.2   A1C: Lab Results  Component Value Date   HGBA1C 5.7 (H) 10/02/2023      Assessment/Plan Assessment and Plan    Acute sinusitis Persistent symptoms despite initial treatment with amoxicillin  and prednisone . Symptoms include swelling and difficulty clearing nasal passages. CPAP machine maintenance may be contributing to symptoms. - Prescribed prednisone  for 7  days, to be taken in the morning. - Prescribed amoxicillin  for 7 days, to be sent to pharmacy. - Advised on proper cleaning of CPAP machine to prevent infection.  Right knee osteoarthritis Chronic right knee osteoarthritis with recent injections. Discussion about potential use of low-dose radiation therapy as an alternative to steroid injections, though not standard of care. She has been advised to consult with orthopedics for further evaluation. - Referred to orthopedics for evaluation of low-dose radiation therapy versus steroid injections for knee osteoarthritis. - Referred to physical therapy for knee pain management.  Chronic low back pain Recent injections. Scheduled for additional injections next week. - Continue with scheduled back injections. - Referred to physical therapy for back pain management.  Postoperative aftercare following breast surgery Postoperative care following breast surgery. Presence of absorbable sutures and SteriStrips. Swelling and redness have decreased. - Continue  to monitor for resolution of sutures and SteriStrips.  History of Hodgkin lymphoma, post-radiation Hodgkin lymphoma treated with radiation in 2008. Current radiation therapy plan adjusted based on past radiation exposure. Discussion about potential use of radiation for knee osteoarthritis, though not standard of care. - Discuss potential use of radiation for knee osteoarthritis with orthopedics.  Hyperlipidemia Managed with pravastatin . Timing of medication intake discussed. - Continue pravastatin , timing of intake is flexible.      Assessment & Plan Chronic pain of right knee  Orders:   Ambulatory referral to Physical Therapy     Meds ordered this encounter  Medications   amoxicillin -clavulanate (AUGMENTIN ) 875-125 MG tablet    Sig: Take 1 tablet by mouth every 12 (twelve) hours for 7 days.    Dispense:  14 tablet    Refill:  0   predniSONE  (DELTASONE ) 20 MG tablet    Sig: Take 2 tablets (40 mg total) by mouth daily with breakfast for 7 days.    Dispense:  14 tablet    Refill:  0    Orders Placed This Encounter  Procedures   Ambulatory referral to Physical Therapy    Referral Priority:   Routine    Referral Type:   Physical Medicine    Referral Reason:   Specialty Services Required    Requested Specialty:   Physical Therapy    Number of Visits Requested:   1    Next appt: 6 months. Check A1c at that time as long as she has been off steroids for 6 months.  Camellia Door, DO [facility] [phone]     [1]  Allergies Allergen Reactions   Levofloxacin     IV Levaquin causes burning   Other Itching    Surgical glue

## 2024-04-29 ENCOUNTER — Ambulatory Visit
Admission: RE | Admit: 2024-04-29 | Discharge: 2024-04-29 | Disposition: A | Discharge status: Home / Self Care | Source: Ambulatory Visit | Attending: Radiation Oncology | Admitting: Radiation Oncology

## 2024-04-29 ENCOUNTER — Encounter: Payer: Self-pay | Admitting: *Deleted

## 2024-04-29 DIAGNOSIS — C50512 Malignant neoplasm of lower-outer quadrant of left female breast: Secondary | ICD-10-CM | POA: Diagnosis not present

## 2024-04-29 DIAGNOSIS — Z170 Estrogen receptor positive status [ER+]: Secondary | ICD-10-CM | POA: Diagnosis not present

## 2024-04-29 DIAGNOSIS — Z510 Encounter for antineoplastic radiation therapy: Secondary | ICD-10-CM | POA: Insufficient documentation

## 2024-04-29 NOTE — Progress Notes (Signed)
 Ms. Andrea Noble is concerned about a stitch that is remaining near her scar from her lumpectomy.   She will see Dr. Tye tomorrow to evaluate.

## 2024-04-30 ENCOUNTER — Encounter: Payer: Self-pay | Admitting: Nurse Practitioner

## 2024-05-01 ENCOUNTER — Other Ambulatory Visit: Payer: Self-pay | Admitting: *Deleted

## 2024-05-01 ENCOUNTER — Encounter: Payer: Self-pay | Admitting: *Deleted

## 2024-05-01 ENCOUNTER — Encounter: Admitting: Orthopedic Surgery

## 2024-05-01 DIAGNOSIS — C50512 Malignant neoplasm of lower-outer quadrant of left female breast: Secondary | ICD-10-CM

## 2024-05-08 ENCOUNTER — Encounter: Payer: Self-pay | Admitting: *Deleted

## 2024-05-08 DIAGNOSIS — C50512 Malignant neoplasm of lower-outer quadrant of left female breast: Secondary | ICD-10-CM | POA: Insufficient documentation

## 2024-05-08 DIAGNOSIS — Z170 Estrogen receptor positive status [ER+]: Secondary | ICD-10-CM | POA: Insufficient documentation

## 2024-05-08 DIAGNOSIS — Z510 Encounter for antineoplastic radiation therapy: Secondary | ICD-10-CM | POA: Insufficient documentation

## 2024-05-08 NOTE — Progress Notes (Signed)
 Andrea Noble called to notify us  that she is having some heart issues, she thinks could be afib.   She will see her cardiologist in the morning.   She is scheduled for radiation tomorrow afternoon.  Dr. Lenn and his team made aware.

## 2024-05-09 ENCOUNTER — Ambulatory Visit
Admission: RE | Admit: 2024-05-09 | Discharge: 2024-05-09 | Disposition: A | Discharge status: Home / Self Care | Source: Ambulatory Visit | Attending: Radiation Oncology | Admitting: Radiation Oncology

## 2024-05-13 ENCOUNTER — Other Ambulatory Visit: Payer: Self-pay

## 2024-05-13 ENCOUNTER — Ambulatory Visit
Admission: RE | Admit: 2024-05-13 | Discharge: 2024-05-13 | Disposition: A | Source: Ambulatory Visit | Attending: Radiation Oncology | Admitting: Radiation Oncology

## 2024-05-13 LAB — RAD ONC ARIA SESSION SUMMARY
Course Elapsed Days: 0
Plan Fractions Treated to Date: 1
Plan Prescribed Dose Per Fraction: 3.5 Gy
Plan Total Fractions Prescribed: 10
Plan Total Prescribed Dose: 35 Gy
Reference Point Dosage Given to Date: 3.5 Gy
Reference Point Session Dosage Given: 3.5 Gy
Session Number: 1

## 2024-05-14 ENCOUNTER — Other Ambulatory Visit: Payer: Self-pay

## 2024-05-14 ENCOUNTER — Ambulatory Visit
Admission: RE | Admit: 2024-05-14 | Discharge: 2024-05-14 | Disposition: A | Source: Ambulatory Visit | Attending: Radiation Oncology | Admitting: Radiation Oncology

## 2024-05-14 LAB — RAD ONC ARIA SESSION SUMMARY
Course Elapsed Days: 1
Plan Fractions Treated to Date: 2
Plan Prescribed Dose Per Fraction: 3.5 Gy
Plan Total Fractions Prescribed: 10
Plan Total Prescribed Dose: 35 Gy
Reference Point Dosage Given to Date: 7 Gy
Reference Point Session Dosage Given: 3.5 Gy
Session Number: 2

## 2024-05-15 ENCOUNTER — Other Ambulatory Visit: Payer: Self-pay

## 2024-05-15 ENCOUNTER — Encounter: Payer: Self-pay | Admitting: *Deleted

## 2024-05-15 ENCOUNTER — Ambulatory Visit: Attending: Oncology | Admitting: Occupational Therapy

## 2024-05-15 ENCOUNTER — Ambulatory Visit
Admission: RE | Admit: 2024-05-15 | Discharge: 2024-05-15 | Disposition: A | Discharge status: Home / Self Care | Source: Ambulatory Visit | Attending: Radiation Oncology | Admitting: Radiation Oncology

## 2024-05-15 DIAGNOSIS — Z170 Estrogen receptor positive status [ER+]: Secondary | ICD-10-CM | POA: Insufficient documentation

## 2024-05-15 DIAGNOSIS — Z9889 Other specified postprocedural states: Secondary | ICD-10-CM | POA: Insufficient documentation

## 2024-05-15 DIAGNOSIS — C50912 Malignant neoplasm of unspecified site of left female breast: Secondary | ICD-10-CM

## 2024-05-15 DIAGNOSIS — C50412 Malignant neoplasm of upper-outer quadrant of left female breast: Secondary | ICD-10-CM | POA: Insufficient documentation

## 2024-05-15 DIAGNOSIS — M25612 Stiffness of left shoulder, not elsewhere classified: Secondary | ICD-10-CM | POA: Insufficient documentation

## 2024-05-15 DIAGNOSIS — N63 Unspecified lump in unspecified breast: Secondary | ICD-10-CM | POA: Insufficient documentation

## 2024-05-15 DIAGNOSIS — L905 Scar conditions and fibrosis of skin: Secondary | ICD-10-CM | POA: Insufficient documentation

## 2024-05-15 LAB — RAD ONC ARIA SESSION SUMMARY
Course Elapsed Days: 2
Plan Fractions Treated to Date: 3
Plan Prescribed Dose Per Fraction: 3.5 Gy
Plan Total Fractions Prescribed: 10
Plan Total Prescribed Dose: 35 Gy
Reference Point Dosage Given to Date: 10.5 Gy
Reference Point Session Dosage Given: 3.5 Gy
Session Number: 3

## 2024-05-15 NOTE — Therapy (Signed)
 " OUTPATIENT OCCUPATIONAL THERAPY BREAST CANCER POSTOP  TREATMENT   Patient Name: Andrea Noble MRN: [id number] DOB:Sep 29, 1950, 74 y.o., female Today's Date: 05/15/2024  END OF SESSION:  OT End of Session - 05/15/24 1654     Visit Number 2    Number of Visits 6    Date for Recertification  06/12/24    OT Start Time 1335    OT Stop Time 1403    OT Time Calculation (min) 28 min    Activity Tolerance Patient tolerated treatment well    Behavior During Therapy WFL for tasks assessed/performed          Past Medical History:  Diagnosis Date   ADHD (attention deficit hyperactivity disorder)    Anxiety    Arthritis    Asthma    Atherosclerosis of aorta    Cataract    Chronic constipation    Chronic insomnia    Complication of anesthesia    delayed emergence x1   Depression    Diverticulitis    GAD (generalized anxiety disorder)    GERD (gastroesophageal reflux disease)    Hodgkin's lymphoma (HCC) 2006   underwent radiation and chemo, last CT scan since moving to [place] from [place] 2018   Hyperlipidemia    Hypertension    Hypothyroidism    IBS (irritable bowel syndrome)    Invasive ductal carcinoma of breast, female, left (HCC)    Lumbar radiculopathy    Melanoma (HCC)    Menopausal syndrome    OAB (overactive bladder)    OSA on CPAP    Palpitations    Paresthesia    Pneumonia    PSVT (paroxysmal supraventricular tachycardia)    Seasonal allergies    Past Surgical History:  Procedure Laterality Date   AXILLARY SENTINEL NODE BIOPSY Left 03/26/2024   Procedure: BIOPSY, LYMPH NODE, SENTINEL, AXILLARY;  Surgeon: Tye Millet, DO;  Location: [facility];  Service: General;  Laterality: Left;   BACK SURGERY     basal cell carcinoma surgery     BLEPHAROPLASTY Bilateral    BREAST BIOPSY Right    benign   BREAST BIOPSY  07/27/2018   us  core bx venus COLUMNAR CELL METAPLASIA, AND PSEUDOANGIOMATOUS STROMAL   BREAST BIOPSY Left 03/07/2024   US  LT BREAST BX W LOC DEV 1ST LESION IMG  BX SPEC US  GUIDE 03/07/2024 [facility]   BREAST BIOPSY Left 03/20/2024   US  LT BREAST SAVI/RF TAG 1ST LESION US  GUIDE 03/20/2024 [facility]   BREAST LUMPECTOMY WITH RADIO FREQUENCY LOCALIZER Left 03/26/2024   Procedure: BREAST LUMPECTOMY WITH RADIO FREQUENCY LOCALIZER;  Surgeon: Tye Millet, DO;  Location: [facility];  Service: General;  Laterality: Left;  with radiofrequency tag   CARPAL TUNNEL RELEASE Right    CARPAL TUNNEL RELEASE Left    CATARACT EXTRACTION Bilateral 2015   CLEFT LIP REPAIR     COLONOSCOPY N/A 11/23/2023   Procedure: COLONOSCOPY;  Surgeon: Onita Elspeth Sharper, DO;  Location: [facility] ENDOSCOPY;  Service: Gastroenterology;  Laterality: N/A;  DM   COLONOSCOPY WITH PROPOFOL  N/A 07/09/2018   Procedure: COLONOSCOPY WITH PROPOFOL ;  Surgeon: Gaylyn Gladis PENNER, MD;  Location: [facility] ENDOSCOPY;  Service: Endoscopy;  Laterality: N/A;   DE QUERVAIN'S RELEASE Bilateral    DIAGNOSTIC LAPAROSCOPY     endometriosis   ESOPHAGOGASTRODUODENOSCOPY N/A 07/09/2018   Procedure: ESOPHAGOGASTRODUODENOSCOPY (EGD);  Surgeon: Gaylyn Gladis PENNER, MD;  Location: [facility] ENDOSCOPY;  Service: Endoscopy;  Laterality: N/A;   ESOPHAGOGASTRODUODENOSCOPY N/A 11/23/2023   Procedure: EGD (ESOPHAGOGASTRODUODENOSCOPY);  Surgeon: Onita Elspeth Sharper, DO;  Location: [facility] ENDOSCOPY;  Service: Gastroenterology;  Laterality: N/A;   FINGER SURGERY Right    Middle finger joint replaced   JOINT REPLACEMENT     MAXIMUM ACCESS (MAS) TRANSFORAMINAL LUMBAR INTERBODY FUSION (TLIF) 2 LEVEL Right 07/06/2020   Procedure: L4/5, L5/S1 TRANSFORAMINAL LUMBAR INTERBODY FUSION (TLIF), L4-S1 PEDICLE SCREWS,DECOMPRESSION ;  Surgeon: Bluford Standing, MD;  Location: [facility];  Service: Neurosurgery;  Laterality: Right;   NASAL SINUS SURGERY     POLYPECTOMY  11/23/2023   Procedure: POLYPECTOMY, INTESTINE;  Surgeon: Onita Standing Sharper, DO;  Location: [facility] ENDOSCOPY;  Service: Gastroenterology;;   THUMB SURGERY Right    took  tendon from forearm and wrapped it around the thumb joint   Patient Active Problem List   Diagnosis Date Noted   Irritable bowel syndrome with predominant constipation 03/20/2024   MCI (mild cognitive impairment) with memory loss 03/20/2024   Invasive ductal carcinoma of left breast (HCC) 03/15/2024   Low back pain 10/25/2023   Diabetes due to undrl condition w oth diabetic neuro comp (HCC) 09/16/2023   OSA (obstructive sleep apnea) 01/20/2023   Arthralgia of right knee 11/14/2022   Arthritis of right hip 11/14/2022   Arthritis of right knee 11/14/2022   Pain of right hip joint 11/14/2022   Acquired hypothyroidism 02/04/2022   Essential hypertension 03/24/2021   Preventative health care 02/03/2021   Mood disorder 02/03/2021   Atherosclerosis of aorta 02/03/2021   Intermittent asthma, well controlled 07/31/2020   Menopausal syndrome    OAB (overactive bladder)    Seasonal allergies    Lumbar radiculopathy 07/06/2020   Chronic shoulder bursitis, right 10/14/2019   Primary osteoarthritis of both hands 04/30/2018   Postmenopausal 04/30/2018   Arthralgia 12/12/2017   GAD (generalized anxiety disorder) 12/12/2017   Chronic constipation 11/13/2017   Asthma, stable, moderate persistent 05/15/2017   Chronic insomnia 05/15/2017   Hot flashes, menopausal 05/15/2017   Mixed hyperlipidemia 03/14/2017   Gastroesophageal reflux disease without esophagitis 03/14/2017   MDD (major depressive disorder), recurrent episode, moderate (HCC) 03/14/2017   History of Hodgkin's lymphoma 03/01/2017    PCP: Dr Laurence  REFERRING PROVIDER: Dr Jacobo  REFERRING DIAG: L lumpectomy  THERAPY DIAG:  Swelling of breast  S/P breast lumpectomy  Scar condition and fibrosis of skin  Stiffness of left shoulder, not elsewhere classified  Rationale for Evaluation and Treatment: Rehabilitation  ONSET DATE: 03/26/24  SUBJECTIVE:                                                                                                                                                                                            SUBJECTIVE STATEMENT: I  did see Dr. Tye.  But he said if it was the stitches will dissolve.  You can check and see but I do not think it is there anymore.  I started radiation.  I feel like the swelling my breast is better.  But I wanted to show me some exercises again.  PERTINENT HISTORY:  Patient had left lumpectomy on 03/26/2024 by Dr. Tye.  Meeting with Dr. Camelia today about radiation.  And also seeing Dr. Jacobo.  Patient report increased swelling in the left breast thinks is mastitis. 05/15/2024 update.  Third session of radiation  PATIENT GOALS:   reduce lymphedema risk and learn post op HEP.   PAIN:  Are you having pain? 2-4/10 tenderness left breast  PRECAUTIONS: Active CA , left UE lymphedema breast     HAND DOMINANCE: right  WEIGHT BEARING RESTRICTIONS: No  FALLS:  Has patient fallen in last 6 months? No  LIVING ENVIRONMENT: Patient lives with: Spouse at [facility] and LEISURE: Patient is over trying hospital chaplain and pastor-she likes to read and done in the past physical therapy multiple times over [facility].  Patient uses a rollator to walk   OBJECTIVE:  COGNITION: Overall cognitive status: Within functional limits for tasks assessed    POSTURE:  Rounded shoulders posture  UPPER EXTREMITY AROM/PROM:  Active range of motion in bilateral shoulders within functional limits.  Do report some pain in multiple joints in the areas of the body.  CERVICAL AROM: All within normal limits:     UPPER EXTREMITY STRENGTH: 4+/5 for bilateral shoulders  LYMPHEDEMA ASSESSMENTS:       L-DEX LYMPHEDEMA SCREENING:  The patient was assessed using the L-Dex machine today to produce a lymphedema index baseline score. The patient will be reassessed on a regular basis (typically every 3 months) to obtain new L-Dex scores. If the score is >  6.5 points away from his/her baseline score indicating onset of subclinical lymphedema, it will be recommended to wear a compression garment for 4 weeks, 12 hours per day and then be reassessed. If the score continues to be > 6.5 points from baseline at reassessment, we will initiate lymphedema treatment. Assessing in this manner has a 95% rate of preventing clinically significant lymphedema.   L-DEX FLOWSHEETS - 05/15/24 1600       L-DEX LYMPHEDEMA SCREENING   Measurement Type Unilateral    L-DEX MEASUREMENT EXTREMITY Upper Extremity    POSITION  Standing    DOMINANT SIDE Right    At Risk Side Left    BASELINE SCORE (UNILATERAL) 13.1    L-DEX SCORE (UNILATERAL) 11.8    VALUE CHANGE (UNILAT) -1.3        L-Dex score within normal range Session today: Patient presented with increased active range of motion in left shoulder abduction flexion and external rotation.  Limited in endrange. Upgrade patient assistive range of motion on the wall or doorframe for shoulder flexion bilateral and abduction in the doorway decreasing risk for positioning into flexion.  10 reps each 2-3 times a day Continue with external rotation in supine Patient to continue with his active assisted range of motion even after radiation. Patient scar tissue improved greatly as well as no fibrosis on lateral left breast.  Swelling appeared to be back to normal. Did review with patient signs and symptoms as well as prevention and precautions for lymphedema.  Patient has handout from evaluation.  Did recommend and discussed with patient if doing long flights to get over-the-counter compression  sleeve and glove.  Reviewed with patient wearing. Husband present during session Patient L-Dex score within normal range.    PATIENT EDUCATION:  Education details: Lymphedema risk reduction and post op shoulder/posture HEP Person educated: Patient Education method: Explanation, Demonstration, Handout Education comprehension:  Patient verbalized understanding and returned demonstration   ASSESSMENT:  CLINICAL IMPRESSION: Patient seen by OT for diagnosis of left lumpectomy on 03/26/2024 by Dr. Tye.  Bilateral shoulder active range of motion is within functional limits.  Patient denies any pain in the shoulder or limitations.  Since seen last time patient swelling in left breast decreased back to normal.  Scar tissue and fibrosis improved greatly.  Patient L-Dex score within normal range.  Shoulder active range of motion improved greatly continue to be decreased in endrange.  Upgrade patient's home exercises to active assistive range of motion on the wall doorframe for shoulder flexion abduction.  As well as to continue with external rotation.  Patient can continue with home program even after radiation is done.  Patient can continue to see breast navigator forL-Dex screens every 3 months for 2 years to detect subclinical lymphedema.  Pt will benefit from skilled therapeutic intervention to improve on the following deficits: Decreased knowledge of precautions and lymphedema education, impaired UE functional use, pain, decreased ROM, postural dysfunction.   OT treatment/interventions: ADL/self-care home management, pt/family education, therapeutic exercise,manual therapy  REHAB POTENTIAL: Good  CLINICAL DECISION MAKING: Stable/uncomplicated  EVALUATION COMPLEXITY: Low   GOALS: Goals reviewed with patient? YES  LONG TERM GOALS: (STG=LTG)    Name Target Date Goal status  1 Pt will be able to verbalize understanding of pertinent lymphedema risk reduction practices relevant to her dx specifically related to skin care.  Baseline:  No knowledge 8 weeks Met  2 Pt will be able to return demo and/or verbalize understanding of the post op HEP related to regaining shoulder ROM. Baseline:  No knowledge Today Achieved at eval  3.  Patient to be independent in the home program to decrease swelling and tenderness in the left   Breast BASELINE: Tenderness 2-4/10 of the left breast.  Increase swelling and tenderness.  Patient has a stitch left in the incision close to the nipple.  Patient was educated in scar massage.  At the scar and there is a lot. 8 weeks Met  4 Pt will demo she has regained full shoulder ROM and function post operatively compared to baselines.  Baseline: See objective measurements taken today. 8 weeks Met    PLAN:  OT FREQUENCY/DURATION: EVAL and 5 sessions  PLAN FOR NEXT SESSION:   Occupational Therapy Information for After Breast Cancer Surgery/Treatment:  Lymphedema is a swelling condition that you may be at risk for in your arm if you have lymph nodes removed from the armpit area.  After a sentinel node biopsy, the risk is approximately 5-9% and is higher after an axillary node dissection.  There is treatment available for this condition and it is not life-threatening.  Contact your physician or occupational therapist with concerns. You may begin the 4 shoulder/posture exercises (see additional sheet) when permitted by your physician (typically a week after surgery).  If you have drains, you may need to wait until those are removed before beginning range of motion exercises.  A general recommendation is to not lift your arms above shoulder height until drains are removed.  These exercises should be done to your tolerance and gently.  This is not a no pain/no gain type of recovery so listen  to your body and stretch into the range of motion that you can tolerate, stopping if you have pain.  If you are having immediate reconstruction, ask your plastic surgeon about doing exercises as he or she may want you to wait. .  While undergoing any medical procedure or treatment, try to avoid blood pressure being taken or needle sticks from occurring on the arm on the side of cancer.   This recommendation begins after surgery and continues for the rest of your life.  This may help reduce your risk of getting  lymphedema (swelling in your arm). An excellent resource for those seeking information on lymphedema is the National Lymphedema Network's web site. It can be accessed at www.lymphnet.org If you notice swelling in your hand, arm or breast at any time following surgery (even if it is many years from now), please contact your doctor or occupational therapist to discuss this.  Lymphedema can be treated at any time but it is easier for you if it is treated early on.  If you feel like your shoulder motion is not returning to normal in a reasonable amount of time, please contact your surgeon or occupational therapist.  [facility] [phone]. [address] [place], [place] [address]       Ancel Peters, OTR/L,CLT 05/15/2024, 4:57 PM   "

## 2024-05-15 NOTE — Progress Notes (Signed)
 Ms. Terra called to inquire about genetic testing.  Referral has been entered.

## 2024-05-16 ENCOUNTER — Ambulatory Visit
Admission: RE | Admit: 2024-05-16 | Discharge: 2024-05-16 | Disposition: A | Source: Ambulatory Visit | Attending: Radiation Oncology | Admitting: Radiation Oncology

## 2024-05-16 ENCOUNTER — Inpatient Hospital Stay

## 2024-05-16 ENCOUNTER — Other Ambulatory Visit: Payer: Self-pay

## 2024-05-16 ENCOUNTER — Encounter: Payer: Self-pay | Admitting: Oncology

## 2024-05-16 DIAGNOSIS — C50512 Malignant neoplasm of lower-outer quadrant of left female breast: Secondary | ICD-10-CM

## 2024-05-16 LAB — CBC (CANCER CENTER ONLY)
HCT: 35.8 % — ABNORMAL LOW (ref 36.0–46.0)
Hemoglobin: 11.7 g/dL — ABNORMAL LOW (ref 12.0–15.0)
MCH: 30.3 pg (ref 26.0–34.0)
MCHC: 32.7 g/dL (ref 30.0–36.0)
MCV: 92.7 fL (ref 80.0–100.0)
Platelet Count: 227 K/uL (ref 150–400)
RBC: 3.86 MIL/uL — ABNORMAL LOW (ref 3.87–5.11)
RDW: 15.7 % — ABNORMAL HIGH (ref 11.5–15.5)
WBC Count: 8.5 K/uL (ref 4.0–10.5)
nRBC: 0 % (ref 0.0–0.2)

## 2024-05-16 LAB — RAD ONC ARIA SESSION SUMMARY
Course Elapsed Days: 3
Plan Fractions Treated to Date: 4
Plan Prescribed Dose Per Fraction: 3.5 Gy
Plan Total Fractions Prescribed: 10
Plan Total Prescribed Dose: 35 Gy
Reference Point Dosage Given to Date: 14 Gy
Reference Point Session Dosage Given: 3.5 Gy
Session Number: 4

## 2024-05-17 ENCOUNTER — Ambulatory Visit
Admission: RE | Admit: 2024-05-17 | Discharge: 2024-05-17 | Disposition: A | Discharge status: Home / Self Care | Source: Ambulatory Visit | Attending: Radiation Oncology | Admitting: Radiation Oncology

## 2024-05-17 ENCOUNTER — Other Ambulatory Visit: Payer: Self-pay

## 2024-05-17 LAB — RAD ONC ARIA SESSION SUMMARY
Course Elapsed Days: 4
Plan Fractions Treated to Date: 5
Plan Prescribed Dose Per Fraction: 3.5 Gy
Plan Total Fractions Prescribed: 10
Plan Total Prescribed Dose: 35 Gy
Reference Point Dosage Given to Date: 17.5 Gy
Reference Point Session Dosage Given: 3.5 Gy
Session Number: 5

## 2024-05-20 ENCOUNTER — Other Ambulatory Visit: Payer: Self-pay

## 2024-05-20 ENCOUNTER — Ambulatory Visit
Admission: RE | Admit: 2024-05-20 | Discharge: 2024-05-20 | Disposition: A | Source: Ambulatory Visit | Attending: Radiation Oncology | Admitting: Radiation Oncology

## 2024-05-20 LAB — RAD ONC ARIA SESSION SUMMARY
Course Elapsed Days: 7
Plan Fractions Treated to Date: 6
Plan Prescribed Dose Per Fraction: 3.5 Gy
Plan Total Fractions Prescribed: 10
Plan Total Prescribed Dose: 35 Gy
Reference Point Dosage Given to Date: 21 Gy
Reference Point Session Dosage Given: 3.5 Gy
Session Number: 6

## 2024-05-21 ENCOUNTER — Other Ambulatory Visit: Payer: Self-pay

## 2024-05-21 ENCOUNTER — Ambulatory Visit
Admission: RE | Admit: 2024-05-21 | Discharge: 2024-05-21 | Disposition: A | Source: Ambulatory Visit | Attending: Radiation Oncology | Admitting: Radiation Oncology

## 2024-05-21 LAB — RAD ONC ARIA SESSION SUMMARY
Course Elapsed Days: 8
Plan Fractions Treated to Date: 7
Plan Prescribed Dose Per Fraction: 3.5 Gy
Plan Total Fractions Prescribed: 10
Plan Total Prescribed Dose: 35 Gy
Reference Point Dosage Given to Date: 24.5 Gy
Reference Point Session Dosage Given: 3.5 Gy
Session Number: 7

## 2024-05-22 ENCOUNTER — Other Ambulatory Visit: Payer: Self-pay

## 2024-05-22 ENCOUNTER — Ambulatory Visit
Admission: RE | Admit: 2024-05-22 | Discharge: 2024-05-22 | Disposition: A | Source: Ambulatory Visit | Attending: Radiation Oncology | Admitting: Radiation Oncology

## 2024-05-22 LAB — RAD ONC ARIA SESSION SUMMARY
Course Elapsed Days: 9
Plan Fractions Treated to Date: 8
Plan Prescribed Dose Per Fraction: 3.5 Gy
Plan Total Fractions Prescribed: 10
Plan Total Prescribed Dose: 35 Gy
Reference Point Dosage Given to Date: 28 Gy
Reference Point Session Dosage Given: 3.5 Gy
Session Number: 8

## 2024-05-23 ENCOUNTER — Ambulatory Visit
Admission: RE | Admit: 2024-05-23 | Discharge: 2024-05-23 | Disposition: A | Discharge status: Home / Self Care | Source: Ambulatory Visit | Attending: Radiation Oncology | Admitting: Radiation Oncology

## 2024-05-23 ENCOUNTER — Other Ambulatory Visit: Payer: Self-pay

## 2024-05-23 LAB — RAD ONC ARIA SESSION SUMMARY
Course Elapsed Days: 10
Plan Fractions Treated to Date: 9
Plan Prescribed Dose Per Fraction: 3.5 Gy
Plan Total Fractions Prescribed: 10
Plan Total Prescribed Dose: 35 Gy
Reference Point Dosage Given to Date: 31.5 Gy
Reference Point Session Dosage Given: 3.5 Gy
Session Number: 9

## 2024-05-24 ENCOUNTER — Other Ambulatory Visit: Payer: Self-pay | Admitting: Neurology

## 2024-05-24 ENCOUNTER — Ambulatory Visit
Admission: RE | Admit: 2024-05-24 | Discharge: 2024-05-24 | Disposition: A | Source: Ambulatory Visit | Attending: Radiation Oncology | Admitting: Radiation Oncology

## 2024-05-24 ENCOUNTER — Encounter: Payer: Self-pay | Admitting: *Deleted

## 2024-05-24 ENCOUNTER — Other Ambulatory Visit: Payer: Self-pay

## 2024-05-24 DIAGNOSIS — R413 Other amnesia: Secondary | ICD-10-CM

## 2024-05-24 LAB — RAD ONC ARIA SESSION SUMMARY
Course Elapsed Days: 11
Plan Fractions Treated to Date: 10
Plan Prescribed Dose Per Fraction: 3.5 Gy
Plan Total Fractions Prescribed: 10
Plan Total Prescribed Dose: 35 Gy
Reference Point Dosage Given to Date: 35 Gy
Reference Point Session Dosage Given: 3.5 Gy
Session Number: 10

## 2024-05-25 ENCOUNTER — Ambulatory Visit
Admission: RE | Admit: 2024-05-25 | Discharge: 2024-05-25 | Disposition: A | Source: Ambulatory Visit | Attending: Neurology | Admitting: Neurology

## 2024-05-25 DIAGNOSIS — R413 Other amnesia: Secondary | ICD-10-CM | POA: Insufficient documentation

## 2024-05-27 ENCOUNTER — Inpatient Hospital Stay: Admitting: Oncology

## 2024-05-27 NOTE — Radiation Completion Notes (Signed)
 Patient Name: Andrea Noble, ZIMNY MRN: [id number] Date of Birth: [birth year] Referring Physician: GREIG CLUSTER, M.D. Date of Service: 2024-05-27 Radiation Oncologist: Marcey Penton, M.D. [facility] -                              RADIATION ONCOLOGY END OF TREATMENT NOTE     Diagnosis: C50.512 Malignant neoplasm of lower-outer quadrant of left female breast Staging on 2024-03-15: Invasive ductal carcinoma of left breast (HCC) T=cT1b, N=cN0, M=cM0 Intent: Curative     HPI: Patient is a 74 year old female who presents with a self discovered mass in her left breast.  Mammogram confirmed a highly suspicious mass measuring 1 cm at the 3 o'clock position with architectural distortion.  No left axillar lymphadenopathy seen on ultrasound.  Right breast was within normal limits.  She underwent ultrasound-guided biopsy showing invasive mammary carcinoma.  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  Margins were clear at 3 mm.  2 sentinel lymph nodes were examined negative for metastatic disease.  Tumor was ER/PR positive HER2/neu not overexpressed.  Postoperatively she is done fairly well while she does apparently have a seroma and some swelling of the left breast.  Oncotype DX was performed with a score of recurrence risk 20.  She is seen today for consideration of postoperative radiation therapy.  She is having problems with her right knee and states she will probably need a knee replacement in the future.  Patient does have a remote history of Hodgkin's disease reason rereceiving both chemotherapy as well as thoracic radiation therapy.  We will be trying to locate those records.      ==========DELIVERED PLANS==========  First Treatment Date: 2024-05-13 Last Treatment Date: 2024-05-24   Plan Name: Breast_L_APBI Site: Breast, Left Technique: IMRT Mode: Photon Dose Per Fraction: 3.5 Gy Prescribed Dose (Delivered / Prescribed): 35 Gy  / 35 Gy Prescribed Fxs (Delivered / Prescribed): 10 / 10     ==========ON TREATMENT VISIT DATES========== 2024-05-21     ==========UPCOMING VISITS========== 08/15/2024 [facility] ON[facility] SOZO SCREEN Georgina Shasta POUR, RN  06/27/2024 [facility] RAD ONCOLOGY FOLLOW UP 30 Penton Marcey, MD  05/29/2024 [facility] EST PT Jacobo Evalene PARAS, MD  05/29/2024 [facility] LAB [facility]  05/29/2024 [facility] ON[facility] GENETIC COUNSEL Allyn Rattan T        ==========APPENDIX - ON TREATMENT VISIT NOTES==========   See weekly On Treatment Notes in Epic for details in the Media tab (listed as Progress notes on the On Treatment Visit Dates listed above).

## 2024-05-28 ENCOUNTER — Inpatient Hospital Stay: Admitting: Oncology

## 2024-05-29 ENCOUNTER — Encounter: Payer: Self-pay | Admitting: Oncology

## 2024-05-29 ENCOUNTER — Encounter: Payer: Self-pay | Admitting: *Deleted

## 2024-05-29 ENCOUNTER — Inpatient Hospital Stay

## 2024-05-29 ENCOUNTER — Other Ambulatory Visit: Payer: Self-pay | Admitting: Licensed Clinical Social Worker

## 2024-05-29 ENCOUNTER — Encounter: Payer: Self-pay | Admitting: Licensed Clinical Social Worker

## 2024-05-29 ENCOUNTER — Inpatient Hospital Stay: Admitting: Oncology

## 2024-05-29 ENCOUNTER — Inpatient Hospital Stay: Admitting: Licensed Clinical Social Worker

## 2024-05-29 VITALS — BP 137/86 | HR 110 | Temp 97.8°F | Resp 18 | Ht 61.5 in | Wt 146.0 lb

## 2024-05-29 DIAGNOSIS — C50912 Malignant neoplasm of unspecified site of left female breast: Secondary | ICD-10-CM

## 2024-05-29 DIAGNOSIS — Z8571 Personal history of Hodgkin lymphoma: Secondary | ICD-10-CM

## 2024-05-29 DIAGNOSIS — Z79811 Long term (current) use of aromatase inhibitors: Secondary | ICD-10-CM

## 2024-05-29 DIAGNOSIS — Z803 Family history of malignant neoplasm of breast: Secondary | ICD-10-CM

## 2024-05-29 DIAGNOSIS — Z1379 Encounter for other screening for genetic and chromosomal anomalies: Secondary | ICD-10-CM

## 2024-05-29 DIAGNOSIS — Z85820 Personal history of malignant melanoma of skin: Secondary | ICD-10-CM

## 2024-05-29 LAB — GENETIC SCREENING ORDER

## 2024-05-29 MED ORDER — LETROZOLE 2.5 MG PO TABS: 2.5000 mg | ORAL_TABLET | Freq: Every day | ORAL | 3 refills | Status: AC

## 2024-05-29 NOTE — Progress Notes (Signed)
 " [facility]  [phone] [phone]  ID: Andrea Andrea OB: 16-Apr-1951  MR#: [id number]  RDW#:[id number]  Patient Care Team: Andrea Locus, DO as PCP - General (Internal Medicine) Andrea Andrea, OD (Optometry) Andrea Shasta POUR, RN as Oncology Nurse Navigator Andrea Andrea, Andrea PARAS, MD as Consulting Physician (Oncology) Andrea Aran, MD as Consulting Physician (Radiation Oncology)  CHIEF COMPLAINT: Pathologic stage Ia ER/PR positive, HER2 negative invasive lobular carcinoma of the left breast.  Oncotype DX score low risk 20.  INTERVAL HISTORY: Patient returns to clinic today for further evaluation and initiation of letrozole .  She recently completed XRT and tolerated treatment well.  She currently feels well and is asymptomatic.  She has no neurologic complaints.  She denies any recent fevers or illnesses.  She has a good appetite and denies weight loss.  She has no chest pain, shortness of breath, cough, or hemoptysis.  She denies any nausea, vomiting, constipation, or diarrhea.  She has no urinary complaints.  Patient offers no specific complaints today.  REVIEW OF SYSTEMS:   Review of Systems  Constitutional: Negative.  Negative for fever, malaise/fatigue and weight loss.  Respiratory: Negative.  Negative for cough, hemoptysis and shortness of breath.   Cardiovascular: Negative.  Negative for chest pain and leg swelling.  Gastrointestinal: Negative.  Negative for abdominal pain.  Genitourinary: Negative.  Negative for dysuria.  Musculoskeletal: Negative.  Negative for back pain.  Skin: Negative.  Negative for rash.  Neurological: Negative.  Negative for dizziness, focal weakness, weakness and headaches.  Psychiatric/Behavioral: Negative.  The patient is not nervous/anxious.     As per HPI. Otherwise, a complete review of systems is negative.  PAST MEDICAL HISTORY: Past Medical History:  Diagnosis Date   ADHD (attention deficit hyperactivity  disorder)    Anxiety    Arthritis    Asthma    Atherosclerosis of aorta    Cataract    Chronic constipation    Chronic insomnia    Complication of anesthesia    delayed emergence x1   Depression    Diverticulitis    GAD (generalized anxiety disorder)    GERD (gastroesophageal reflux disease)    Hodgkin's lymphoma (HCC) 2006   underwent radiation and chemo, last CT scan since moving to [place] from [place] 2018   Hyperlipidemia    Hypertension    Hypothyroidism    IBS (irritable bowel syndrome)    Invasive ductal carcinoma of breast, female, left (HCC)    Lumbar radiculopathy    Melanoma (HCC)    Menopausal syndrome    OAB (overactive bladder)    OSA on CPAP    Palpitations    Paresthesia    Pneumonia    PSVT (paroxysmal supraventricular tachycardia)    Seasonal allergies     PAST SURGICAL HISTORY: Past Surgical History:  Procedure Laterality Date   AXILLARY SENTINEL NODE BIOPSY Left 03/26/2024   Procedure: BIOPSY, LYMPH NODE, SENTINEL, AXILLARY;  Surgeon: Andrea Millet, DO;  Location: [facility] ORS;  Service: General;  Laterality: Left;   BACK SURGERY     basal cell carcinoma surgery     BLEPHAROPLASTY Bilateral    BREAST BIOPSY Right    benign   BREAST BIOPSY  07/27/2018   us  core bx venus COLUMNAR CELL METAPLASIA, AND PSEUDOANGIOMATOUS STROMAL   BREAST BIOPSY Left 03/07/2024   US  LT BREAST BX W LOC DEV 1ST LESION IMG BX SPEC US  GUIDE 03/07/2024 [facility]   BREAST BIOPSY Left 03/20/2024   US  LT BREAST SAVI/RF  TAG 1ST LESION US  GUIDE 03/20/2024 [facility]   BREAST LUMPECTOMY WITH RADIO FREQUENCY LOCALIZER Left 03/26/2024   Procedure: BREAST LUMPECTOMY WITH RADIO FREQUENCY LOCALIZER;  Surgeon: Andrea Millet, DO;  Location: [facility];  Service: General;  Laterality: Left;  with radiofrequency tag   CARPAL TUNNEL RELEASE Right    CARPAL TUNNEL RELEASE Left    CATARACT EXTRACTION Bilateral 2015   CLEFT LIP REPAIR     COLONOSCOPY N/A 11/23/2023   Procedure: COLONOSCOPY;   Surgeon: Andrea Andrea Sharper, DO;  Location: [facility] ENDOSCOPY;  Service: Gastroenterology;  Laterality: N/A;  DM   COLONOSCOPY WITH PROPOFOL  N/A 07/09/2018   Procedure: COLONOSCOPY WITH PROPOFOL ;  Surgeon: Andrea Gladis PENNER, MD;  Location: [facility] ENDOSCOPY;  Service: Endoscopy;  Laterality: N/A;   DE QUERVAIN'S RELEASE Bilateral    DIAGNOSTIC LAPAROSCOPY     endometriosis   ESOPHAGOGASTRODUODENOSCOPY N/A 07/09/2018   Procedure: ESOPHAGOGASTRODUODENOSCOPY (EGD);  Surgeon: Andrea Gladis PENNER, MD;  Location[facility];  Service: Endoscopy;  Laterality: N/A;   ESOPHAGOGASTRODUODENOSCOPY N/A 11/23/2023   Procedure: EGD (ESOPHAGOGASTRODUODENOSCOPY);  Surgeon: Andrea Andrea Sharper, DO;  Location: [facility] ENDOSCOPY;  Service: Gastroenterology;  Laterality: N/A;   FINGER SURGERY Right    Middle finger joint replaced   JOINT REPLACEMENT     MAXIMUM ACCESS (MAS) TRANSFORAMINAL LUMBAR INTERBODY FUSION (TLIF) 2 LEVEL Right 07/06/2020   Procedure: L4/5, L5/S1 TRANSFORAMINAL LUMBAR INTERBODY FUSION (TLIF), L4-S1 PEDICLE SCREWS,DECOMPRESSION ;  Surgeon: Andrea Elspeth, MD;  Location: [facility];  Service: Neurosurgery;  Laterality: Right;   NASAL SINUS SURGERY     POLYPECTOMY  11/23/2023   Procedure: POLYPECTOMY, INTESTINE;  Surgeon: Andrea Andrea Sharper, DO;  Location: [facility] ENDOSCOPY;  Service: Gastroenterology;;   THUMB SURGERY Right    took tendon from forearm and wrapped it around the thumb joint    FAMILY HISTORY: Family History  Problem Relation Age of Onset   Breast cancer Maternal Aunt    Andrea Andrea    Stroke Andrea    Heart attack Andrea    Andrea Andrea    Andrea Andrea     ADVANCED DIRECTIVES (Y/N):  N  HEALTH MAINTENANCE: Social History   Tobacco Use   Smoking status: Never    Passive exposure: Past   Smokeless tobacco: Never  Vaping Use   Vaping status: Never Used  Substance Use Topics   Alcohol  use: Yes    Comment: very occasional once a week    Drug use: No     Colonoscopy:  PAP:  Bone density:  Lipid panel:  Allergies  Allergen Reactions   Levofloxacin     IV Levaquin causes burning   Other Itching    Surgical glue    Current Outpatient Medications  Medication Sig Dispense Refill   acetaminophen  (TYLENOL ) 650 MG CR tablet Take 1 tablet (650 mg total) by mouth every 8 (eight) hours as needed for pain. 30 tablet 0   albuterol  (VENTOLIN  HFA) 108 (90 Base) MCG/ACT inhaler Inhale 2 puffs into the lungs every 6 (six) hours as needed for wheezing or shortness of breath. 8 g 2   aspirin  EC 81 MG tablet Take 1 tablet (81 mg total) by mouth daily. Swallow whole. 90 tablet 1   azelastine  (ASTELIN ) 0.1 % nasal spray USE 2 SPRAYS IN EACH NOSTRIL TWICE A DAY AS DIRECTED 90 mL 3   buPROPion  (WELLBUTRIN  SR) 200 MG 12 hr tablet Take 200 mg by mouth 2 (two) times daily.     busPIRone  (BUSPAR ) 15 MG tablet Take  1 tablet (15 mg total) by mouth 2 (two) times daily. 180 tablet 3   Calcium  Carbonate-Vit D-Min (CALTRATE 600+D PLUS MINIS PO) Take 2 tablets by mouth every morning.     diltiazem  (CARDIZEM  CD) 240 MG 24 hr capsule Take 240 mg by mouth daily with lunch.     DULoxetine  HCl 40 MG CPEP Take 40 mg by mouth every morning.     fluticasone -salmeterol (WIXELA INHUB) 100-50 MCG/ACT AEPB Inhale 1 puff into the lungs 2 (two) times daily. 180 each 3   gabapentin  (NEURONTIN ) 100 MG capsule Take 1 capsule (100 mg total) by mouth daily as needed.     Homeopathic Products (ARNICARE EX) Apply 1 Application topically daily.     levothyroxine  (SYNTHROID ) 50 MCG tablet Take 1-1.5 tablets (50-75 mcg total) by mouth See admin instructions. Take 75 mg every  Monday, Wednesday and Friday Take 50 mg all the other days 90 tablet 3   liothyronine  (CYTOMEL ) 5 MCG tablet Take 0.5 tablets (2.5 mcg total) by mouth daily. (Patient taking differently: Take 2.5 mcg by mouth every morning.) 45 tablet 3   montelukast  (SINGULAIR ) 10 MG tablet Take 1 tablet (10 mg  total) by mouth at bedtime. (Patient taking differently: Take 5 mg by mouth at bedtime.) 90 tablet 3   Multiple Vitamin (MULTIVITAMIN) tablet Take 1 tablet by mouth daily. Vita Fusion adult Gummie 90 tablet 3   pantoprazole  (PROTONIX ) 40 MG tablet Take 1 tablet (40 mg total) by mouth every morning. 30 tablet 0   Peppermint Oil (IBGARD PO) Take 1 capsule by mouth 2 (two) times daily with a meal.     Polyethyl Glycol-Propyl Glycol (SYSTANE) 0.4-0.3 % GEL ophthalmic gel Place 1 application  into both eyes at bedtime.     polyethylene glycol powder (GLYCOLAX /MIRALAX ) 17 GM/SCOOP powder Take 17 g by mouth daily. 255 g 3   pravastatin  (PRAVACHOL ) 40 MG tablet Take 1 tablet (40 mg total) by mouth daily. (Patient taking differently: Take 40 mg by mouth at bedtime.) 180 tablet 3   traZODone  (DESYREL ) 50 MG tablet Take 2 tablets (100 mg total) by mouth at bedtime. 180 tablet 3   vitamin B-12 (CYANOCOBALAMIN) 100 MCG tablet Take 100 mcg by mouth daily.     White Petrolatum-Mineral Oil (LUBRICANT PM) OINT Apply 1 g to eye at bedtime.     No current facility-administered medications for this visit.    OBJECTIVE: Vitals:   05/29/24 1033  BP: 137/86  Pulse: (!) 110  Resp: 18  Temp: 97.8 F (36.6 C)  SpO2: 99%     Body mass index is 27.14 kg/m.    ECOG FS:0 - Asymptomatic  General: Well-developed, well-nourished, no acute distress. Eyes: Pink conjunctiva, anicteric sclera. HEENT: Normocephalic, moist mucous membranes. Lungs: No audible wheezing or coughing. Heart: Regular rate and rhythm. Abdomen: Soft, nontender, no obvious distention. Musculoskeletal: No edema, cyanosis, or clubbing. Neuro: Alert, answering all questions appropriately. Cranial nerves grossly intact. Skin: No rashes or petechiae noted. Psych: Normal affect.  LAB RESULTS:  Lab Results  Component Value Date   NA 137 02/01/2024   K 4.6 02/01/2024   CL 101 02/01/2024   CO2 28 02/01/2024   GLUCOSE 92 02/01/2024   BUN 12  02/01/2024   CREATININE 0.69 02/01/2024   CALCIUM  9.6 02/01/2024   PROT 6.9 11/24/2023   ALBUMIN 3.8 11/24/2023   AST 28 11/24/2023   ALT 24 11/24/2023   ALKPHOS 118 11/24/2023   BILITOT 0.5 11/24/2023   GFRNONAA 58 (L)  12/17/2023   GFRAA >60 06/23/2018    Lab Results  Component Value Date   WBC 8.5 05/16/2024   NEUTROABS 4.4 03/06/2017   HGB 11.7 (L) 05/16/2024   HCT 35.8 (L) 05/16/2024   MCV 92.7 05/16/2024   PLT 227 05/16/2024     STUDIES: MR BRAIN WO CONTRAST Result Date: 05/28/2024 EXAM: MRI BRAIN WITHOUT CONTRAST 05/25/2024 01:34:31 PM TECHNIQUE: Multiplanar multisequence MRI of the head/brain was performed without the administration of intravenous contrast. COMPARISON: Head CT 07/31/2017. CLINICAL HISTORY: Memory impairment, fall 10 years ago, pain on left side of head for a few months. FINDINGS: BRAIN AND VENTRICLES: There is no evidence of an acute infarct, intracranial hemorrhage, mass, midline shift, hydrocephalus, or extra-axial fluid collection. Patchy T2 hyperintensities in the subcortical and deep cerebral white matter bilaterally are nonspecific but compatible with moderate chronic small vessel ischemic disease. There is central predominant cerebral atrophy of a mild to moderate degree. Major intracranial vascular flow voids are preserved. ORBITS: Bilateral cataract extraction. SINUSES AND MASTOIDS: No significant abnormality. BONES AND SOFT TISSUES: Normal marrow signal. No soft tissue abnormality. IMPRESSION: 1. No acute intracranial abnormality. 2. Moderate chronic small vessel ischemic disease and cerebral atrophy. Electronically signed by: Dasie Hamburg MD 05/28/2024 09:36 AM EST RP Workstation: [id number]    ASSESSMENT: Pathologic stage Ia ER/PR positive, HER2 negative invasive lobular carcinoma of the left breast.  Oncotype DX score low risk 20  PLAN:    Pathologic stage Ia ER/PR positive, HER2 negative invasive lobular carcinoma of the left breast.  Oncotype DX  score low risk 20: Patient underwent lumpectomy on March 26, 2024 revealed the above-stated malignancy.  Given the low risk Oncotype score, she did not require adjuvant chemotherapy.  Patient completed adjuvant XRT on May 24, 2024.  She was given a prescription for letrozole  today and recommended to take for 5 years completing in January 2031.  No further intervention is needed.  Return to clinic in 3 months for further evaluation. Genetics: Patient has an appointment with genetic counseling later this morning. Bone health: Will get baseline bone marrow density in the next 1 to 2 weeks. Neurology: Patient reports she is actively being worked up for possible Andrea.  MRI results of the brain as above.    Patient expressed understanding and was in agreement with this plan. She also understands that She can call clinic at any time with any questions, concerns, or complaints.    Cancer Staging  Invasive ductal carcinoma of left breast [facility]) Staging form: Breast, AJCC 8th Edition - Clinical stage from 03/15/2024: Stage IA (cT1b, cN0, cM0, G2, ER+, PR+, HER2-) - Signed by Andrea Andrea Andrea PARAS, MD on 03/15/2024 Stage prefix: Initial diagnosis Histologic grading system: 3 grade system   Andrea Andrea Jacobo, MD   05/29/2024 10:45 AM     "

## 2024-05-29 NOTE — Progress Notes (Signed)
 genet

## 2024-05-29 NOTE — Progress Notes (Signed)
 REFERRING PROVIDER: Jacobo Evalene PARAS, MD [address] [place],  [place] [address]  PRIMARY PROVIDER:  Laurence Locus, DO  PRIMARY REASON FOR VISIT:  No diagnosis found.   HISTORY OF PRESENT ILLNESS:   Andrea Noble, a 74 y.o. female, was seen for a [facility] cancer genetics consultation at the request of Dr. Jacobo due to a personal and family history of cancer.  Andrea Noble presents to clinic today to discuss the possibility of a hereditary predisposition to cancer, genetic testing, and to further clarify her future cancer risks, as well as potential cancer risks for family members.   CANCER HISTORY:  Oncology History  Invasive ductal carcinoma of left breast (HCC)  03/15/2024 Initial Diagnosis   Invasive ductal carcinoma of left breast (HCC)   03/15/2024 Cancer Staging   Staging form: Breast, AJCC 8th Edition - Clinical stage from 03/15/2024: Stage IA (cT1b, cN0, cM0, G2, ER+, PR+, HER2-) - Signed by Jacobo Evalene PARAS, MD on 03/15/2024 Stage prefix: Initial diagnosis Histologic grading system: 3 grade system    Andrea Noble was diagnosed with Hodgkin's lymphoma in her 36s, this was treated with radiation and chemotherapy. She has had 3-4 melanomas removed.  In 2025, at the age of 66, Andrea Noble was diagnosed with ILC of the left berast, ER/PR+, HER2-. This was treated with lumpectomy in November 2025, adjuvant radiation and adjuvant antiestrogen therapy.   RELEVANT MEDICAL HISTORY:  Ovaries intact: yes.  Hysterectomy: no.  Menopausal status: postmenopausal.  Colonoscopy: yes; possibly has had more than 10 cumulative colon polyps.   Past Medical History:  Diagnosis Date   ADHD (attention deficit hyperactivity disorder)    Anxiety    Arthritis    Asthma    Atherosclerosis of aorta    Cataract    Chronic constipation    Chronic insomnia    Complication of anesthesia    delayed emergence x1   Depression    Diverticulitis    GAD (generalized anxiety  disorder)    GERD (gastroesophageal reflux disease)    Hodgkin's lymphoma (HCC) 2006   underwent radiation and chemo, last CT scan since moving to [place] from [place] 2018   Hyperlipidemia    Hypertension    Hypothyroidism    IBS (irritable bowel syndrome)    Invasive ductal carcinoma of breast, female, left (HCC)    Lumbar radiculopathy    Melanoma (HCC)    Menopausal syndrome    OAB (overactive bladder)    OSA on CPAP    Palpitations    Paresthesia    Pneumonia    PSVT (paroxysmal supraventricular tachycardia)    Seasonal allergies     Past Surgical History:  Procedure Laterality Date   AXILLARY SENTINEL NODE BIOPSY Left 03/26/2024   Procedure: BIOPSY, LYMPH NODE, SENTINEL, AXILLARY;  Surgeon: Tye Millet, DO;  Location: [facility];  Service: General;  Laterality: Left;   BACK SURGERY     basal cell carcinoma surgery     BLEPHAROPLASTY Bilateral    BREAST BIOPSY Right    benign   BREAST BIOPSY  07/27/2018   us  core bx venus COLUMNAR CELL METAPLASIA, AND PSEUDOANGIOMATOUS STROMAL   BREAST BIOPSY Left 03/07/2024   US  LT BREAST BX W LOC DEV 1ST LESION IMG BX SPEC US  GUIDE 03/07/2024 [facility]   BREAST BIOPSY Left 03/20/2024   US  LT BREAST SAVI/RF TAG 1ST LESION US  GUIDE 03/20/2024 [facility]   BREAST LUMPECTOMY WITH RADIO FREQUENCY LOCALIZER Left 03/26/2024   Procedure: BREAST LUMPECTOMY WITH RADIO FREQUENCY LOCALIZER;  Surgeon: Tye Millet, DO;  Location: [facility];  Service: General;  Laterality: Left;  with radiofrequency tag   CARPAL TUNNEL RELEASE Right    CARPAL TUNNEL RELEASE Left    CATARACT EXTRACTION Bilateral 2015   CLEFT LIP REPAIR     COLONOSCOPY N/A 11/23/2023   Procedure: COLONOSCOPY;  Surgeon: Onita Elspeth Sharper, DO;  Location: [facility] ENDOSCOPY;  Service: Gastroenterology;  Laterality: N/A;  DM   COLONOSCOPY WITH PROPOFOL  N/A 07/09/2018   Procedure: COLONOSCOPY WITH PROPOFOL ;  Surgeon: Gaylyn Gladis PENNER, MD;  Location: [facility] ENDOSCOPY;  Service:  Endoscopy;  Laterality: N/A;   DE QUERVAIN'S RELEASE Bilateral    DIAGNOSTIC LAPAROSCOPY     endometriosis   ESOPHAGOGASTRODUODENOSCOPY N/A 07/09/2018   Procedure: ESOPHAGOGASTRODUODENOSCOPY (EGD);  Surgeon: Gaylyn Gladis PENNER, MD;  Location: [facility] ENDOSCOPY;  Service: Endoscopy;  Laterality: N/A;   ESOPHAGOGASTRODUODENOSCOPY N/A 11/23/2023   Procedure: EGD (ESOPHAGOGASTRODUODENOSCOPY);  Surgeon: Onita Elspeth Sharper, DO;  Location: [facility] ENDOSCOPY;  Service: Gastroenterology;  Laterality: N/A;   FINGER SURGERY Right    Middle finger joint replaced   JOINT REPLACEMENT     MAXIMUM ACCESS (MAS) TRANSFORAMINAL LUMBAR INTERBODY FUSION (TLIF) 2 LEVEL Right 07/06/2020   Procedure: L4/5, L5/S1 TRANSFORAMINAL LUMBAR INTERBODY FUSION (TLIF), L4-S1 PEDICLE SCREWS,DECOMPRESSION ;  Surgeon: Bluford Elspeth, MD;  Location: [facility];  Service: Neurosurgery;  Laterality: Right;   NASAL SINUS SURGERY     POLYPECTOMY  11/23/2023   Procedure: POLYPECTOMY, INTESTINE;  Surgeon: Onita Elspeth Sharper, DO;  Location: [facility] ENDOSCOPY;  Service: Gastroenterology;;   THUMB SURGERY Right    took tendon from forearm and wrapped it around the thumb joint    FAMILY HISTORY:  We obtained a detailed, 4-generation family history.  Significant diagnoses are listed below: Family History  Problem Relation Age of Onset   Anuerysm Mother    Stroke Mother    Heart attack Father    Alzheimer's disease Father    Developmental delay Brother    Breast cancer Maternal Aunt        dx >50    Maternal aunt with breast cancer under 21 Maternal uncle with cancer, unknown type Paternal uncle possibly had cancer  Andrea Noble is unaware of previous family history of genetic testing for hereditary cancer risks. There is no reported Ashkenazi Jewish ancestry. There is no known consanguinity.  GENETIC COUNSELING ASSESSMENT: Andrea Noble is a 74 y.o. female with a personal history of multiple melanomas, breast cancer and Hodgkin's  lymphoma which is somewhat suggestive of a hereditary cancer syndrome and predisposition to cancer. We, therefore, discussed and recommended the following at today's visit.   DISCUSSION: We discussed that, in general, most cancer is not inherited in families, but instead is sporadic or familial. We discussed that approximately 10% of cancer is hereditary. Most cases of hereditary breast cancer are associated with BRCA1/BRCA2 genes, although there are other genes associated with hereditary cancer as well. Cancers and risks are gene specific. We discussed that testing is beneficial for several reasons including knowing about cancer risks, identifying potential screening and risk-reduction options that may be appropriate, and to understand if other family members could be at risk for cancer and allow them to undergo genetic testing.   We reviewed the characteristics, features and inheritance patterns of hereditary cancer syndromes. We also discussed genetic testing, including the appropriate family members to test, the process of testing, insurance coverage and turn-around-time for results. We discussed the implications of a negative, positive and/or variant of uncertain significant result. We recommended  Andrea Noble pursue genetic testing for the [facility] Common Hereditary Cancers+RNA gene panel.   The Common Hereditary Cancers Panel + RNA offered by [facility] includes sequencing and/or deletion duplication testing of the following 48 genes: APC*, ATM*, AXIN2, BAP1, BARD1, BMPR1A, BRCA1, BRCA2, BRIP1, CDH1, CDK4, CDKN2A (p14ARF), CDKN2A (p16INK4a), CHEK2, CTNNA1, DICER1*, EPCAM*, FH*, GREM1*, HOXB13, KIT, MBD4, MEN1*, MLH1*, MSH2*, MSH3*, MSH6*, MUTYH, NF1*, NTHL1, PALB2, PDGFRA, PMS2*, POLD1*, POLE, PTEN*, RAD51C, RAD51D, SDHA*, SDHB, SDHC*, SDHD, SMAD4, SMARCA4, STK11, TP53, TSC1*, TSC2, VHL.   Based on Andrea Noble's personal and family history of cancer, she meets medical criteria for genetic testing.  Despite that she meets criteria, she may still have an out of pocket cost.   PLAN: After considering the risks, benefits, and limitations, Andrea Noble provided informed consent to pursue genetic testing and the blood sample was sent to [facility] for analysis of the Common Hereditary Cancers+RNA Panel. Results should be available within approximately 2-3 weeks' time, at which point they will be disclosed by telephone to Andrea Noble, as will any additional recommendations warranted by these results. Andrea Noble will receive a summary of her genetic counseling visit and a copy of her results once available. This information will also be available in Epic.   Andrea Noble questions were answered to her satisfaction today. Our contact information was provided should additional questions or concerns arise. Thank you for the referral and allowing us  to share in the care of your patient.   Dena Cary, MS, [facility] Genetic Counselor [email or web address] Phone: [phone]  I personally spent a total of 40 minutes in the care of the patient today including getting/reviewing separately obtained history, counseling and educating, placing orders, and documenting clinical information in the EHR. Dr. Delinda was available for discussion regarding this case.   _______________________________________________________________________ For Office Staff:  Number of people involved in session: 2 - patient's sister was also present.  Was an Intern/ student involved with case: no

## 2024-06-20 ENCOUNTER — Encounter: Admitting: Nurse Practitioner

## 2024-06-26 ENCOUNTER — Other Ambulatory Visit

## 2024-06-27 ENCOUNTER — Ambulatory Visit: Admitting: Radiation Oncology

## 2024-08-15 ENCOUNTER — Inpatient Hospital Stay: Admitting: *Deleted

## 2024-08-27 ENCOUNTER — Inpatient Hospital Stay: Admitting: Oncology

## 2024-10-22 ENCOUNTER — Encounter: Admitting: Nurse Practitioner
